# Patient Record
Sex: Female | Born: 1980
Health system: Southern US, Community
[De-identification: ages and names within clinical notes are randomized; demographics above are authoritative.]

## PROBLEM LIST (undated history)

## (undated) DIAGNOSIS — R0600 Dyspnea, unspecified: Secondary | ICD-10-CM

## (undated) DIAGNOSIS — I10 Essential (primary) hypertension: Secondary | ICD-10-CM

## (undated) DIAGNOSIS — K469 Unspecified abdominal hernia without obstruction or gangrene: Secondary | ICD-10-CM

## (undated) HISTORY — DX: Essential (primary) hypertension: I10

## (undated) HISTORY — PX: HERNIA REPAIR: SHX51

---

## 2005-04-29 DIAGNOSIS — G4730 Sleep apnea, unspecified: Secondary | ICD-10-CM

## 2005-04-29 HISTORY — DX: Sleep apnea, unspecified: G47.30

## 2009-12-26 ENCOUNTER — Emergency Department (HOSPITAL_COMMUNITY): Admission: EM | Admit: 2009-12-26 | Discharge: 2009-12-26 | Payer: Self-pay | Admitting: Emergency Medicine

## 2011-04-30 HISTORY — PX: HERNIA REPAIR: SHX51

## 2011-12-25 ENCOUNTER — Encounter (HOSPITAL_COMMUNITY): Payer: Self-pay | Admitting: Emergency Medicine

## 2011-12-25 ENCOUNTER — Emergency Department (HOSPITAL_COMMUNITY)
Admission: EM | Admit: 2011-12-25 | Discharge: 2011-12-25 | Disposition: A | Discharge status: Home / Self Care | Payer: Medicaid Other | Attending: Emergency Medicine | Admitting: Emergency Medicine

## 2011-12-25 DIAGNOSIS — K047 Periapical abscess without sinus: Secondary | ICD-10-CM | POA: Insufficient documentation

## 2011-12-25 MED ORDER — PENICILLIN V POTASSIUM 500 MG PO TABS: 500.0000 mg | ORAL_TABLET | Freq: Three times a day (TID) | ORAL | Status: AC

## 2011-12-25 MED ORDER — HYDROCODONE-ACETAMINOPHEN 5-325 MG PO TABS
1.0000 | ORAL_TABLET | Freq: Once | ORAL | Status: AC
  Administered 2011-12-25: 1 via ORAL
  Filled 2011-12-25: qty 1

## 2011-12-25 MED ORDER — HYDROCODONE-ACETAMINOPHEN 5-325 MG PO TABS: 2.0000 | ORAL_TABLET | ORAL | Status: DC | PRN

## 2011-12-25 NOTE — ED Notes (Signed)
Pt has right sided facial swelling due to rotten tooth in back bottom right molar; dentist appt Monday. Reports she cannot eat and has been using warm compress at home. A&Ox3.

## 2011-12-25 NOTE — ED Provider Notes (Signed)
History     CSN: [id number]  Arrival date & time 12/25/11  8295   First MD Initiated Contact with Patient 12/25/11 2116      Chief Complaint  Patient presents with  . Facial Swelling   HPI  History provided by the patient. Patient is a 31 year old African American female with no significant PMH who presents with complaints of left lower dental pain and facial swelling. Patient states she has had off-and-on problems with her dentition especially left lower molars for over the past month. She had made an appointment with a dentist in [facility] area but unfortunately missed her appointment. She does report being rescheduled for this next coming Monday. Over the past 2 days she has had increasing pain to her left jaw and face. She also reports having some swelling of the face. She denies having any difficulty swallowing or breathing. She denies any fever, chills or sweats. She denies any swelling under the common. She has been using over-the-counter pain medications and Orajel without significant improvement. She denies any other aggravating or alleviating factors.     No past medical history on file.  No past surgical history on file.  No family history on file.  History  Substance Use Topics  . Smoking status: Not on file  . Smokeless tobacco: Not on file  . Alcohol Use: Not on file    OB History    Grav Para Term Preterm Abortions TAB SAB Ect Mult Living                  Review of Systems  Constitutional: Negative for fever and chills.  HENT: Positive for sore throat and dental problem. Negative for trouble swallowing and voice change.   Gastrointestinal: Negative for nausea.    Allergies  Review of patient's allergies indicates no known allergies.  Home Medications   Current Outpatient Rx  Name Route Sig Dispense Refill  . ALBUTEROL SULFATE HFA 108 (90 BASE) MCG/ACT IN AERS Inhalation Inhale 2 puffs into the lungs every 6 (six) hours as needed. For shortness of  breath    . BENZOCAINE 10 % MT GEL Mouth/Throat Use as directed 1 application in the mouth or throat as needed. For tooth and swelling      BP 136/83  Pulse 83  Temp 98.3 F (36.8 C)  Resp 20  SpO2 98%  LMP 12/09/2011  Physical Exam  Nursing note and vitals reviewed. Constitutional: She is oriented to person, place, and time. She appears well-developed and well-nourished. No distress.  HENT:  Head: Normocephalic.  Mouth/Throat:         There are several molars throughout the mouth with decay to the gumline. There is old partial fracture to the left lower first molar with filling over the medial aspect. No significant swelling of the adjacent gum tissue or fluctuance. There is slight swelling of the left face with asymmetry.  Neck: Normal range of motion. Neck supple.  Cardiovascular: Normal rate and regular rhythm.   Pulmonary/Chest: Effort normal and breath sounds normal.  Lymphadenopathy:    She has no cervical adenopathy.  Neurological: She is alert and oriented to person, place, and time.  Skin: Skin is warm and dry. No rash noted.  Psychiatric: She has a normal mood and affect. Her behavior is normal.    ED Course  Procedures       1. Periapical abscess       MDM  9:28 PM patient seen and evaluated. Patient with slight  asymmetry and swelling of lower jaw. There is tenderness to percussion over left lower first molar. No significant swelling or fluctuance of adjacent gums or drainable abscess.        Angus Seller, [place] 12/25/11 2217

## 2011-12-25 NOTE — ED Notes (Signed)
PA at bedside.

## 2011-12-25 NOTE — ED Provider Notes (Signed)
Medical screening examination/treatment/procedure(s) were performed by non-physician practitioner and as supervising physician I was immediately available for consultation/collaboration.   Gwyneth Sprout, MD 12/25/11 2246

## 2011-12-27 ENCOUNTER — Encounter (HOSPITAL_COMMUNITY): Payer: Self-pay | Admitting: Emergency Medicine

## 2011-12-27 ENCOUNTER — Emergency Department (HOSPITAL_COMMUNITY)
Admission: EM | Admit: 2011-12-27 | Discharge: 2011-12-27 | Disposition: A | Discharge status: Home / Self Care | Payer: Medicaid Other | Attending: Emergency Medicine | Admitting: Emergency Medicine

## 2011-12-27 DIAGNOSIS — K047 Periapical abscess without sinus: Secondary | ICD-10-CM | POA: Insufficient documentation

## 2011-12-27 DIAGNOSIS — R112 Nausea with vomiting, unspecified: Secondary | ICD-10-CM | POA: Insufficient documentation

## 2011-12-27 MED ORDER — OXYCODONE-ACETAMINOPHEN 5-325 MG PO TABS
1.0000 | ORAL_TABLET | Freq: Once | ORAL | Status: AC
  Administered 2011-12-27: 1 via ORAL
  Filled 2011-12-27: qty 1

## 2011-12-27 MED ORDER — ONDANSETRON HCL 4 MG PO TABS: 4.0000 mg | ORAL_TABLET | Freq: Four times a day (QID) | ORAL | Status: AC

## 2011-12-27 NOTE — ED Notes (Signed)
Pt states she was seen in ED for tooth pain/infection 12/25/11, she was given Rx for penicillin 500mg  TID. Pt filled Rx yesterday 12/26/11 and took 2 doses, yesterday evening and last night. Pt became nauseated this AM around 0600 and vomited x1 around 0700 after eating a fruit cup. Pt concerned the medication made her vomit.

## 2011-12-27 NOTE — ED Provider Notes (Signed)
History     CSN: [id number]  Arrival date & time 12/27/11  0809   First MD Initiated Contact with Patient 12/27/11 [phone]      Chief Complaint  Patient presents with  . Medication Reaction    (Consider location/radiation/quality/duration/timing/severity/associated sxs/prior treatment) HPI  31 year old female presents for evaluations of the medication reaction. Patient was seen 2 days ago in the ED for evaluations of left lower dental pain and facial swelling. She was diagnosed with having a periapical abscess and was prescribed Norco, and penicillin with referral to a dentist, Dr. Lucky Cowboy.  Pt reports she took her antibiotic twice yesterday.  This am while eating her fruit cup pt felt nauseated and proceed to vomit one time.  Sts she notice trace of blood in her vomitus which concerns her.  Pt denies lightheadedness, dizziness, throat swelling, hives, itch, cp, sob or rash.  Has no prior hx of penicillin allergy.  Have not taken her prescribed pain medication yet.  Denies any other medication changes.  Pt concern that the medication may caused her to vomit.    No past medical history on file.  No past surgical history on file.  No family history on file.  History  Substance Use Topics  . Smoking status: Not on file  . Smokeless tobacco: Not on file  . Alcohol Use: Not on file    OB History    Grav Para Term Preterm Abortions TAB SAB Ect Mult Living                  Review of Systems  All other systems reviewed and are negative.    Allergies  Review of patient's allergies indicates no known allergies.  Home Medications   Current Outpatient Rx  Name Route Sig Dispense Refill  . ALBUTEROL SULFATE HFA 108 (90 BASE) MCG/ACT IN AERS Inhalation Inhale 2 puffs into the lungs every 6 (six) hours as needed. For shortness of breath    . BENZOCAINE 10 % MT GEL Mouth/Throat Use as directed 1 application in the mouth or throat as needed. For tooth and swelling    .  HYDROCODONE-ACETAMINOPHEN 5-325 MG PO TABS Oral Take 2 tablets by mouth every 4 (four) hours as needed for pain. 20 tablet 0  . PENICILLIN V POTASSIUM 500 MG PO TABS Oral Take 1 tablet (500 mg total) by mouth 3 (three) times daily. 30 tablet 0    BP 160/93  Pulse 83  Temp 98.5 F (36.9 C) (Oral)  Resp 20  SpO2 97%  LMP 12/09/2011  Physical Exam  Nursing note and vitals reviewed. Constitutional: She is oriented to person, place, and time. She appears well-developed and well-nourished.       Morbidly obese  HENT:  Head: Normocephalic and atraumatic.  Right Ear: External ear normal.  Left Ear: External ear normal.  Nose: Nose normal.  Mouth/Throat: Oropharynx is clear and moist.       Tenderness to left lower molar and premolar on percussion. Temporary filling noted in left molar. No significant erythema or swelling noted to gum line.    No evidence of peritonsillar abscess, or Ludwig's angina. Mild trismus but no drooling.  Left lower face is mildly edematous without fluctuance.   Neck:       Left anterior cervical lymphadenopathy, tenderness on palpation  Cardiovascular: Normal rate and regular rhythm.   Pulmonary/Chest: Effort normal and breath sounds normal. No respiratory distress. She has no wheezes. She has no rales.  Neurological: She is alert  and oriented to person, place, and time.  Skin: Skin is warm. No rash noted.  Psychiatric: She has a normal mood and affect.    ED Course  Procedures (including critical care time)  Labs Reviewed - No data to display No results found.   No diagnosis found.  1. Dental pain  MDM  Patient took 2 doses of penicillin yesterday and today she developed nausea, one bout of vomiting, and reports seeing blood in her vomitus.  She does have some evidence consistence with periapical abscess to the left lower jaw. She exhibits no obvious rash, hives, or respiratory involvement. No obvious source of bleeding noted.    Will give pain  medication. If patient is able to tolerates by mouth, will refer to dentist, Dr. Lucky Cowboy, for further management of her dental pain. I recommend taking antibiotic with food.  9:18 AM Pt able to tolerates PO, plan to have pt f/u with dentist.  Pt voice understanding and agrees with plan.    BP 160/93  Pulse 83  Temp 98.5 F (36.9 C) (Oral)  Resp 20  SpO2 97%  LMP 12/09/2011       Fayrene Helper, PA-C 12/27/11 [phone]

## 2011-12-27 NOTE — ED Provider Notes (Signed)
Medical screening examination/treatment/procedure(s) were performed by non-physician practitioner and as supervising physician I was immediately available for consultation/collaboration.  Aalivia Mcgraw L Laneice Meneely, MD 12/27/11 1647 

## 2014-10-04 ENCOUNTER — Emergency Department (HOSPITAL_COMMUNITY): Payer: Medicaid Other

## 2014-10-04 ENCOUNTER — Encounter (HOSPITAL_COMMUNITY): Payer: Self-pay | Admitting: Physical Medicine and Rehabilitation

## 2014-10-04 ENCOUNTER — Emergency Department (HOSPITAL_COMMUNITY)
Admission: EM | Admit: 2014-10-04 | Discharge: 2014-10-04 | Disposition: A | Discharge status: Home / Self Care | Payer: Medicaid Other | Attending: Emergency Medicine | Admitting: Emergency Medicine

## 2014-10-04 DIAGNOSIS — M25511 Pain in right shoulder: Secondary | ICD-10-CM

## 2014-10-04 DIAGNOSIS — Y9389 Activity, other specified: Secondary | ICD-10-CM | POA: Insufficient documentation

## 2014-10-04 DIAGNOSIS — S3992XA Unspecified injury of lower back, initial encounter: Secondary | ICD-10-CM | POA: Insufficient documentation

## 2014-10-04 DIAGNOSIS — Y998 Other external cause status: Secondary | ICD-10-CM | POA: Insufficient documentation

## 2014-10-04 DIAGNOSIS — Z79899 Other long term (current) drug therapy: Secondary | ICD-10-CM | POA: Insufficient documentation

## 2014-10-04 DIAGNOSIS — S4991XA Unspecified injury of right shoulder and upper arm, initial encounter: Secondary | ICD-10-CM | POA: Insufficient documentation

## 2014-10-04 DIAGNOSIS — Y9289 Other specified places as the place of occurrence of the external cause: Secondary | ICD-10-CM | POA: Insufficient documentation

## 2014-10-04 DIAGNOSIS — Z8719 Personal history of other diseases of the digestive system: Secondary | ICD-10-CM | POA: Insufficient documentation

## 2014-10-04 DIAGNOSIS — W010XXA Fall on same level from slipping, tripping and stumbling without subsequent striking against object, initial encounter: Secondary | ICD-10-CM | POA: Insufficient documentation

## 2014-10-04 DIAGNOSIS — Z720 Tobacco use: Secondary | ICD-10-CM | POA: Insufficient documentation

## 2014-10-04 HISTORY — DX: Unspecified abdominal hernia without obstruction or gangrene: K46.9

## 2014-10-04 MED ORDER — OXYCODONE-ACETAMINOPHEN 5-325 MG PO TABS
1.0000 | ORAL_TABLET | Freq: Once | ORAL | Status: AC
  Administered 2014-10-04: 1 via ORAL
  Filled 2014-10-04: qty 1

## 2014-10-04 MED ORDER — NAPROXEN 500 MG PO TABS: 500.0000 mg | ORAL_TABLET | Freq: Two times a day (BID) | ORAL | Status: DC

## 2014-10-04 MED ORDER — HYDROCODONE-ACETAMINOPHEN 5-325 MG PO TABS: 1.0000 | ORAL_TABLET | Freq: Four times a day (QID) | ORAL | Status: DC | PRN

## 2014-10-04 NOTE — Discharge Instructions (Signed)

## 2014-10-04 NOTE — ED Notes (Signed)
Pt A&OX4, ambulatory at d/c with steady gait, NAD 

## 2014-10-04 NOTE — ED Notes (Signed)
Pt states she accidentally tripped and fell over shoe yesterday. Now reports R shoulder pain. No obvious deformity noted. 6/10 pain upon arrival to ED.

## 2014-10-04 NOTE — ED Provider Notes (Signed)
CSN: [id number]     Arrival date & time 10/04/14  1518 History   This chart was scribed for non-physician practitioner, Felicie Mornavid Yisell Sprunger, NP working with Derwood KaplanAnkit Nanavati, MD by Gwenyth Oberatherine Macek, ED scribe. This patient was seen in room TR04C/TR04C and the patient's care was started at 4:13 PM     Chief Complaint  Patient presents with  . Shoulder Pain   The history is provided by the patient. No language interpreter was used.   HPI Comments: Victoria Chavez is a 34 y.o. female who presents to the Emergency Department complaining of constant, moderate right shoulder and clavicular pain that started yesterday. Pt states decreased ROM due to pain. She notes symptoms become worse with coughing. Pt reports that the onset of pain started after she tripped on her shoe and landed on the affected shoulder. 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 Pt denies numbness and tingling as associated symptoms.  Past Medical History  Diagnosis Date  . Abdominal hernia    History reviewed. No pertinent past surgical history. No family history on file. History  Substance Use Topics  . Smoking status: Current Every Day Smoker  . Smokeless tobacco: Not on file  . Alcohol Use: Yes   OB History    No data available     Review of Systems  Musculoskeletal: Positive for back pain and arthralgias.  Skin: Negative for wound.  Neurological: Negative for numbness.  All other systems reviewed and are negative.     Allergies  Review of patient's allergies indicates no known allergies.  Home Medications   Prior to Admission medications   Medication Sig Start Date End Date Taking? Authorizing Provider  albuterol (PROVENTIL HFA;VENTOLIN HFA) 108 (90 BASE) MCG/ACT inhaler Inhale 2 puffs into the lungs every 6 (six) hours as needed. For shortness of breath    Historical Provider, MD  ibuprofen (ADVIL,MOTRIN) 200 MG tablet Take 800 mg by mouth every 6 (six) hours as needed. For pain     Historical Provider, MD  Multiple Vitamin (MULTIVITAMIN WITH MINERALS) TABS Take 1 tablet by mouth daily.    Historical Provider, MD   BP 140/105 mmHg  Pulse 106  Temp(Src) 98.7 F (37.1 C) (Oral)  Resp 18  SpO2 94% Physical Exam  Constitutional: She appears well-developed and well-nourished. No distress.  HENT:  Head: Normocephalic and atraumatic.  Eyes: Conjunctivae and EOM are normal.  Neck: Neck supple. No tracheal deviation present.  Cardiovascular: Normal rate.   Pulmonary/Chest: Effort normal. No respiratory distress.  Musculoskeletal: She exhibits tenderness.  Anterior shoulder discomfort along with clavicular tenderness  Skin: Skin is warm and dry.  Psychiatric: She has a normal mood and affect. Her behavior is normal.  Nursing note and vitals reviewed.   ED Course  Procedures   DIAGNOSTIC STUDIES: Oxygen Saturation is 94% on RA, adequate by my interpretation.    COORDINATION OF CARE: 4:28 PM Discussed x-ray results and treatment plan with pt which includes anti-inflammatories, ice and sling for support. Pt agreed to plan.  Labs Review Labs Reviewed - No data to display  Imaging Review Dg Shoulder Right  10/04/2014   CLINICAL DATA:  Tripped over a shoe and fell yesterday, RIGHT shoulder pain  EXAM: RIGHT SHOULDER - 2+ VIEW  COMPARISON:  None  FINDINGS: Osseous mineralization normal.  AC joint alignment normal.  No acute fracture, dislocation or bone destruction.  Visualized LEFT ribs intact.  IMPRESSION: No acute osseous abnormalities.   Electronically Signed  By: Ulyses Southward M.D.   On: 10/04/2014 16:17     EKG Interpretation None     Radiology results reviewed and shared with patient.  No limitation in ROM.  Normal strength.  Distal pulses/sensation intact. MDM   Final diagnoses:  None    Shoulder pain. Sling. Anti-inflammatory. Norco for severe pain. Follow-up with orthopedics if no improvement.  I personally performed the services described in  this documentation, which was scribed in my presence. The recorded information has been reviewed and is accurate.    Felicie Morn, NP 10/04/14 1610  Derwood Kaplan, MD 10/06/14 [phone]

## 2016-03-01 ENCOUNTER — Encounter (HOSPITAL_COMMUNITY): Payer: Self-pay | Admitting: Emergency Medicine

## 2016-03-01 ENCOUNTER — Emergency Department (HOSPITAL_COMMUNITY)
Admission: EM | Admit: 2016-03-01 | Discharge: 2016-03-01 | Disposition: A | Discharge status: Home / Self Care | Payer: Medicaid Other | Attending: Emergency Medicine | Admitting: Emergency Medicine

## 2016-03-01 DIAGNOSIS — H1032 Unspecified acute conjunctivitis, left eye: Secondary | ICD-10-CM | POA: Insufficient documentation

## 2016-03-01 DIAGNOSIS — F17210 Nicotine dependence, cigarettes, uncomplicated: Secondary | ICD-10-CM | POA: Insufficient documentation

## 2016-03-01 MED ORDER — FLUORESCEIN SODIUM 1 MG OP STRP
1.0000 | ORAL_STRIP | Freq: Once | OPHTHALMIC | Status: AC
Start: 2016-03-01 — End: 2016-03-01
  Administered 2016-03-01: 1 via OPHTHALMIC
  Filled 2016-03-01: qty 1

## 2016-03-01 MED ORDER — POLYMYXIN B-TRIMETHOPRIM 10000-0.1 UNIT/ML-% OP SOLN: 1.0000 [drp] | OPHTHALMIC | 0 refills | Status: DC

## 2016-03-01 MED ORDER — TETRACAINE HCL 0.5 % OP SOLN
1.0000 [drp] | Freq: Once | OPHTHALMIC | Status: AC
  Administered 2016-03-01: 1 [drp] via OPHTHALMIC
  Filled 2016-03-01: qty 2

## 2016-03-01 NOTE — ED Notes (Signed)
Coupon given to patient.

## 2016-03-01 NOTE — ED Provider Notes (Signed)
[facility]-EMERGENCY DEPT Provider Note    By signing my name below, I, Earmon PhoenixJennifer Waddell, attest that this documentation has been prepared under the direction and in the presence of Rob WestburyBrowning, [place] JerseyPA-C. Electronically Signed: Earmon PhoenixJennifer Waddell, ED Scribe. 03/01/16. 2:20 PM.    History   Chief Complaint Chief Complaint  Patient presents with  . Eye Problem    The history is provided by the patient and medical records. No language interpreter was used.    HPI Comments:  Victoria Chavez is a 35 y.o. female who presents to the Emergency Department complaining of left eye itching and burning that began three days ago. She reports associated watering of the eye and redness. Denies any flashing lights or floaters. She has not done anything to treat her symptoms. She denies modifying factors. She denies any known foreign bodies in the eye. She denies contact with people with similar symptoms. She denies matting or crusting of the left eye, loss of vision, fever, chills, vomiting. She denies wearing corrective lenses or contact lenses.    Past Medical History:  Diagnosis Date  . Abdominal hernia     There are no active problems to display for this patient.   Past Surgical History:  Procedure Laterality Date  . HERNIA REPAIR      OB History    No data available       Home Medications    Prior to Admission medications   Medication Sig Start Date End Date Taking? Authorizing Provider  albuterol (PROVENTIL HFA;VENTOLIN HFA) 108 (90 BASE) MCG/ACT inhaler Inhale 2 puffs into the lungs every 6 (six) hours as needed. For shortness of breath    Historical Provider, MD  HYDROcodone-acetaminophen (NORCO/VICODIN) 5-325 MG per tablet Take 1 tablet by mouth every 6 (six) hours as needed for severe pain. 10/04/14   Felicie Mornavid Smith, NP  ibuprofen (ADVIL,MOTRIN) 200 MG tablet Take 800 mg by mouth every 6 (six) hours as needed. For pain    Historical Provider, MD  Multiple Vitamin (MULTIVITAMIN WITH  MINERALS) TABS Take 1 tablet by mouth daily.    Historical Provider, MD  naproxen (NAPROSYN) 500 MG tablet Take 1 tablet (500 mg total) by mouth 2 (two) times daily. 10/04/14   Felicie Mornavid Smith, NP    Family History History reviewed. No pertinent family history.  Social History Social History  Substance Use Topics  . Smoking status: Current Every Day Smoker    Packs/day: 0.50    Types: Cigarettes  . Smokeless tobacco: Never Used  . Alcohol use Yes     Allergies   Review of patient's allergies indicates no known allergies.   Review of Systems Review of Systems  Constitutional: Negative for chills and fever.  Eyes: Positive for pain, discharge and redness. Negative for visual disturbance.  Gastrointestinal: Negative for vomiting.     Physical Exam Updated Vital Signs BP (!) 182/114 (BP Location: Left Wrist)   Pulse 95   Temp 98 F (36.7 C) (Oral)   Resp 18   SpO2 98%   Physical Exam  Constitutional: She is oriented to person, place, and time. She appears well-developed and well-nourished.  HENT:  Head: Normocephalic and atraumatic.  Eyes:  Eye Pressures: R 21 L 20  Mild conjunctival erythema, clear watery discharge Normal EOMs No fluorescein uptake No corneal abrasion No FB   Right Eye Distance: 20/30 Left Eye Distance: 20/40 Bilateral Distance: 20/30   Neck: Normal range of motion.  Cardiovascular: Normal rate.   Pulmonary/Chest: Effort normal.  Musculoskeletal: Normal range of motion.  Neurological: She is alert and oriented to person, place, and time.  Skin: Skin is warm and dry.  Psychiatric: She has a normal mood and affect. Her behavior is normal.  Nursing note and vitals reviewed.    ED Treatments / Results  DIAGNOSTIC STUDIES: Oxygen Saturation is 98% on RA, normal by my interpretation.   COORDINATION OF CARE: 2:01 PM- Will check eye pressures and slit lamp exam. Pt verbalizes understanding and agrees to plan.  Medications - No data to  display  Labs (all labs ordered are listed, but only abnormal results are displayed) Labs Reviewed - No data to display  EKG  EKG Interpretation None       Radiology No results found.  Procedures Procedures (including critical care time)  Medications Ordered in ED Medications - No data to display   Initial Impression / Assessment and Plan / ED Course  I have reviewed the triage vital signs and the nursing notes.  Pertinent labs & imaging results that were available during my care of the patient were reviewed by me and considered in my medical decision making (see chart for details).  Clinical Course    Patient with left eye conjunctivitis.  No FB.  No corneal abrasion.  Upper limit of normal pressures.  Recommend ophthalmology follow-up.  Polytrim.  I personally performed the services described in this documentation, which was scribed in my presence. The recorded information has been reviewed and is accurate.     Final Clinical Impressions(s) / ED Diagnoses   Final diagnoses:  Acute conjunctivitis of left eye, unspecified acute conjunctivitis type    New Prescriptions New Prescriptions   TRIMETHOPRIM-POLYMYXIN B (POLYTRIM) OPHTHALMIC SOLUTION    Place 1 drop into both eyes every 4 (four) hours.     Roxy Horsemanobert Madelyne Millikan, PA-C 03/01/16 1432    Tilden FossaElizabeth Rees, MD 03/03/16 [phone]

## 2016-03-01 NOTE — ED Notes (Signed)
Case management bringing application for orange card for patient.

## 2016-03-01 NOTE — ED Triage Notes (Addendum)
Pt from home with c/o redness, swelling, watering, and burning, mild blurry vision x 3 days to left eye.  NAD, A&O.

## 2016-04-12 ENCOUNTER — Encounter (HOSPITAL_COMMUNITY): Payer: Self-pay

## 2016-04-12 ENCOUNTER — Emergency Department (HOSPITAL_COMMUNITY)
Admission: EM | Admit: 2016-04-12 | Discharge: 2016-04-12 | Disposition: A | Discharge status: Home / Self Care | Payer: Medicaid Other | Attending: Emergency Medicine | Admitting: Emergency Medicine

## 2016-04-12 DIAGNOSIS — L03213 Periorbital cellulitis: Secondary | ICD-10-CM

## 2016-04-12 DIAGNOSIS — H05011 Cellulitis of right orbit: Secondary | ICD-10-CM | POA: Insufficient documentation

## 2016-04-12 DIAGNOSIS — F17210 Nicotine dependence, cigarettes, uncomplicated: Secondary | ICD-10-CM | POA: Insufficient documentation

## 2016-04-12 DIAGNOSIS — Z79899 Other long term (current) drug therapy: Secondary | ICD-10-CM | POA: Insufficient documentation

## 2016-04-12 MED ORDER — SULFAMETHOXAZOLE-TRIMETHOPRIM 800-160 MG PO TABS: 1.0000 | ORAL_TABLET | Freq: Two times a day (BID) | ORAL | 0 refills | Status: AC

## 2016-04-12 MED ORDER — CEPHALEXIN 500 MG PO CAPS: 500.0000 mg | ORAL_CAPSULE | Freq: Four times a day (QID) | ORAL | 0 refills | Status: DC

## 2016-04-12 MED ORDER — FLUORESCEIN SODIUM 0.6 MG OP STRP
1.0000 | ORAL_STRIP | Freq: Once | OPHTHALMIC | Status: AC
  Administered 2016-04-12: 1 via OPHTHALMIC
  Filled 2016-04-12: qty 1

## 2016-04-12 MED ORDER — POLYMYXIN B-TRIMETHOPRIM 10000-0.1 UNIT/ML-% OP SOLN: 1.0000 [drp] | OPHTHALMIC | 0 refills | Status: DC

## 2016-04-12 MED ORDER — TETRACAINE HCL 0.5 % OP SOLN
2.0000 [drp] | Freq: Once | OPHTHALMIC | Status: AC
  Administered 2016-04-12: 2 [drp] via OPHTHALMIC
  Filled 2016-04-12: qty 2

## 2016-04-12 NOTE — ED Notes (Addendum)
Assisted Aram Beechamynthia Banker(RN) in Visual Acuity Screening

## 2016-04-12 NOTE — ED Triage Notes (Signed)
Pt presents to ED for evaluation of possible eye infection in R eye. Pt. Seen here for same approx 2 weeks ago in L eye. Pt. Was given abx drops and told to put in both eyes to prevent infection. L eye has resolved, now R eye has developed redness to lateral aspect and pt has had blurred vision intermittently.

## 2016-04-12 NOTE — ED Notes (Signed)
Obtained the Tonopen for Joy (PA)

## 2016-04-12 NOTE — Discharge Instructions (Signed)
There are signs of an infection to the eye that may have spread to the area around the eye. Take 500 mg Keflex 4 times a day for the next 5 days. Take the Bactrim twice a day for the next 5 days. Apply 1 drop of the Polytrim to the right eye every 4 hours for the next 7 days. Follow-up with ophthalmology as soon as possible on this issue for continued management. Return to the ED should any symptoms worsen.

## 2016-04-12 NOTE — ED Provider Notes (Signed)
Medical screening examination/treatment/procedure(s) were conducted as a shared visit with non-physician practitioner(s) and myself.  I personally evaluated the patient during the encounter.   EKG Interpretation None      Patient seen by me along with physician assistant. Patient 1 week ago was treated for conjunctivitis in the left eye. Now has complaint of similar but worse symptoms in the right eye. Patient with scleral redness swelling to the upper lid and slight swelling to the lower lid. Tenderness to touch on these areas. However a few open her eyelids and maintain steady pressure and then have her move her eyes left right upper and down no increased pain. I feel that the discomfort is predominantly on the outside and is consistent with a periorbital cellulitis and not an orbital cellulitis. Precautions will be given and patient will be treated as if it's a periorbital cellulitis.   Vanetta MuldersScott Latonia Conrow, MD 04/12/16 1345

## 2016-04-12 NOTE — ED Provider Notes (Signed)
[facility]-EMERGENCY DEPT Provider Note   CSN: [id number] Arrival date & time: 04/12/16  1121   By signing my name below, I, [name], attest that this documentation has been prepared under the direction and in the presence of Shawn Joy, PA-C. Electronically Signed: [name], ED Scribe. 04/12/2016. 12:26 PM.   History   Chief Complaint Chief Complaint  Patient presents with  . Eye Problem    The history is provided by the patient. No language interpreter was used.   HPI Comments:  Victoria Chavez is a 35 y.o. female who presents to the Emergency Department complaining of constant right eye pain and redness x 2 weeks.  Patient had previous history of irritation in the left eye 2 weeks ago, was treated with Polytrim, and symptoms resolved. Her pain in the right eye is more intense and has the added symptom of right eyelid pain. Denies fever/chills, eye discharge, visual acuity changes, known trauma, or any other complaints.     Past Medical History:  Diagnosis Date  . Abdominal hernia     There are no active problems to display for this patient.   Past Surgical History:  Procedure Laterality Date  . HERNIA REPAIR      OB History    No data available       Home Medications    Prior to Admission medications   Medication Sig Start Date End Date Taking? Authorizing Provider  albuterol (PROVENTIL HFA;VENTOLIN HFA) 108 (90 BASE) MCG/ACT inhaler Inhale 2 puffs into the lungs every 6 (six) hours as needed. For shortness of breath    Historical Provider, MD  cephALEXin (KEFLEX) 500 MG capsule Take 1 capsule (500 mg total) by mouth 4 (four) times daily. 04/12/16   Shawn C Joy, PA-C  HYDROcodone-acetaminophen (NORCO/VICODIN) 5-325 MG per tablet Take 1 tablet by mouth every 6 (six) hours as needed for severe pain. 10/04/14   Felicie Mornavid Smith, NP  ibuprofen (ADVIL,MOTRIN) 200 MG tablet Take 800 mg by mouth every 6 (six) hours as needed. For pain    Historical Provider, MD    Multiple Vitamin (MULTIVITAMIN WITH MINERALS) TABS Take 1 tablet by mouth daily.    Historical Provider, MD  naproxen (NAPROSYN) 500 MG tablet Take 1 tablet (500 mg total) by mouth 2 (two) times daily. 10/04/14   Felicie Mornavid Smith, NP  sulfamethoxazole-trimethoprim (BACTRIM DS,SEPTRA DS) 800-160 MG tablet Take 1 tablet by mouth 2 (two) times daily. 04/12/16 04/19/16  Shawn C Joy, PA-C  trimethoprim-polymyxin b (POLYTRIM) ophthalmic solution Place 1 drop into both eyes every 4 (four) hours. 03/01/16   Roxy Horsemanobert Browning, PA-C  trimethoprim-polymyxin b (POLYTRIM) ophthalmic solution Place 1 drop into the right eye every 4 (four) hours. Use for 7 days 04/12/16   Anselm PancoastShawn C Joy, PA-C    Family History No family history on file.  Social History Social History  Substance Use Topics  . Smoking status: Current Every Day Smoker    Packs/day: 0.50    Types: Cigarettes  . Smokeless tobacco: Never Used  . Alcohol use Yes     Allergies   Patient has no known allergies.   Review of Systems Review of Systems  Constitutional: Negative for fever.  Eyes: Positive for pain and redness. Negative for visual disturbance.     Physical Exam Updated Vital Signs BP 177/100 (BP Location: Left Arm)   Pulse 84   Temp 97.9 F (36.6 C) (Oral)   Resp 18   Ht 5\' 7"  (1.702 m)   Wt Marland Kitchen(!)  330 lb (149.7 kg)   LMP 03/13/2016 (Exact Date)   SpO2 100%   BMI 51.69 kg/m   Physical Exam  Constitutional: She appears well-developed and well-nourished. No distress.  HENT:  Head: Normocephalic and atraumatic.  No facial swelling noted.  Eyes: Conjunctivae and EOM are normal. Pupils are equal, round, and reactive to light.  Scleral injection in right eye with some upper lid erythema, edema and tenderness. EOMs are not painful.  No contact lenses in place.  Woods Lamp exam shows no increased uptake of fluorescein. Slit lamp exam was also performed with no noted signs of corneal abrasion or ulcer, iritis, anterior chamber  damage, foreign bodies, or globe damage.  Tono-Pen values: Right eye: 22  Left eye: 22    Visual Acuity  Right Eye Distance: 20/25 -1 Left Eye Distance: 20 /25 Bilateral Distance: 20/20 -2  Right Eye Near:   Left Eye Near:    Bilateral Near:      Neck: Neck supple.  Cardiovascular: Normal rate and regular rhythm.   Pulmonary/Chest: Effort normal.  Lymphadenopathy:    She has no cervical adenopathy.  Neurological: She is alert.  Skin: Skin is warm and dry. She is not diaphoretic.  Psychiatric: She has a normal mood and affect. Her behavior is normal.  Nursing note and vitals reviewed.    ED Treatments / Results  DIAGNOSTIC STUDIES:  Oxygen Saturation is 99% on RA, normal by my interpretation.    COORDINATION OF CARE:  12:26 PM Discussed treatment plan with pt at bedside and pt agreed to plan.   Labs (all labs ordered are listed, but only abnormal results are displayed) Labs Reviewed - No data to display  EKG  EKG Interpretation None       Radiology No results found.  Procedures Procedures (including critical care time)  Medications Ordered in ED Medications  fluorescein ophthalmic strip 1 strip (1 strip Right Eye Given 04/12/16 1236)  tetracaine (PONTOCAINE) 0.5 % ophthalmic solution 2 drop (2 drops Both Eyes Given 04/12/16 1237)     Initial Impression / Assessment and Plan / ED Course  I have reviewed the triage vital signs and the nursing notes.  Pertinent labs & imaging results that were available during my care of the patient were reviewed by me and considered in my medical decision making (see chart for details).  Clinical Course     Patient presents with right eye pain for the last 2 weeks. Suspect periorbital cellulitis. My suspicion for orbital cellulitis is low in this patient. She does not have any changes in her visual acuity, she does not have bilateral orbital or periorbital edema, and she is nontoxic appearing. Ophthalmology follow-up.  Strict return precautions given.  Vitals:   04/12/16 1151 04/12/16 1152 04/12/16 1318  BP:  177/100 (!) 155/103  Pulse:  84 84  Resp:  18 16  Temp:  97.9 F (36.6 C) 98.5 F (36.9 C)  TempSrc:  Oral Oral  SpO2:  100% 95%  Weight: (!) 149.7 kg    Height: 5\' 7"  (1.702 m)       Final Clinical Impressions(s) / ED Diagnoses   Final diagnoses:  Periorbital cellulitis of right eye    New Prescriptions Discharge Medication List as of 04/12/2016  2:01 PM    START taking these medications   Details  cephALEXin (KEFLEX) 500 MG capsule Take 1 capsule (500 mg total) by mouth 4 (four) times daily., Starting Fri 04/12/2016, Print    sulfamethoxazole-trimethoprim (BACTRIM DS,SEPTRA DS)  800-160 MG tablet Take 1 tablet by mouth 2 (two) times daily., Starting Fri 04/12/2016, Until Fri 04/19/2016, Print    !! trimethoprim-polymyxin b (POLYTRIM) ophthalmic solution Place 1 drop into the right eye every 4 (four) hours. Use for 7 days, Starting Fri 04/12/2016, Print     !! - Potential duplicate medications found. Please discuss with provider.     I personally performed the services described in this documentation, which was scribed in my presence. The recorded information has been reviewed and is accurate.     Anselm PancoastShawn C Joy, PA-C 04/12/16 1704    Vanetta MuldersScott Zackowski, MD 04/13/16 [phone]

## 2017-02-18 ENCOUNTER — Encounter (HOSPITAL_COMMUNITY): Payer: Self-pay | Admitting: *Deleted

## 2017-02-18 ENCOUNTER — Inpatient Hospital Stay (HOSPITAL_COMMUNITY)
Admission: AD | Admit: 2017-02-18 | Discharge: 2017-02-18 | Disposition: A | Discharge status: Home / Self Care | Payer: Self-pay | Source: Ambulatory Visit | Attending: Obstetrics and Gynecology | Admitting: Obstetrics and Gynecology

## 2017-02-18 DIAGNOSIS — F17210 Nicotine dependence, cigarettes, uncomplicated: Secondary | ICD-10-CM | POA: Insufficient documentation

## 2017-02-18 DIAGNOSIS — K439 Ventral hernia without obstruction or gangrene: Secondary | ICD-10-CM | POA: Diagnosis present

## 2017-02-18 DIAGNOSIS — Z720 Tobacco use: Secondary | ICD-10-CM | POA: Diagnosis present

## 2017-02-18 DIAGNOSIS — Z3202 Encounter for pregnancy test, result negative: Secondary | ICD-10-CM | POA: Insufficient documentation

## 2017-02-18 DIAGNOSIS — N939 Abnormal uterine and vaginal bleeding, unspecified: Secondary | ICD-10-CM | POA: Insufficient documentation

## 2017-02-18 DIAGNOSIS — I10 Essential (primary) hypertension: Secondary | ICD-10-CM | POA: Insufficient documentation

## 2017-02-18 DIAGNOSIS — K429 Umbilical hernia without obstruction or gangrene: Secondary | ICD-10-CM | POA: Insufficient documentation

## 2017-02-18 HISTORY — DX: Morbid (severe) obesity due to excess calories: E66.01

## 2017-02-18 LAB — CBC
HEMATOCRIT: 35.7 % — AB (ref 36.0–46.0)
HEMOGLOBIN: 11.4 g/dL — AB (ref 12.0–15.0)
MCH: 23.4 pg — ABNORMAL LOW (ref 26.0–34.0)
MCHC: 31.9 g/dL (ref 30.0–36.0)
MCV: 73.3 fL — ABNORMAL LOW (ref 78.0–100.0)
Platelets: 335 10*3/uL (ref 150–400)
RBC: 4.87 MIL/uL (ref 3.87–5.11)
RDW: 16.2 % — ABNORMAL HIGH (ref 11.5–15.5)
WBC: 10.2 10*3/uL (ref 4.0–10.5)

## 2017-02-18 LAB — URINALYSIS, ROUTINE W REFLEX MICROSCOPIC
BACTERIA UA: NONE SEEN
Bilirubin Urine: NEGATIVE
Glucose, UA: NEGATIVE mg/dL
Ketones, ur: NEGATIVE mg/dL
NITRITE: NEGATIVE
Protein, ur: 30 mg/dL — AB
SPECIFIC GRAVITY, URINE: 1.013 (ref 1.005–1.030)
pH: 6 (ref 5.0–8.0)

## 2017-02-18 LAB — WET PREP, GENITAL
CLUE CELLS WET PREP: NONE SEEN
SPERM: NONE SEEN
TRICH WET PREP: NONE SEEN
YEAST WET PREP: NONE SEEN

## 2017-02-18 LAB — POCT PREGNANCY, URINE: PREG TEST UR: NEGATIVE

## 2017-02-18 MED ORDER — MEGESTROL ACETATE 40 MG PO TABS: 40.0000 mg | ORAL_TABLET | Freq: Two times a day (BID) | ORAL | 0 refills | Status: DC

## 2017-02-18 NOTE — Discharge Instructions (Signed)
Umbilical Hernia, Adult A hernia is a bulge of tissue that pushes through an opening between muscles. An umbilical hernia happens in the abdomen, near the belly button (umbilicus). The hernia may contain tissues from the small intestine, large intestine, or fatty tissue covering the intestines (omentum). Umbilical hernias in adults tend to get worse over time, and they require surgical treatment. There are several types of umbilical hernias. You may have:  A hernia located just above or below the umbilicus (indirect hernia). This is the most common type of umbilical hernia in adults.  A hernia that forms through an opening formed by the umbilicus (direct hernia).  A hernia that comes and goes (reducible hernia). A reducible hernia may be visible only when you strain, lift something heavy, or cough. This type of hernia can be pushed back into the abdomen (reduced).  A hernia that traps abdominal tissue inside the hernia (incarcerated hernia). This type of hernia cannot be reduced.  A hernia that cuts off blood flow to the tissues inside the hernia (strangulated hernia). The tissues can start to die if this happens. This type of hernia requires emergency treatment.  What are the causes? An umbilical hernia happens when tissue inside the abdomen presses on a weak area of the abdominal muscles. What increases the risk? You may have a greater risk of this condition if you:  Are obese.  Have had several pregnancies.  Have a buildup of fluid inside your abdomen (ascites).  Have had surgery that weakens the abdominal muscles.  What are the signs or symptoms? The main symptom of this condition is a painless bulge at or near the belly button. A reducible hernia may be visible only when you strain, lift something heavy, or cough. Other symptoms may include:  Dull pain.  A feeling of pressure.  Symptoms of a strangulated hernia may include:  Pain that gets increasingly worse.  Nausea and  vomiting.  Pain when pressing on the hernia.  Skin over the hernia becoming red or purple.  Constipation.  Blood in the stool.  How is this diagnosed? This condition may be diagnosed based on:  A physical exam. You may be asked to cough or strain while standing. These actions increase the pressure inside your abdomen and force the hernia through the opening in your muscles. Your health care provider may try to reduce the hernia by pressing on it.  Your symptoms and medical history.  How is this treated? Surgery is the only treatment for an umbilical hernia. Surgery for a strangulated hernia is done as soon as possible. If you have a small hernia that is not incarcerated, you may need to lose weight before having surgery. Follow these instructions at home:  Lose weight, if told by your health care provider.  Do not try to push the hernia back in.  Watch your hernia for any changes in color or size. Tell your health care provider if any changes occur.  You may need to avoid activities that increase pressure on your hernia.  Do not lift anything that is heavier than 10 lb (4.5 kg) until your health care provider says that this is safe.  Take over-the-counter and prescription medicines only as told by your health care provider.  Keep all follow-up visits as told by your health care provider. This is important. Contact a health care provider if:  Your hernia gets larger.  Your hernia becomes painful. Get help right away if:  You develop sudden, severe pain near the   area of your hernia.  You have pain as well as nausea or vomiting.  You have pain and the skin over your hernia changes color.  You develop a fever. This information is not intended to replace advice given to you by your health care provider. Make sure you discuss any questions you have with your health care provider. Document Released: 09/15/2015 Document Revised: 12/17/2015 Document Reviewed:  09/15/2015 Elsevier Interactive Patient Education  2018 [employer] Hypertension Hypertension is another name for high blood pressure. High blood pressure forces your heart to work harder to pump blood. This can cause problems over time. There are two numbers in a blood pressure reading. There is a top number (systolic) over a bottom number (diastolic). It is best to have a blood pressure below 120/80. Healthy choices can help lower your blood pressure. You may need medicine to help lower your blood pressure if:  Your blood pressure cannot be lowered with healthy choices.  Your blood pressure is higher than 130/80.  Follow these instructions at home: Eating and drinking  If directed, follow the DASH eating plan. This diet includes: ? Filling half of your plate at each meal with fruits and vegetables. ? Filling one quarter of your plate at each meal with whole grains. Whole grains include whole wheat pasta, brown rice, and whole grain bread. ? Eating or drinking low-fat dairy products, such as skim milk or low-fat yogurt. ? Filling one quarter of your plate at each meal with low-fat (lean) proteins. Low-fat proteins include fish, skinless chicken, eggs, beans, and tofu. ? Avoiding fatty meat, cured and processed meat, or chicken with skin. ? Avoiding premade or processed food.  Eat less than 1,500 mg of salt (sodium) a day.  Limit alcohol use to no more than 1 drink a day for nonpregnant women and 2 drinks a day for men. One drink equals 12 oz of beer, 5 oz of wine, or 1 oz of hard liquor. Lifestyle  Work with your doctor to stay at a healthy weight or to lose weight. Ask your doctor what the best weight is for you.  Get at least 30 minutes of exercise that causes your heart to beat faster (aerobic exercise) most days of the week. This may include walking, swimming, or biking.  Get at least 30 minutes of exercise that strengthens your muscles (resistance exercise) at least 3 days a  week. This may include lifting weights or pilates.  Do not use any products that contain nicotine or tobacco. This includes cigarettes and e-cigarettes. If you need help quitting, ask your doctor.  Check your blood pressure at home as told by your doctor.  Keep all follow-up visits as told by your doctor. This is important. Medicines  Take over-the-counter and prescription medicines only as told by your doctor. Follow directions carefully.  Do not skip doses of blood pressure medicine. The medicine does not work as well if you skip doses. Skipping doses also puts you at risk for problems.  Ask your doctor about side effects or reactions to medicines that you should watch for. Contact a doctor if:  You think you are having a reaction to the medicine you are taking.  You have headaches that keep coming back (recurring).  You feel dizzy.  You have swelling in your ankles.  You have trouble with your vision. Get help right away if:  You get a very bad headache.  You start to feel confused.  You feel weak or numb.  You  feel faint.  You get very bad pain in your: ? Chest. ? Belly (abdomen).  You throw up (vomit) more than once.  You have trouble breathing. Summary  Hypertension is another name for high blood pressure.  Making healthy choices can help lower blood pressure. If your blood pressure cannot be controlled with healthy choices, you may need to take medicine. This information is not intended to replace advice given to you by your health care provider. Make sure you discuss any questions you have with your health care provider. Document Released: 10/02/2007 Document Revised: 03/13/2016 Document Reviewed: 03/13/2016 Elsevier Interactive Patient Education  2018 [employer]. Abnormal Uterine Bleeding Abnormal uterine bleeding means bleeding more than usual from your uterus. It can include:  Bleeding between periods.  Bleeding after sex.  Bleeding that is  heavier than normal.  Periods that last longer than usual.  Bleeding after you have stopped having your period (menopause).  There are many problems that may cause this. You should see a doctor for any kind of bleeding that is not normal. Treatment depends on the cause of the bleeding. Follow these instructions at home:  Watch your condition for any changes.  Do not use tampons, douche, or have sex, if your doctor tells you not to.  Change your pads often.  Get regular well-woman exams. Make sure they include a pelvic exam and cervical cancer screening.  Keep all follow-up visits as told by your doctor. This is important. Contact a doctor if:  The bleeding lasts more than one week.  You feel dizzy at times.  You feel like you are going to throw up (nauseous).  You throw up. Get help right away if:  You pass out.  You have to change pads every hour.  You have belly (abdominal) pain.  You have a fever.  You get sweaty.  You get weak.  You passing large blood clots from your vagina. Summary  Abnormal uterine bleeding means bleeding more than usual from your uterus.  There are many problems that may cause this. You should see a doctor for any kind of bleeding that is not normal.  Treatment depends on the cause of the bleeding. This information is not intended to replace advice given to you by your health care provider. Make sure you discuss any questions you have with your health care provider. Document Released: 02/10/2009 Document Revised: 04/09/2016 Document Reviewed: 04/09/2016 Elsevier Interactive Patient Education  2017 [employer].

## 2017-02-18 NOTE — MAU Provider Note (Signed)
History     CSN: [id number]  Arrival date and time: 02/18/17 7829   First Provider Initiated Contact with Patient 02/18/17 1203      Chief Complaint  Patient presents with  . Abdominal Pain  . Vaginal Bleeding   Ms. Victoria Chavez is an obese 36 year old African American female (G0P0) with uncontrolled hypertension and a family history of maternal metastatic cervical cancer who reports to the MAU with the chief complaint of "bleeding down there in private area for last 11 days". Patient states bleeding has been moderate to heavy and has required multiple pads to control. Monthly cycles have remained roughly 28-30 days, but she states her bleeding days have been increasing from 3-4 to now 8-10 days per cycle. Patient states her age of menarche is early teens but could not specify age. Denies history of uterine fibroids, cysts, or polyps and states last pap smear was within the last 3 years and was normal. She denies worsening menstrual cramps with her monthly cycles. Patient is sexually active (unprotected vaginal intercourse with female partner last month (12/2016). Patient is not concerned about risk of STI and denies recently inserting any foreign objects into her vagina. She feels the paroxysmal abdominal pain she is having today is related to her surgically repaired (within last three years) umbilical hernia which she manages with PRN ibuprofen. She affirms daily bowel movements and denies nausea, vomiting, diarrhea, constipation, rectal bleeding, and history of hemorrhoids.   As stated earlier, patient reports mother died of metastatic cervical cancer at 52. Patient has 4 brothers and 4 sisters and denies knowledge of any other family medical conditions. Father is living ("late 54's) and his medical history is unknown by patient. Patient works currently at [employer] and denies any knowledge of any chemical exposures at work. Patient uses tobacco (11 pack-years of cigarettes, 0.5 ppd, started at  age 25), alcohol (2-4 drinks on weekends), and marijuana (current, daily usage). She denies usage of other illicit drugs.         OB History    Gravida Para Term Preterm AB Living   0 0 0 0 0 0   SAB TAB Ectopic Multiple Live Births   0 0 0 0 0      Past Medical History:  Diagnosis Date  . Abdominal hernia     Past Surgical History:  Procedure Laterality Date  . HERNIA REPAIR      History reviewed. No pertinent family history.  Social History  Substance Use Topics  . Smoking status: Current Every Day Smoker    Packs/day: 0.50    Types: Cigarettes  . Smokeless tobacco: Never Used  . Alcohol use Yes    Allergies: No Known Allergies  Prescriptions Prior to Admission  Medication Sig Dispense Refill Last Dose  . Ascorbic Acid (VITAMIN C PO) Take 2 each by mouth daily.   Past Week at Unknown time  . ibuprofen (ADVIL,MOTRIN) 200 MG tablet Take 600 mg by mouth every 6 (six) hours as needed for moderate pain. For pain    Past Week at Unknown time  . albuterol (PROVENTIL HFA;VENTOLIN HFA) 108 (90 BASE) MCG/ACT inhaler Inhale 2 puffs into the lungs every 6 (six) hours as needed. For shortness of breath   Not Taking at Unknown time    Review of Systems  Constitutional: Negative for chills, fatigue, fever and unexpected weight change (Patient acknowledges increase in weight is not unexpected).  HENT: Negative for nosebleeds.   Eyes: Negative for visual disturbance.  Respiratory:  Positive for apnea (Patient prescribed CPAP but does not use.). Negative for chest tightness and shortness of breath.   Cardiovascular: Negative for chest pain, palpitations and leg swelling.  Gastrointestinal: Positive for abdominal pain (Periumbilical pain. Related to hernia.). Negative for anal bleeding, blood in stool, constipation, diarrhea, nausea, rectal pain and vomiting.  Genitourinary: Positive for hematuria and vaginal bleeding. Negative for decreased urine volume, difficulty urinating,  dyspareunia, dysuria, flank pain, urgency and vaginal pain.  Musculoskeletal: Positive for back pain (End of day of work. Improves with rest.). Negative for arthralgias, joint swelling and myalgias.  Skin: Negative for color change and rash.  Neurological: Negative for dizziness, light-headedness, numbness and headaches. Seizures: Denied parasthesia.  Hematological: Negative for adenopathy. Does not bruise/bleed easily.   Physical Exam   Blood pressure (!) 149/84, pulse 76, temperature 98.3 F (36.8 C), temperature source Oral, resp. rate 19, height 5\' 7"  (1.702 m), weight (!) 156.5 kg (345 lb), last menstrual period 02/08/2017, SpO2 98 %.   Results for orders placed or performed during the hospital encounter of 02/18/17 (from the past 24 hour(s))  Urinalysis, Routine w reflex microscopic     Status: Abnormal   Collection Time: 02/18/17  9:50 AM  Result Value Ref Range   Color, Urine YELLOW YELLOW   APPearance HAZY (A) CLEAR   Specific Gravity, Urine 1.013 1.005 - 1.030   pH 6.0 5.0 - 8.0   Glucose, UA NEGATIVE NEGATIVE mg/dL   Hgb urine dipstick LARGE (A) NEGATIVE   Bilirubin Urine NEGATIVE NEGATIVE   Ketones, ur NEGATIVE NEGATIVE mg/dL   Protein, ur 30 (A) NEGATIVE mg/dL   Nitrite NEGATIVE NEGATIVE   Leukocytes, UA SMALL (A) NEGATIVE   RBC / HPF 6-30 0 - 5 RBC/hpf   WBC, UA TOO NUMEROUS TO COUNT 0 - 5 WBC/hpf   Bacteria, UA NONE SEEN NONE SEEN   Squamous Epithelial / LPF 0-5 (A) NONE SEEN   Mucus PRESENT   Pregnancy, urine POC     Status: None   Collection Time: 02/18/17  9:50 AM  Result Value Ref Range   Preg Test, Ur NEGATIVE NEGATIVE  Wet prep, genital     Status: Abnormal   Collection Time: 02/18/17 12:30 PM  Result Value Ref Range   Yeast Wet Prep HPF POC NONE SEEN NONE SEEN   Trich, Wet Prep NONE SEEN NONE SEEN   Clue Cells Wet Prep HPF POC NONE SEEN NONE SEEN   WBC, Wet Prep HPF POC MANY (A) NONE SEEN   Sperm NONE SEEN   CBC     Status: Abnormal   Collection Time:  02/18/17  1:14 PM  Result Value Ref Range   WBC 10.2 4.0 - 10.5 K/uL   RBC 4.87 3.87 - 5.11 MIL/uL   Hemoglobin 11.4 (L) 12.0 - 15.0 g/dL   HCT 16.1 (L) 09.6 - 04.5 %   MCV 73.3 (L) 78.0 - 100.0 fL   MCH 23.4 (L) 26.0 - 34.0 pg   MCHC 31.9 30.0 - 36.0 g/dL   RDW 40.9 (H) 81.1 - 91.4 %   Platelets 335 150 - 400 K/uL    Physical Exam  Constitutional:  Patient is morbidly obese AA female on a hospital bed wearing exam gown in no acute distress.    Physical exam conducted by Donette Larry.  MAU Course  Procedures  MDM Abnormal Uterine Bleeding Labs ordered (see above PE) Uncontrolled Hypertension Morbidly Obese Patient needs outpatient management (OBGYN, family, and GI surgeon referral for repeat umbilical  hernia repair) Tobacco and Marijuana Abuse Cessation  Assessment and Plan  1. Abnormal Uterine Bleeding, unknown etiology  A. Advise outpatient OBGYN follow up for assessment. Endometrial tissue biopsy and pap smear recommended.  B. Recommend counseling for birth control since patient is potential high-risk pregnancy candidate.  2. Hypertension, uncontrolled  A. Advise outpatient Family Doctor follow up for assessment and management. Include discussion about managing sleep apnea with CPAP.  3. Umbilical Hernia, relapse of previous repair  A. Advise GI surgical consult for repair. 4. Morbid Obesity  A. Advise family physician consult. Advise 120-150 minutes of exercise weekly. Recommend consult with dietician.   Huel CoteBrian W Bernish 02/18/2017, 1:17 PM   Midwife attestation I have seen and examined this patient and agree with above documentation in the student's note.  See MAU provider (my note) for more details.   Donette LarryMelanie Rodel Glaspy, CNM 3:21 PM

## 2017-02-18 NOTE — MAU Provider Note (Signed)
History     CSN: [id number]  Arrival date and time: 02/18/17 4098   First Provider Initiated Contact with Patient 02/18/17 1203      Chief Complaint  Patient presents with  . Abdominal Pain  . Vaginal Bleeding   36 y.o. Non-pregnant female here with prolonged VB and abdominal pain. She reports menses has lasted 11 days as of today. She cannot recall how many pads per day she is using but is on her "third bag" of pads since the bleeding started. She reports each menses over the last several months is becoming longer. She also reports ongoing abdominal pain at her umbilicus which she attributes to a hernia that was repaired in the past. She is sexually active, no new partner. No hx of STIs. She is unsure of her last pap smear. Her mother died in her 34s of metastatic cervical CA.    Past Medical History:  Diagnosis Date  . Abdominal hernia     Past Surgical History:  Procedure Laterality Date  . HERNIA REPAIR      History reviewed. No pertinent family history.  Social History  Substance Use Topics  . Smoking status: Current Every Day Smoker    Packs/day: 0.50    Types: Cigarettes  . Smokeless tobacco: Never Used  . Alcohol use Yes    Allergies: No Known Allergies  Prescriptions Prior to Admission  Medication Sig Dispense Refill Last Dose  . Ascorbic Acid (VITAMIN C PO) Take 2 each by mouth daily.   Past Week at Unknown time  . ibuprofen (ADVIL,MOTRIN) 200 MG tablet Take 600 mg by mouth every 6 (six) hours as needed for moderate pain. For pain    Past Week at Unknown time  . albuterol (PROVENTIL HFA;VENTOLIN HFA) 108 (90 BASE) MCG/ACT inhaler Inhale 2 puffs into the lungs every 6 (six) hours as needed. For shortness of breath   Not Taking at Unknown time    Review of Systems  Constitutional: Negative for chills and fever.  Gastrointestinal: Positive for abdominal pain. Negative for constipation and diarrhea.  Genitourinary: Positive for vaginal bleeding. Negative for  dysuria, frequency and urgency.   Physical Exam   Blood pressure (!) 149/84, pulse 76, temperature 98.3 F (36.8 C), temperature source Oral, resp. rate 19, height 5\' 7"  (1.702 m), weight (!) 345 lb (156.5 kg), last menstrual period 02/08/2017, SpO2 98 %.  Physical Exam  Constitutional: She is oriented to person, place, and time.  GI:    Genitourinary:  Genitourinary Comments: External: no lesions or erythema Vagina: rugated, pink, moist, small drk bloody discharge Uterus/adnexae: no CMT; difficult d/t body habitus  Musculoskeletal: Normal range of motion.  Neurological: She is alert and oriented to person, place, and time.  Skin: Skin is warm and dry.  Psychiatric: She has a normal mood and affect.   Results for orders placed or performed during the hospital encounter of 02/18/17 (from the past 24 hour(s))  Urinalysis, Routine w reflex microscopic     Status: Abnormal   Collection Time: 02/18/17  9:50 AM  Result Value Ref Range   Color, Urine YELLOW YELLOW   APPearance HAZY (A) CLEAR   Specific Gravity, Urine 1.013 1.005 - 1.030   pH 6.0 5.0 - 8.0   Glucose, UA NEGATIVE NEGATIVE mg/dL   Hgb urine dipstick LARGE (A) NEGATIVE   Bilirubin Urine NEGATIVE NEGATIVE   Ketones, ur NEGATIVE NEGATIVE mg/dL   Protein, ur 30 (A) NEGATIVE mg/dL   Nitrite NEGATIVE NEGATIVE   Leukocytes,  UA SMALL (A) NEGATIVE   RBC / HPF 6-30 0 - 5 RBC/hpf   WBC, UA TOO NUMEROUS TO COUNT 0 - 5 WBC/hpf   Bacteria, UA NONE SEEN NONE SEEN   Squamous Epithelial / LPF 0-5 (A) NONE SEEN   Mucus PRESENT   Pregnancy, urine POC     Status: None   Collection Time: 02/18/17  9:50 AM  Result Value Ref Range   Preg Test, Ur NEGATIVE NEGATIVE  Wet prep, genital     Status: Abnormal   Collection Time: 02/18/17 12:30 PM  Result Value Ref Range   Yeast Wet Prep HPF POC NONE SEEN NONE SEEN   Trich, Wet Prep NONE SEEN NONE SEEN   Clue Cells Wet Prep HPF POC NONE SEEN NONE SEEN   WBC, Wet Prep HPF POC MANY (A) NONE  SEEN   Sperm NONE SEEN   CBC     Status: Abnormal   Collection Time: 02/18/17  1:14 PM  Result Value Ref Range   WBC 10.2 4.0 - 10.5 K/uL   RBC 4.87 3.87 - 5.11 MIL/uL   Hemoglobin 11.4 (L) 12.0 - 15.0 g/dL   HCT 16.135.7 (L) 09.636.0 - 04.546.0 %   MCV 73.3 (L) 78.0 - 100.0 fL   MCH 23.4 (L) 26.0 - 34.0 pg   MCHC 31.9 30.0 - 36.0 g/dL   RDW 40.916.2 (H) 81.111.5 - 91.415.5 %   Platelets 335 150 - 400 K/uL   MAU Course  Procedures  MDM Labs ordered and reviewed. UA likely contaminated but will send UC. No evidence of acute abdominal or pelvic process. Hernia stable and can be evaluated by outpt PCP and needs mngt of HTN as well. Will treat AUB with Megace and order outpt pelvic US. Will need f/u in GYN clinic for further work-up, pap, and would consider EMBx. Stable for discharge home.   Assessment and Plan   1. Abnormal uterine bleeding (AUB)   2. Umbilical hernia without obstruction and without gangrene   3. Essential hypertension   4. Tobacco user    Discharge home Follow up with [facility] 1 week after US Follow up with [facility] in 1-2 weeks Outpt pelvic US in 1 week- US dept to schedule Rx Megace  Allergies as of 02/18/2017   No Known Allergies     Medication List    TAKE these medications   albuterol 108 (90 Base) MCG/ACT inhaler Commonly known as:  PROVENTIL HFA;VENTOLIN HFA Inhale 2 puffs into the lungs every 6 (six) hours as needed. For shortness of breath   ibuprofen 200 MG tablet Commonly known as:  ADVIL,MOTRIN Take 600 mg by mouth every 6 (six) hours as needed for moderate pain. For pain   megestrol 40 MG tablet Commonly known as:  MEGACE Take 1 tablet (40 mg total) by mouth 2 (two) times daily.   VITAMIN C PO Take 2 each by mouth daily.      Donette LarryMelanie Isaish Alemu, CNM 02/18/2017, 1:23 PM

## 2017-02-18 NOTE — MAU Note (Signed)
Pt reports she has had vaginal bleeding for 11 days. Off/on pain near her umbilicus (pt reports she has an umbilical hernia).

## 2017-02-18 NOTE — MAU Note (Signed)
Discharge instructions reviewed with patient.  Patient verbalized understanding, hard copy signed (e-signature disconnected). Copy placed with file.

## 2017-02-19 LAB — URINE CULTURE

## 2017-02-19 LAB — GC/CHLAMYDIA PROBE AMP (~~LOC~~) NOT AT ARMC
Chlamydia: POSITIVE — AB
NEISSERIA GONORRHEA: NEGATIVE

## 2017-02-21 ENCOUNTER — Other Ambulatory Visit: Payer: Self-pay | Admitting: Medical

## 2017-02-21 DIAGNOSIS — A749 Chlamydial infection, unspecified: Secondary | ICD-10-CM

## 2017-02-21 MED ORDER — AZITHROMYCIN 250 MG PO TABS: 1000.0000 mg | ORAL_TABLET | Freq: Once | ORAL | 0 refills | Status: AC

## 2017-02-22 ENCOUNTER — Other Ambulatory Visit: Payer: Self-pay | Admitting: Student

## 2017-02-22 ENCOUNTER — Telehealth: Payer: Self-pay | Admitting: Student

## 2017-02-22 DIAGNOSIS — A749 Chlamydial infection, unspecified: Secondary | ICD-10-CM

## 2017-02-22 MED ORDER — AZITHROMYCIN 500 MG PO TABS: 1000.0000 mg | ORAL_TABLET | Freq: Once | ORAL | 0 refills | Status: AC

## 2017-02-22 NOTE — Telephone Encounter (Signed)
Patient returned call. Verified by name & DOB. Notified of positive chlamydia results. Rx sent to pharmacy. No IC x 1 weeks. [facility].   Judeth HornLawrence, Naveah Brave, NP

## 2017-03-11 ENCOUNTER — Ambulatory Visit (HOSPITAL_COMMUNITY)
Admission: RE | Admit: 2017-03-11 | Discharge: 2017-03-11 | Disposition: A | Discharge status: Home / Self Care | Payer: Self-pay | Source: Ambulatory Visit | Attending: Certified Nurse Midwife | Admitting: Certified Nurse Midwife

## 2017-03-11 DIAGNOSIS — N939 Abnormal uterine and vaginal bleeding, unspecified: Secondary | ICD-10-CM | POA: Insufficient documentation

## 2019-05-17 ENCOUNTER — Emergency Department (HOSPITAL_COMMUNITY)
Admission: EM | Admit: 2019-05-17 | Discharge: 2019-05-17 | Disposition: A | Discharge status: Home / Self Care | Payer: Self-pay | Attending: Emergency Medicine | Admitting: Emergency Medicine

## 2019-05-17 ENCOUNTER — Encounter (HOSPITAL_COMMUNITY): Payer: Self-pay | Admitting: Emergency Medicine

## 2019-05-17 ENCOUNTER — Other Ambulatory Visit: Payer: Self-pay

## 2019-05-17 DIAGNOSIS — H1032 Unspecified acute conjunctivitis, left eye: Secondary | ICD-10-CM | POA: Insufficient documentation

## 2019-05-17 DIAGNOSIS — F17210 Nicotine dependence, cigarettes, uncomplicated: Secondary | ICD-10-CM | POA: Insufficient documentation

## 2019-05-17 MED ORDER — FLUORESCEIN SODIUM 1 MG OP STRP
1.0000 | ORAL_STRIP | Freq: Once | OPHTHALMIC | Status: AC
  Administered 2019-05-17: 1 via OPHTHALMIC
  Filled 2019-05-17: qty 1

## 2019-05-17 MED ORDER — ERYTHROMYCIN 5 MG/GM OP OINT: TOPICAL_OINTMENT | OPHTHALMIC | 0 refills | Status: DC

## 2019-05-17 MED ORDER — TETRACAINE HCL 0.5 % OP SOLN
2.0000 [drp] | Freq: Once | OPHTHALMIC | Status: AC
  Administered 2019-05-17: 2 [drp] via OPHTHALMIC
  Filled 2019-05-17: qty 4

## 2019-05-17 MED ORDER — KETOROLAC TROMETHAMINE 0.5 % OP SOLN: 1.0000 [drp] | Freq: Four times a day (QID) | OPHTHALMIC | 0 refills | Status: DC

## 2019-05-17 NOTE — ED Provider Notes (Signed)
[facility] EMERGENCY DEPARTMENT Provider Note   CSN: [id number] Arrival date & time: 05/17/19  7829     History Chief Complaint  Patient presents with  . Conjunctivitis    Victoria Chavez is a 39 y.o. female with no relevant PMH presents to the ED with a 3-day history of left eye redness, irritation, and itching.  Patient reports that she first noticed mild redness 3 days ago which has progressively become more uncomfortable and irritated.  This morning she woke up with her eye matted shut due to her discharge.  She denies any headache or dizziness, fevers, chills, visual impairment, pain with EOMs, pressure sensation behind the eye, periorbital swelling, or other symptoms.  She reports using ophthalmic cleaning drops, with little effect.  Patient does not wear contact lenses.  HPI     Past Medical History:  Diagnosis Date  . Abdominal hernia   . Morbid obesity [facility])     Patient Active Problem List   Diagnosis Date Noted  . Morbid obesity ([place]) 02/18/2017  . Umbilical hernia 56/21/3086  . Tobacco user 02/18/2017    Past Surgical History:  Procedure Laterality Date  . HERNIA REPAIR       OB History    Gravida  0   Para  0   Term  0   Preterm  0   AB  0   Living  0     SAB  0   TAB  0   Ectopic  0   Multiple  0   Live Births  0           History reviewed. No pertinent family history.  Social History   Tobacco Use  . Smoking status: Current Every Day Smoker    Packs/day: 0.50    Types: Cigarettes  . Smokeless tobacco: Never Used  Substance Use Topics  . Alcohol use: Yes  . Drug use: Yes    Frequency: 14.0 times per week    Types: Marijuana    Home Medications Prior to Admission medications   Medication Sig Start Date End Date Taking? Authorizing Provider  albuterol (PROVENTIL HFA;VENTOLIN HFA) 108 (90 BASE) MCG/ACT inhaler Inhale 2 puffs into the lungs every 6 (six) hours as needed. For shortness of breath    [provider]  Ascorbic Acid (VITAMIN C PO) Take 2 each by mouth daily.    [provider]  erythromycin ophthalmic ointment Place a 1/2 inch ribbon of ointment into the lower eyelid. 05/17/19   Corena Herter, PA-C  ibuprofen (ADVIL,MOTRIN) 200 MG tablet Take 600 mg by mouth every 6 (six) hours as needed for moderate pain. For pain     [provider]  ketorolac (ACULAR) 0.5 % ophthalmic solution Place 1 drop into the left eye every 6 (six) hours. 05/17/19   Corena Herter, PA-C  megestrol (MEGACE) 40 MG tablet Take 1 tablet (40 mg total) by mouth 2 (two) times daily. 02/18/17   Julianne Handler, CNM    Allergies    Patient has no known allergies.  Review of Systems   Review of Systems  Constitutional: Negative for fever.  Eyes: Positive for discharge, redness and itching. Negative for visual disturbance.  Neurological: Negative for headaches.    Physical Exam Updated Vital Signs BP (!) 165/104 (BP Location: Left Arm)   Pulse 84   Temp 98 F (36.7 C) (Oral)   Resp 16   LMP 05/12/2019   SpO2 100%   Physical  Exam Vitals and nursing note reviewed. Exam conducted with a chaperone present.  Constitutional:      Appearance: Normal appearance.  HENT:     Head: Normocephalic and atraumatic.     Nose: Nose normal.  Eyes:     Comments: PERRL, EOM intact.  No pain with EOMs. Left eye: Injected conjunctiva.  No pupil deformity.  No significant discharge on exam.  No foreign bodies with eversion of eyelids.  Wood's lamp examination demonstrated no evidence of abrasion or ulceration.  No periorbital swelling or TTP.  No surrounding skin changes.  Cardiovascular:     Rate and Rhythm: Normal rate and regular rhythm.     Pulses: Normal pulses.     Heart sounds: Normal heart sounds.  Pulmonary:     Effort: Pulmonary effort is normal.  Musculoskeletal:     Cervical back: Normal range of motion and neck supple.  Skin:    General: Skin is dry.  Neurological:      Mental Status: She is alert and oriented to person, place, and time.     GCS: GCS eye subscore is 4. GCS verbal subscore is 5. GCS motor subscore is 6.  Psychiatric:        Mood and Affect: Mood normal.        Behavior: Behavior normal.        Thought Content: Thought content normal.     ED Results / Procedures / Treatments   Labs (all labs ordered are listed, but only abnormal results are displayed) Labs Reviewed - No data to display  EKG None  Radiology No results found.  Procedures Procedures (including critical care time)  Medications Ordered in ED Medications  fluorescein ophthalmic strip 1 strip (has no administration in time range)  tetracaine (PONTOCAINE) 0.5 % ophthalmic solution 2 drop (has no administration in time range)    ED Course  I have reviewed the triage vital signs and the nursing notes.  Pertinent labs & imaging results that were available during my care of the patient were reviewed by me and considered in my medical decision making (see chart for details).    MDM Rules/Calculators/A&P                      Patient presents to the ED with a 3-day history of unilateral presentation bacterial versus viral conjunctivitis.  Will treat with erythromycin ointment and ophthalmic ketorolac solution.  Encouraging warm compresses regularly.  Discussed precautions regarding transmission.  Encourage patient to wash pillow case and bed sheets.  With Woods lamp examination, no corneal abrasions or corneal ulcers observed.  Everted lids and saw no foreign bodies.  No surrounding cellulitis or concern for infection.  Patient has been afebrile and without systemic symptoms.  She denied any precipitating trauma or injury.  She also denies any new activities such as woodworking or other activities that may have introduced foreign body.    Put in a referral to Dr. Genia Del, ophthalmology, should she fail to improve despite prescribed medications.  Encourage patient to return to  the ED or seek medical attention immediately for any new or worsening symptoms.  Otherwise instructed patient to follow-up with her primary care provider for ongoing evaluation management of her conjunctivitis.  Patient voiced understanding and is agreeable to the plan.   Final Clinical Impression(s) / ED Diagnoses Final diagnoses:  Acute conjunctivitis of left eye, unspecified acute conjunctivitis type    Rx / DC Orders ED Discharge Orders  Ordered    erythromycin ophthalmic ointment     05/17/19 0952    ketorolac (ACULAR) 0.5 % ophthalmic solution  Every 6 hours     05/17/19 0045           Aleina, Burgio, PA-C 05/17/19 0957    Little, Ambrose Finland, MD 05/17/19 1149

## 2019-05-17 NOTE — ED Triage Notes (Signed)
Pt reports left eye drainage and reddness since Friday. Denies recent fevers. VSS.

## 2019-05-17 NOTE — ED Notes (Signed)
Woods Lamp at bedside.

## 2019-05-17 NOTE — Discharge Instructions (Signed)
Please read the attachment on conjunctivitis.   Please take your medications, as prescribed.  Continue warm compresses.  If your symptoms are not improved in 2 to 3 days, you will need to call ophthalmology to schedule an appointment.  Return to the ED or seek medical attention immediately for any new or worsening symptoms.

## 2019-08-12 ENCOUNTER — Inpatient Hospital Stay (HOSPITAL_COMMUNITY): Payer: Self-pay | Admitting: Certified Registered Nurse Anesthetist

## 2019-08-12 ENCOUNTER — Encounter (HOSPITAL_COMMUNITY): Admission: EM | Disposition: A | Discharge status: Home / Self Care | Payer: Self-pay | Source: Home / Self Care

## 2019-08-12 ENCOUNTER — Encounter (HOSPITAL_COMMUNITY): Payer: Self-pay

## 2019-08-12 ENCOUNTER — Emergency Department (HOSPITAL_COMMUNITY): Payer: Self-pay

## 2019-08-12 ENCOUNTER — Inpatient Hospital Stay (HOSPITAL_COMMUNITY)
Admission: EM | Admit: 2019-08-12 | Discharge: 2019-08-15 | DRG: 354 | Disposition: A | Discharge status: Home / Self Care | Payer: Self-pay | Attending: Surgery | Admitting: Surgery

## 2019-08-12 DIAGNOSIS — K421 Umbilical hernia with gangrene: Secondary | ICD-10-CM

## 2019-08-12 DIAGNOSIS — K420 Umbilical hernia with obstruction, without gangrene: Secondary | ICD-10-CM | POA: Diagnosis present

## 2019-08-12 DIAGNOSIS — Z6841 Body Mass Index (BMI) 40.0 and over, adult: Secondary | ICD-10-CM

## 2019-08-12 DIAGNOSIS — K431 Incisional hernia with gangrene: Principal | ICD-10-CM | POA: Diagnosis present

## 2019-08-12 DIAGNOSIS — R188 Other ascites: Secondary | ICD-10-CM | POA: Diagnosis present

## 2019-08-12 DIAGNOSIS — E119 Type 2 diabetes mellitus without complications: Secondary | ICD-10-CM | POA: Diagnosis present

## 2019-08-12 DIAGNOSIS — F17210 Nicotine dependence, cigarettes, uncomplicated: Secondary | ICD-10-CM | POA: Diagnosis present

## 2019-08-12 DIAGNOSIS — Z20822 Contact with and (suspected) exposure to covid-19: Secondary | ICD-10-CM | POA: Diagnosis present

## 2019-08-12 HISTORY — PX: INCISIONAL HERNIA REPAIR: SHX193

## 2019-08-12 HISTORY — PX: LAPAROTOMY: SHX154

## 2019-08-12 HISTORY — PX: OMENTECTOMY: SHX5985

## 2019-08-12 LAB — COMPREHENSIVE METABOLIC PANEL
ALT: 17 U/L (ref 0–44)
AST: 24 U/L (ref 15–41)
Albumin: 3 g/dL — ABNORMAL LOW (ref 3.5–5.0)
Alkaline Phosphatase: 82 U/L (ref 38–126)
Anion gap: 13 (ref 5–15)
BUN: 7 mg/dL (ref 6–20)
CO2: 22 mmol/L (ref 22–32)
Calcium: 8.6 mg/dL — ABNORMAL LOW (ref 8.9–10.3)
Chloride: 100 mmol/L (ref 98–111)
Creatinine, Ser: 1.1 mg/dL — ABNORMAL HIGH (ref 0.44–1.00)
GFR calc Af Amer: >60 mL/min (ref >60)
GFR calc non Af Amer: >60 mL/min (ref >60)
Glucose, Bld: 221 mg/dL — ABNORMAL HIGH (ref 70–99)
Potassium: 3.1 mmol/L — ABNORMAL LOW (ref 3.5–5.1)
Sodium: 135 mmol/L (ref 135–145)
Total Bilirubin: 1.5 mg/dL — ABNORMAL HIGH (ref 0.3–1.2)
Total Protein: 7.2 g/dL (ref 6.5–8.1)

## 2019-08-12 LAB — CBC WITH DIFFERENTIAL/PLATELET
Abs Immature Granulocytes: 0 10*3/uL (ref 0.00–0.07)
Basophils Absolute: 0 10*3/uL (ref 0.0–0.1)
Basophils Relative: 0 %
Eosinophils Absolute: 0 10*3/uL (ref 0.0–0.5)
Eosinophils Relative: 0 %
HCT: 32.4 % — ABNORMAL LOW (ref 36.0–46.0)
Hemoglobin: 9.5 g/dL — ABNORMAL LOW (ref 12.0–15.0)
Lymphocytes Relative: 8 %
Lymphs Abs: 2.8 10*3/uL (ref 0.7–4.0)
MCH: 19.5 pg — ABNORMAL LOW (ref 26.0–34.0)
MCHC: 29.3 g/dL — ABNORMAL LOW (ref 30.0–36.0)
MCV: 66.7 fL — ABNORMAL LOW (ref 80.0–100.0)
Monocytes Absolute: 3.9 10*3/uL — ABNORMAL HIGH (ref 0.1–1.0)
Monocytes Relative: 11 %
Neutro Abs: 28.6 10*3/uL — ABNORMAL HIGH (ref 1.7–7.7)
Neutrophils Relative %: 81 %
Platelets: 393 10*3/uL (ref 150–400)
RBC: 4.86 MIL/uL (ref 3.87–5.11)
RDW: 18.6 % — ABNORMAL HIGH (ref 11.5–15.5)
WBC: 35.3 10*3/uL — ABNORMAL HIGH (ref 4.0–10.5)
nRBC: 0 % (ref 0.0–0.2)
nRBC: 0 /100 WBC

## 2019-08-12 LAB — CBC
HCT: 32.1 % — ABNORMAL LOW (ref 36.0–46.0)
Hemoglobin: 9.5 g/dL — ABNORMAL LOW (ref 12.0–15.0)
MCH: 19.3 pg — ABNORMAL LOW (ref 26.0–34.0)
MCHC: 29.6 g/dL — ABNORMAL LOW (ref 30.0–36.0)
MCV: 65.2 fL — ABNORMAL LOW (ref 80.0–100.0)
Platelets: 390 10*3/uL (ref 150–400)
RBC: 4.92 MIL/uL (ref 3.87–5.11)
RDW: 18.6 % — ABNORMAL HIGH (ref 11.5–15.5)
WBC: 35.5 10*3/uL — ABNORMAL HIGH (ref 4.0–10.5)
nRBC: 0 % (ref 0.0–0.2)

## 2019-08-12 LAB — URINALYSIS, ROUTINE W REFLEX MICROSCOPIC
Bilirubin Urine: NEGATIVE
Glucose, UA: NEGATIVE mg/dL
Ketones, ur: NEGATIVE mg/dL
Nitrite: NEGATIVE
Protein, ur: 100 mg/dL — AB
Specific Gravity, Urine: 1.014 (ref 1.005–1.030)
pH: 6 (ref 5.0–8.0)

## 2019-08-12 LAB — TYPE AND SCREEN
ABO/RH(D): O POS
Antibody Screen: NEGATIVE

## 2019-08-12 LAB — PROTIME-INR
INR: 1.5 — ABNORMAL HIGH (ref 0.8–1.2)
Prothrombin Time: 17.5 seconds — ABNORMAL HIGH (ref 11.4–15.2)

## 2019-08-12 LAB — LIPASE, BLOOD: Lipase: 15 U/L (ref 11–51)

## 2019-08-12 LAB — HEMOGLOBIN A1C
Hgb A1c MFr Bld: 6.9 % — ABNORMAL HIGH (ref 4.8–5.6)
Mean Plasma Glucose: 151.33 mg/dL

## 2019-08-12 LAB — LACTIC ACID, PLASMA: Lactic Acid, Venous: 1.5 mmol/L (ref 0.5–1.9)

## 2019-08-12 LAB — I-STAT BETA HCG BLOOD, ED (MC, WL, AP ONLY): I-stat hCG, quantitative: <5 m[IU]/mL (ref <5)

## 2019-08-12 LAB — RESPIRATORY PANEL BY RT PCR (FLU A&B, COVID)
Influenza A by PCR: NEGATIVE
Influenza B by PCR: NEGATIVE
SARS Coronavirus 2 by RT PCR: NEGATIVE

## 2019-08-12 LAB — APTT: aPTT: 36 seconds (ref 24–36)

## 2019-08-12 LAB — ABO/RH: ABO/RH(D): O POS

## 2019-08-12 SURGERY — LAPAROTOMY, EXPLORATORY
Anesthesia: General | Site: Abdomen

## 2019-08-12 MED ORDER — SODIUM CHLORIDE 0.9% FLUSH
3.0000 mL | Freq: Once | INTRAVENOUS | Status: AC
  Administered 2019-08-12: 3 mL via INTRAVENOUS

## 2019-08-12 MED ORDER — MORPHINE SULFATE (PF) 2 MG/ML IV SOLN
1.0000 mg | INTRAVENOUS | Status: DC | PRN
  Administered 2019-08-12: 4 mg via INTRAVENOUS
  Administered 2019-08-13: 2 mg via INTRAVENOUS
  Filled 2019-08-12: qty 2
  Filled 2019-08-12: qty 1

## 2019-08-12 MED ORDER — LACTATED RINGERS IV SOLN: INTRAVENOUS | Status: DC | PRN

## 2019-08-12 MED ORDER — PIPERACILLIN-TAZOBACTAM 3.375 G IVPB
3.3750 g | Freq: Once | INTRAVENOUS | Status: AC
  Administered 2019-08-12: 3.375 g via INTRAVENOUS
  Filled 2019-08-12: qty 50

## 2019-08-12 MED ORDER — LACTATED RINGERS IV BOLUS
1000.0000 mL | Freq: Once | INTRAVENOUS | Status: AC
  Administered 2019-08-12: 1000 mL via INTRAVENOUS

## 2019-08-12 MED ORDER — ROCURONIUM BROMIDE 10 MG/ML (PF) SYRINGE
PREFILLED_SYRINGE | INTRAVENOUS | Status: AC
  Filled 2019-08-12: qty 10

## 2019-08-12 MED ORDER — SODIUM CHLORIDE 0.9 % IV SOLN: INTRAVENOUS | Status: DC

## 2019-08-12 MED ORDER — DEXAMETHASONE SODIUM PHOSPHATE 10 MG/ML IJ SOLN
INTRAMUSCULAR | Status: DC | PRN
  Administered 2019-08-12: 10 mg via INTRAVENOUS

## 2019-08-12 MED ORDER — ONDANSETRON HCL 4 MG/2ML IJ SOLN
INTRAMUSCULAR | Status: AC
  Filled 2019-08-12: qty 2

## 2019-08-12 MED ORDER — IOHEXOL 300 MG/ML  SOLN
100.0000 mL | Freq: Once | INTRAMUSCULAR | Status: AC | PRN
  Administered 2019-08-12: 100 mL via INTRAVENOUS

## 2019-08-12 MED ORDER — FENTANYL CITRATE (PF) 250 MCG/5ML IJ SOLN
INTRAMUSCULAR | Status: DC | PRN
  Administered 2019-08-12: 100 ug via INTRAVENOUS
  Administered 2019-08-12: 50 ug via INTRAVENOUS

## 2019-08-12 MED ORDER — FENTANYL CITRATE (PF) 250 MCG/5ML IJ SOLN
INTRAMUSCULAR | Status: AC
  Filled 2019-08-12: qty 5

## 2019-08-12 MED ORDER — IBUPROFEN 600 MG PO TABS: 600.0000 mg | ORAL_TABLET | Freq: Four times a day (QID) | ORAL | Status: DC | PRN

## 2019-08-12 MED ORDER — PHENYLEPHRINE HCL-NACL 10-0.9 MG/250ML-% IV SOLN
INTRAVENOUS | Status: DC | PRN
  Administered 2019-08-12: 25 ug/min via INTRAVENOUS

## 2019-08-12 MED ORDER — ONDANSETRON HCL 4 MG/2ML IJ SOLN: 4.0000 mg | INTRAMUSCULAR | Status: DC | PRN

## 2019-08-12 MED ORDER — LIDOCAINE 2% (20 MG/ML) 5 ML SYRINGE
INTRAMUSCULAR | Status: DC | PRN
  Administered 2019-08-12: 80 mg via INTRAVENOUS

## 2019-08-12 MED ORDER — HYDROCODONE-ACETAMINOPHEN 7.5-325 MG PO TABS: 1.0000 | ORAL_TABLET | Freq: Once | ORAL | Status: DC | PRN

## 2019-08-12 MED ORDER — ACETAMINOPHEN 325 MG PO TABS
650.0000 mg | ORAL_TABLET | Freq: Once | ORAL | Status: AC
  Administered 2019-08-12: 650 mg via ORAL
  Filled 2019-08-12: qty 2

## 2019-08-12 MED ORDER — ONDANSETRON HCL 4 MG/2ML IJ SOLN
INTRAMUSCULAR | Status: DC | PRN
  Administered 2019-08-12: 4 mg via INTRAVENOUS

## 2019-08-12 MED ORDER — HYDROMORPHONE HCL 1 MG/ML IJ SOLN
0.5000 mg | Freq: Once | INTRAMUSCULAR | Status: AC
  Administered 2019-08-12: 0.5 mg via INTRAVENOUS
  Filled 2019-08-12: qty 1

## 2019-08-12 MED ORDER — SODIUM CHLORIDE 0.9 % IV SOLN
2.0000 g | INTRAVENOUS | Status: AC
  Administered 2019-08-12: 13:00:00 2 g via INTRAVENOUS
  Filled 2019-08-12 (×2): qty 2

## 2019-08-12 MED ORDER — ACETAMINOPHEN 650 MG RE SUPP: 650.0000 mg | Freq: Once | RECTAL | Status: DC

## 2019-08-12 MED ORDER — PROPOFOL 10 MG/ML IV BOLUS
INTRAVENOUS | Status: DC | PRN
  Administered 2019-08-12: 200 mg via INTRAVENOUS

## 2019-08-12 MED ORDER — PIPERACILLIN-TAZOBACTAM 3.375 G IVPB
3.3750 g | Freq: Three times a day (TID) | INTRAVENOUS | Status: DC
  Administered 2019-08-12 – 2019-08-15 (×8): 3.375 g via INTRAVENOUS
  Filled 2019-08-12 (×8): qty 50

## 2019-08-12 MED ORDER — 0.9 % SODIUM CHLORIDE (POUR BTL) OPTIME
TOPICAL | Status: DC | PRN
  Administered 2019-08-12 (×2): 1000 mL

## 2019-08-12 MED ORDER — PROPOFOL 10 MG/ML IV BOLUS
INTRAVENOUS | Status: AC
  Filled 2019-08-12: qty 40

## 2019-08-12 MED ORDER — SODIUM CHLORIDE 0.9 % IV BOLUS
1000.0000 mL | Freq: Once | INTRAVENOUS | Status: AC
  Administered 2019-08-12: 1000 mL via INTRAVENOUS

## 2019-08-12 MED ORDER — PHENYLEPHRINE 40 MCG/ML (10ML) SYRINGE FOR IV PUSH (FOR BLOOD PRESSURE SUPPORT)
PREFILLED_SYRINGE | INTRAVENOUS | Status: AC
  Filled 2019-08-12: qty 10

## 2019-08-12 MED ORDER — ALBUTEROL SULFATE HFA 108 (90 BASE) MCG/ACT IN AERS
INHALATION_SPRAY | RESPIRATORY_TRACT | Status: AC
  Filled 2019-08-12: qty 6.7

## 2019-08-12 MED ORDER — ONDANSETRON HCL 4 MG/2ML IJ SOLN: 4.0000 mg | Freq: Once | INTRAMUSCULAR | Status: DC | PRN

## 2019-08-12 MED ORDER — HYDROMORPHONE HCL 1 MG/ML IJ SOLN
0.5000 mg | Freq: Once | INTRAMUSCULAR | Status: AC
Start: 2019-08-12 — End: 2019-08-12
  Administered 2019-08-12: 0.5 mg via INTRAVENOUS
  Filled 2019-08-12: qty 1

## 2019-08-12 MED ORDER — POLYETHYLENE GLYCOL 3350 17 G PO PACK
17.0000 g | PACK | Freq: Every day | ORAL | Status: DC
  Administered 2019-08-13: 17 g via ORAL
  Filled 2019-08-12 (×2): qty 1

## 2019-08-12 MED ORDER — POTASSIUM CHLORIDE IN NACL 40-0.9 MEQ/L-% IV SOLN
INTRAVENOUS | Status: DC
  Administered 2019-08-12 – 2019-08-13 (×3): 125 mL/h via INTRAVENOUS
  Filled 2019-08-12 (×3): qty 1000

## 2019-08-12 MED ORDER — SUCCINYLCHOLINE CHLORIDE 200 MG/10ML IV SOSY
PREFILLED_SYRINGE | INTRAVENOUS | Status: AC
  Filled 2019-08-12: qty 10

## 2019-08-12 MED ORDER — ROCURONIUM BROMIDE 10 MG/ML (PF) SYRINGE
PREFILLED_SYRINGE | INTRAVENOUS | Status: DC | PRN
  Administered 2019-08-12: 50 mg via INTRAVENOUS

## 2019-08-12 MED ORDER — SIMETHICONE 80 MG PO CHEW: 40.0000 mg | CHEWABLE_TABLET | Freq: Four times a day (QID) | ORAL | Status: DC | PRN

## 2019-08-12 MED ORDER — FENTANYL CITRATE (PF) 100 MCG/2ML IJ SOLN
INTRAMUSCULAR | Status: AC
  Filled 2019-08-12: qty 2

## 2019-08-12 MED ORDER — SUCCINYLCHOLINE CHLORIDE 200 MG/10ML IV SOSY
PREFILLED_SYRINGE | INTRAVENOUS | Status: DC | PRN
  Administered 2019-08-12: 160 mg via INTRAVENOUS

## 2019-08-12 MED ORDER — MIDAZOLAM HCL 5 MG/5ML IJ SOLN
INTRAMUSCULAR | Status: DC | PRN
  Administered 2019-08-12: 2 mg via INTRAVENOUS

## 2019-08-12 MED ORDER — METOPROLOL TARTRATE 5 MG/5ML IV SOLN: 5.0000 mg | Freq: Four times a day (QID) | INTRAVENOUS | Status: DC | PRN

## 2019-08-12 MED ORDER — ONDANSETRON HCL 4 MG/2ML IJ SOLN
4.0000 mg | Freq: Once | INTRAMUSCULAR | Status: AC
  Administered 2019-08-12: 4 mg via INTRAVENOUS
  Filled 2019-08-12: qty 2

## 2019-08-12 MED ORDER — LACTATED RINGERS IV SOLN: INTRAVENOUS | Status: DC

## 2019-08-12 MED ORDER — METHOCARBAMOL 500 MG PO TABS
1000.0000 mg | ORAL_TABLET | Freq: Three times a day (TID) | ORAL | Status: DC
  Administered 2019-08-12 – 2019-08-14 (×8): 1000 mg via ORAL
  Filled 2019-08-12 (×8): qty 2

## 2019-08-12 MED ORDER — OXYCODONE HCL 5 MG PO TABS
5.0000 mg | ORAL_TABLET | ORAL | Status: DC | PRN
  Administered 2019-08-12 – 2019-08-15 (×10): 10 mg via ORAL
  Filled 2019-08-12 (×10): qty 2

## 2019-08-12 MED ORDER — DIPHENHYDRAMINE HCL 25 MG PO CAPS: 25.0000 mg | ORAL_CAPSULE | Freq: Four times a day (QID) | ORAL | Status: DC | PRN

## 2019-08-12 MED ORDER — DIPHENHYDRAMINE HCL 50 MG/ML IJ SOLN: 25.0000 mg | Freq: Four times a day (QID) | INTRAMUSCULAR | Status: DC | PRN

## 2019-08-12 MED ORDER — FENTANYL CITRATE (PF) 100 MCG/2ML IJ SOLN
25.0000 ug | INTRAMUSCULAR | Status: DC | PRN
  Administered 2019-08-12 (×2): 50 ug via INTRAVENOUS

## 2019-08-12 MED ORDER — LIDOCAINE 2% (20 MG/ML) 5 ML SYRINGE
INTRAMUSCULAR | Status: AC
  Filled 2019-08-12: qty 5

## 2019-08-12 MED ORDER — PHENYLEPHRINE 40 MCG/ML (10ML) SYRINGE FOR IV PUSH (FOR BLOOD PRESSURE SUPPORT)
PREFILLED_SYRINGE | INTRAVENOUS | Status: DC | PRN
  Administered 2019-08-12: 80 ug via INTRAVENOUS
  Administered 2019-08-12: 120 ug via INTRAVENOUS
  Administered 2019-08-12: 80 ug via INTRAVENOUS

## 2019-08-12 MED ORDER — MIDAZOLAM HCL 2 MG/2ML IJ SOLN
INTRAMUSCULAR | Status: AC
  Filled 2019-08-12: qty 2

## 2019-08-12 MED ORDER — SUGAMMADEX SODIUM 500 MG/5ML IV SOLN
INTRAVENOUS | Status: DC | PRN
  Administered 2019-08-12: 400 mg via INTRAVENOUS

## 2019-08-12 MED ORDER — SUGAMMADEX SODIUM 500 MG/5ML IV SOLN
INTRAVENOUS | Status: AC
  Filled 2019-08-12: qty 5

## 2019-08-12 MED ORDER — ACETAMINOPHEN 500 MG PO TABS
1000.0000 mg | ORAL_TABLET | Freq: Four times a day (QID) | ORAL | Status: DC
  Administered 2019-08-12 – 2019-08-15 (×9): 1000 mg via ORAL
  Filled 2019-08-12 (×10): qty 2

## 2019-08-12 MED ORDER — ENOXAPARIN SODIUM 40 MG/0.4ML ~~LOC~~ SOLN
40.0000 mg | SUBCUTANEOUS | Status: DC
  Administered 2019-08-13 – 2019-08-14 (×2): 40 mg via SUBCUTANEOUS
  Filled 2019-08-12 (×2): qty 0.4

## 2019-08-12 SURGICAL SUPPLY — 43 items
BLADE CLIPPER SURG (BLADE) IMPLANT
CANISTER SUCT 3000ML PPV (MISCELLANEOUS) ×3 IMPLANT
CHLORAPREP W/TINT 26 (MISCELLANEOUS) ×3 IMPLANT
COVER SURGICAL LIGHT HANDLE (MISCELLANEOUS) ×3 IMPLANT
DRAIN CHANNEL 19F RND (DRAIN) ×2 IMPLANT
DRAPE LAPAROSCOPIC ABDOMINAL (DRAPES) ×3 IMPLANT
DRAPE WARM FLUID 44X44 (DRAPES) ×3 IMPLANT
DRSG OPSITE POSTOP 4X10 (GAUZE/BANDAGES/DRESSINGS) IMPLANT
DRSG OPSITE POSTOP 4X8 (GAUZE/BANDAGES/DRESSINGS) ×2 IMPLANT
ELECT BLADE 6.5 EXT (BLADE) IMPLANT
ELECT CAUTERY BLADE 6.4 (BLADE) ×3 IMPLANT
ELECT REM PT RETURN 9FT ADLT (ELECTROSURGICAL) ×3
ELECTRODE REM PT RTRN 9FT ADLT (ELECTROSURGICAL) ×1 IMPLANT
EVACUATOR SILICONE 100CC (DRAIN) ×2 IMPLANT
GLOVE BIO SURGEON STRL SZ7.5 (GLOVE) ×3 IMPLANT
GLOVE BIOGEL PI IND STRL 8 (GLOVE) ×1 IMPLANT
GLOVE BIOGEL PI INDICATOR 8 (GLOVE) ×2
GOWN STRL REUS W/ TWL LRG LVL3 (GOWN DISPOSABLE) ×1 IMPLANT
GOWN STRL REUS W/ TWL XL LVL3 (GOWN DISPOSABLE) ×1 IMPLANT
GOWN STRL REUS W/TWL LRG LVL3 (GOWN DISPOSABLE) ×2
GOWN STRL REUS W/TWL XL LVL3 (GOWN DISPOSABLE) ×2
HANDLE SUCTION POOLE (INSTRUMENTS) ×1 IMPLANT
KIT BASIN OR (CUSTOM PROCEDURE TRAY) ×3 IMPLANT
KIT TURNOVER KIT B (KITS) ×3 IMPLANT
LIGASURE IMPACT 36 18CM CVD LR (INSTRUMENTS) ×2 IMPLANT
NS IRRIG 1000ML POUR BTL (IV SOLUTION) ×6 IMPLANT
PACK GENERAL/GYN (CUSTOM PROCEDURE TRAY) ×3 IMPLANT
PAD ARMBOARD 7.5X6 YLW CONV (MISCELLANEOUS) ×3 IMPLANT
PENCIL SMOKE EVACUATOR (MISCELLANEOUS) ×3 IMPLANT
SPECIMEN JAR LARGE (MISCELLANEOUS) IMPLANT
SPONGE LAP 18X18 RF (DISPOSABLE) IMPLANT
STAPLER VISISTAT 35W (STAPLE) ×3 IMPLANT
SUCTION POOLE HANDLE (INSTRUMENTS) ×3
SUT ETHILON 2 0 FS 18 (SUTURE) ×2 IMPLANT
SUT NOVA NAB GS-21 0 18 T12 DT (SUTURE) ×2 IMPLANT
SUT PDS AB 1 TP1 54 (SUTURE) ×6 IMPLANT
SUT SILK 2 0 SH CR/8 (SUTURE) ×3 IMPLANT
SUT SILK 2 0 TIES 10X30 (SUTURE) ×3 IMPLANT
SUT SILK 3 0 SH CR/8 (SUTURE) ×3 IMPLANT
SUT SILK 3 0 TIES 10X30 (SUTURE) ×3 IMPLANT
TOWEL GREEN STERILE (TOWEL DISPOSABLE) ×3 IMPLANT
TRAY FOLEY MTR SLVR 16FR STAT (SET/KITS/TRAYS/PACK) ×3 IMPLANT
YANKAUER SUCT BULB TIP NO VENT (SUCTIONS) IMPLANT

## 2019-08-12 NOTE — Anesthesia Procedure Notes (Signed)
Procedure Name: Intubation Date/Time: 08/12/2019 1:25 PM Performed by: Nils Pyle, CRNA Pre-anesthesia Checklist: Patient identified, Emergency Drugs available, Suction available and Patient being monitored Patient Re-evaluated:Patient Re-evaluated prior to induction Oxygen Delivery Method: Circle System Utilized Preoxygenation: Pre-oxygenation with 100% oxygen Induction Type: IV induction, Rapid sequence and Cricoid Pressure applied Laryngoscope Size: Miller and 2 Grade View: Grade I Tube type: Oral Tube size: 7.5 mm Number of attempts: 1 Airway Equipment and Method: Stylet and Oral airway Placement Confirmation: ETT inserted through vocal cords under direct vision,  positive ETCO2 and breath sounds checked- equal and bilateral Secured at: 23 cm Tube secured with: Tape Dental Injury: Teeth and Oropharynx as per pre-operative assessment

## 2019-08-12 NOTE — Op Note (Signed)
08/12/2019  2:25 PM  PATIENT:  Victoria Chavez  39 y.o. female  PRE-OPERATIVE DIAGNOSIS: Recurrent incarcerated incisional hernia  POST-OPERATIVE DIAGNOSIS:  incarcerated, recurrent incisional hernia, purulent ascites  PROCEDURE:  Procedure(s): EXPLORATORY LAPAROTOMY (N/A) Recurrent incisional hernia repair (N/A) Partial Omentectomy (N/A)  SURGEON:  Surgeon(s) and Role:    Axel Filler, MD - Primary  ANESTHESIA:   local and general  EBL:  30 mL   BLOOD ADMINISTERED:none  DRAINS: (19 [place]) Jackson-Pratt drain(s) with closed bulb suction in the subcutaneous space/hernia sac  LOCAL MEDICATIONS USED:  NONE  SPECIMEN:  Source of Specimen: Partial omentectomy  DISPOSITION OF SPECIMEN:  PATHOLOGY  COUNTS:  YES  TOURNIQUET:  * No tourniquets in log *  DICTATION: .Dragon Dictation Indication procedure: Patient is a 39 year old female who came in with a 3-day history of an incarcerated recurrent incisional hernia.  Patient had this since approximate 2006.  Patient did have pain, elevated white count.  Patient was taken back emergently for ex lap.  Details procedure: After the patient was consented she was taken back to the OR placed in supine position with bilateral SCDs in place.  She underwent general trach intubation.  Patient was prepped and draped in standard fashion.  A timeout was called and all facts verified.  10.  Blade was used to make an upper midline incision.  Cautery was used to maintain hemostasis and dissection was taken down to the midline fascia.  This was incised.  The fascia was elevated between 2 Kocher's.  The peritoneum was entered bluntly.  At this time was able to feel the hernia sac which was just inferior to the incision.  The fascial incision was then extended inferiorly to I was able to continue to skin incision over the midline of the hernia.  There appeared to be incarcerated transverse colon as well as omentum.  This was carried down just below  the area of the hernia.  At this time I was able to visualize the hernia sac.  There was some purulent ascites within the hernia sac.  This was suctioned out.  The hernia itself was approximately 4 cm.  The omentum appeared to have some fibrinous exudate.  This was transected with a LigaSure device.  At this time I was able to replace the omentum and the transverse colon within the abdominal cavity.  Secondary to the purulent ascites I decided to close this primarily.  At this time there are 1 PDS single-stranded was used to reapproximate the fascia in a running fashion x2.  Interrupted 0 Novafil's were used to help reapproximate the fascia.  There is large amount of redundant hernia skin.  This was somewhat debrided.  A 19 [place] Blake drain was then placed in the hernia sac.  This was brought up via a stab incision in the right lower quadrant.  The skin was stapled.  A honeycomb dressing placed over the incision site.  Patient taught the procedure well was taken to the recovery in stable condition.    PLAN OF CARE: Admit to inpatient   PATIENT DISPOSITION:  PACU - hemodynamically stable.   Delay start of Pharmacological VTE agent (>24hrs) due to surgical blood loss or risk of bleeding: yes

## 2019-08-12 NOTE — Transfer of Care (Signed)
Immediate Anesthesia Transfer of Care Note  Patient: Carlissa Ducharme  Procedure(s) Performed: EXPLORATORY LAPAROTOMY (N/A Abdomen) Recurrent incisional hernia repair (N/A Abdomen) Partial Omentectomy (N/A Abdomen)  Patient Location: PACU  Anesthesia Type:General  Level of Consciousness: awake, alert  and oriented  Airway & Oxygen Therapy: Patient Spontanous Breathing and Patient connected to face mask oxygen  Post-op Assessment: Report given to RN, Post -op Vital signs reviewed and stable and Patient moving all extremities X 4  Post vital signs: Reviewed and stable  Last Vitals:  Vitals Value Taken Time  BP    Temp    Pulse 113 08/12/19 1440  Resp 19 08/12/19 1441  SpO2 99 % 08/12/19 1440  Vitals shown include unvalidated device data.  Last Pain:  Vitals:   08/12/19 1057  TempSrc: Oral  PainSc:          Complications: No apparent anesthesia complications

## 2019-08-12 NOTE — ED Provider Notes (Signed)
[facility] EMERGENCY DEPARTMENT Provider Note   CSN: [id number] Arrival date & time: 08/12/19  2694     History Chief Complaint  Patient presents with  . Shortness of Breath  . Abdominal Pain    Victoria Chavez is a 39 y.o. female.  Patient with history of obesity, previous umbilical hernia repair x2 with recurrence of hernia --presents to the emergency department today with complaint of fever, abdominal pain.  States that for the past 3 days the area of her hernia has been very tender and swollen.  Typically it goes in and out easily but has been stuck out since that time.  She denies any vomiting.  She has had some nonbloody diarrhea.  She reports generalized body aches.  No recent Covid contacts or cough.  She states that she has been taking ibuprofen at home.  The onset of this condition was acute. The course is worsening. Aggravating factors: palpation. Alleviating factors: none.          Past Medical History:  Diagnosis Date  . Abdominal hernia   . Morbid obesity [facility])     Patient Active Problem List   Diagnosis Date Noted  . Morbid obesity (HCC) 02/18/2017  . Umbilical hernia 02/18/2017  . Tobacco user 02/18/2017    Past Surgical History:  Procedure Laterality Date  . HERNIA REPAIR       OB History    Gravida  0   Para  0   Term  0   Preterm  0   AB  0   Living  0     SAB  0   TAB  0   Ectopic  0   Multiple  0   Live Births  0           No family history on file.  Social History   Tobacco Use  . Smoking status: Current Every Day Smoker    Packs/day: 0.50    Types: Cigarettes  . Smokeless tobacco: Never Used  Substance Use Topics  . Alcohol use: Yes  . Drug use: Yes    Frequency: 14.0 times per week    Types: Marijuana    Home Medications Prior to Admission medications   Medication Sig Start Date End Date Taking? Authorizing Provider  albuterol (PROVENTIL HFA;VENTOLIN HFA) 108 (90 BASE) MCG/ACT inhaler  Inhale 2 puffs into the lungs every 6 (six) hours as needed. For shortness of breath    [provider]  Ascorbic Acid (VITAMIN C PO) Take 2 each by mouth daily.    [provider]  erythromycin ophthalmic ointment Place a 1/2 inch ribbon of ointment into the lower eyelid. 05/17/19   Lorelee New, PA-C  ibuprofen (ADVIL,MOTRIN) 200 MG tablet Take 600 mg by mouth every 6 (six) hours as needed for moderate pain. For pain     [provider]  ketorolac (ACULAR) 0.5 % ophthalmic solution Place 1 drop into the left eye every 6 (six) hours. 05/17/19   Lorelee New, PA-C  megestrol (MEGACE) 40 MG tablet Take 1 tablet (40 mg total) by mouth 2 (two) times daily. 02/18/17   Donette Larry, CNM    Allergies    Patient has no known allergies.  Review of Systems   Review of Systems  Constitutional: Positive for chills and fever.  HENT: Negative for rhinorrhea and sore throat.   Eyes: Negative for redness.  Respiratory: Negative for cough.   Cardiovascular: Negative for chest pain.  Gastrointestinal:  Positive for abdominal distention, abdominal pain and diarrhea. Negative for nausea and vomiting.  Genitourinary: Negative for dysuria.  Musculoskeletal: Positive for myalgias.  Skin: Positive for color change. Negative for rash.  Neurological: Negative for headaches.    Physical Exam Updated Vital Signs BP 109/67   Pulse (!) 114   Temp (!) 100.9 F (38.3 C) (Oral)   Resp (!) 29   SpO2 97%   Physical Exam Vitals and nursing note reviewed.  Constitutional:      Appearance: She is well-developed.  HENT:     Head: Normocephalic and atraumatic.  Eyes:     General:        Right eye: No discharge.        Left eye: No discharge.     Conjunctiva/sclera: Conjunctivae normal.  Cardiovascular:     Rate and Rhythm: Regular rhythm. Tachycardia present.     Heart sounds: Normal heart sounds.  Pulmonary:     Effort: Pulmonary effort is normal.     Breath sounds:  Normal breath sounds.  Abdominal:     General: There is distension.     Palpations: Abdomen is soft.     Tenderness: There is abdominal tenderness in the periumbilical area.     Hernia: A hernia is present. Hernia is present in the umbilical area.  Musculoskeletal:     Cervical back: Normal range of motion and neck supple.  Skin:    General: Skin is warm and dry.  Neurological:     Mental Status: She is alert.            ED Results / Procedures / Treatments   Labs (all labs ordered are listed, but only abnormal results are displayed) Labs Reviewed  COMPREHENSIVE METABOLIC PANEL - Abnormal; Notable for the following components:      Result Value   Potassium 3.1 (*)    Glucose, Bld 221 (*)    Creatinine, Ser 1.10 (*)    Calcium 8.6 (*)    Albumin 3.0 (*)    Total Bilirubin 1.5 (*)    All other components within normal limits  CBC - Abnormal; Notable for the following components:   WBC 35.5 (*)    Hemoglobin 9.5 (*)    HCT 32.1 (*)    MCV 65.2 (*)    MCH 19.3 (*)    MCHC 29.6 (*)    RDW 18.6 (*)    All other components within normal limits  URINALYSIS, ROUTINE W REFLEX MICROSCOPIC - Abnormal; Notable for the following components:   APPearance CLOUDY (*)    Hgb urine dipstick SMALL (*)    Protein, ur 100 (*)    Leukocytes,Ua TRACE (*)    Bacteria, UA RARE (*)    All other components within normal limits  PROTIME-INR - Abnormal; Notable for the following components:   Prothrombin Time 17.5 (*)    INR 1.5 (*)    All other components within normal limits  CULTURE, BLOOD (ROUTINE X 2)  CULTURE, BLOOD (ROUTINE X 2)  URINE CULTURE  RESPIRATORY PANEL BY RT PCR (FLU A&B, COVID)  LIPASE, BLOOD  LACTIC ACID, PLASMA  APTT  LACTIC ACID, PLASMA  HEMOGLOBIN A1C  CBC WITH DIFFERENTIAL/PLATELET  I-STAT BETA HCG BLOOD, ED ([facility], [facility], [facility] ONLY)    EKG EKG Interpretation  Date/Time:  Thursday August 12 2019 05:46:04 EDT Ventricular Rate:  118 PR Interval:  142 QRS  Duration: 82 QT Interval:  348 QTC Calculation: 487 R Axis:   1 Text Interpretation:  Sinus tachycardia with occasional Premature ventricular complexes Nonspecific ST abnormality Abnormal ECG When compared with ECG of EARLIER SAME DATE No significant change was found Confirmed by Dione Booze ([id number]) on 08/12/2019 6:29:12 AM   Radiology DG Chest Portable 1 View  Result Date: 08/12/2019 CLINICAL DATA:  Shortness of breath. Abdominal pain. Body aches and fevers. EXAM: PORTABLE CHEST 1 VIEW COMPARISON:  No prior. FINDINGS: Mediastinum and hilar structures normal. Cardiomegaly. Normal pulmonary vascularity. Low lung volumes. Mild bibasilar interstitial prominence cannot be excluded. Mild pneumonitis/interstitial edema cannot be excluded. No pleural effusion or pneumothorax. No acute bony abnormality. Degenerative change thoracic spine. IMPRESSION: 1.  Cardiomegaly.  No pulmonary venous congestion. 2. Low lung volumes. Mild bibasilar interstitial prominence cannot be excluded. Mild pneumonitis/interstitial edema cannot be excluded. Electronically Signed   By: Maisie Fus  Register   On: 08/12/2019 06:15    Procedures Procedures (including critical care time)  Medications Ordered in ED Medications  piperacillin-tazobactam (ZOSYN) IVPB 3.375 g (3.375 g Intravenous New Bag/Given 08/12/19 0903)  morphine 2 MG/ML injection 1-4 mg (has no administration in time range)  ondansetron (ZOFRAN) injection 4 mg (has no administration in time range)  0.9 % NaCl with KCl 40 mEq / L  infusion (has no administration in time range)  HYDROmorphone (DILAUDID) injection 0.5 mg (has no administration in time range)  sodium chloride flush (NS) 0.9 % injection 3 mL (3 mLs Intravenous Given 08/12/19 0906)  HYDROmorphone (DILAUDID) injection 0.5 mg (0.5 mg Intravenous Given 08/12/19 0905)  ondansetron (ZOFRAN) injection 4 mg (4 mg Intravenous Given 08/12/19 0904)  lactated ringers bolus 1,000 mL (1,000 mLs Intravenous New Bag/Given  08/12/19 9030)    ED Course  I have reviewed the triage vital signs and the nursing notes.  Pertinent labs & imaging results that were available during my care of the patient were reviewed by me and considered in my medical decision making (see chart for details).  Patient seen and examined.  Initial labs reviewed.  Clinical concern for sepsis.  Patient with incarcerated umbilical hernia, unable to be reduced at bedside due to amount of swelling, firmness, and patient discomfort.  Concern for strangulated hernia.  Code sepsis ordered.  Antibiotics ordered.  Pending lactate to guide fluid resuscitation. D/w Dr. Rosalia Hammers. Rapid COVID ordered.   Vital signs reviewed and are as follows: BP 109/67   Pulse (!) 114   Temp (!) 100.9 F (38.3 C) (Oral)   Resp (!) 29   SpO2 97%   Discussed patient with Remi Haggard of general surgery.  They will consult.  Aware CT pending.  10:00 AM Pt rechecked, stable. Additional pain medication ordered. Awaiting CT. Lactate OK.   10:52 AM Pt in CT, reviewed imaging.   Results reviewed. Consistent with incarcerated hernia. To OR shortly.    CRITICAL CARE Performed by: Renne Crigler PA-C Total critical care time: 45 minutes Critical care time was exclusive of separately billable procedures and treating other patients. Critical care was necessary to treat or prevent imminent or life-threatening deterioration. Critical care was time spent personally by me on the following activities: development of treatment plan with patient and/or surrogate as well as nursing, discussions with consultants, evaluation of patient's response to treatment, examination of patient, obtaining history from patient or surrogate, ordering and performing treatments and interventions, ordering and review of laboratory studies, ordering and review of radiographic studies, pulse oximetry and re-evaluation of patient's condition.     MDM Rules/Calculators/A&P  Admit.     Final Clinical Impression(s) / ED Diagnoses Final diagnoses:  Umbilical hernia with gangrene    Rx / DC Orders ED Discharge Orders    None       Renne Crigler, PA-C 08/12/19 1602    Margarita Grizzle, MD 08/12/19 [phone]

## 2019-08-12 NOTE — ED Triage Notes (Signed)
Pt reports that she has been having abd pain where her hernia is since 2013 and for the past 2 days she has been having body aches, fevers, and SOB

## 2019-08-12 NOTE — H&P (Signed)
Victoria Chavez is an 39 y.o. female.   Chief Complaint: Abdominal pain HPI: Patient is a 39 year old female who has a history of what appears to be laparoscopic umbilical hernia repair with mesh.  Patient has a history of morbid obesity.  Patient states that over the last 3 days she had incarceration of her hernia.  She states that prior to this she was able to reduce this on her own.  She states that she had pain.  Patient's had no nausea vomiting.  She states her last bowel movement was this morning.  Patient has had no current imaging.  Patient states that her last hernia repair was done in 2006 in [place].  She states they placed mesh at that time.  Past Medical History:  Diagnosis Date  . Abdominal hernia   . Morbid obesity (HCC)     Past Surgical History:  Procedure Laterality Date  . HERNIA REPAIR      History reviewed. No pertinent family history. Social History:  reports that she has been smoking cigarettes. She has been smoking about 0.50 packs per day. She has never used smokeless tobacco. She reports current alcohol use. She reports current drug use. Frequency: 14.00 times per week. Drug: Marijuana.  Allergies: No Known Allergies  (Not in a hospital admission)   Results for orders placed or performed during the hospital encounter of 08/12/19 (from the past 48 hour(s))  Urinalysis, Routine w reflex microscopic     Status: Abnormal   Collection Time: 08/12/19  5:35 AM  Result Value Ref Range   Color, Urine YELLOW YELLOW   APPearance CLOUDY (A) CLEAR   Specific Gravity, Urine 1.014 1.005 - 1.030   pH 6.0 5.0 - 8.0   Glucose, UA NEGATIVE NEGATIVE mg/dL   Hgb urine dipstick SMALL (A) NEGATIVE   Bilirubin Urine NEGATIVE NEGATIVE   Ketones, ur NEGATIVE NEGATIVE mg/dL   Protein, ur 646 (A) NEGATIVE mg/dL   Nitrite NEGATIVE NEGATIVE   Leukocytes,Ua TRACE (A) NEGATIVE   RBC / HPF 0-5 0 - 5 RBC/hpf   WBC, UA 11-20 0 - 5 WBC/hpf   Bacteria, UA RARE (A) NONE SEEN   Squamous  Epithelial / LPF 21-50 0 - 5   Mucus PRESENT    Hyaline Casts, UA PRESENT     Comment: Performed at [facility], [address], [place], [place] [address]  Lipase, blood     Status: None   Collection Time: 08/12/19  6:05 AM  Result Value Ref Range   Lipase 15 11 - 51 U/L    Comment: Performed at [facility], [address] [place], [place] [address]  Comprehensive metabolic panel     Status: Abnormal   Collection Time: 08/12/19  6:05 AM  Result Value Ref Range   Sodium 135 135 - 145 mmol/L   Potassium 3.1 (L) 3.5 - 5.1 mmol/L   Chloride 100 98 - 111 mmol/L   CO2 22 22 - 32 mmol/L   Glucose, Bld 221 (H) 70 - 99 mg/dL    Comment: Glucose reference range applies only to samples taken after fasting for at least 8 hours.   BUN 7 6 - 20 mg/dL   Creatinine, Ser 5.00 (H) 0.44 - 1.00 mg/dL   Calcium 8.6 (L) 8.9 - 10.3 mg/dL   Total Protein 7.2 6.5 - 8.1 g/dL   Albumin 3.0 (L) 3.5 - 5.0 g/dL   AST 24 15 - 41 U/L   ALT 17 0 - 44  U/L   Alkaline Phosphatase 82 38 - 126 U/L   Total Bilirubin 1.5 (H) 0.3 - 1.2 mg/dL   GFR calc non Af Amer >60 >60 mL/min   GFR calc Af Amer >60 >60 mL/min   Anion gap 13 5 - 15    Comment: Performed at [facility] [address], [place], [place] [address]  CBC     Status: Abnormal   Collection Time: 08/12/19  6:05 AM  Result Value Ref Range   WBC 35.5 (H) 4.0 - 10.5 K/uL   RBC 4.92 3.87 - 5.11 MIL/uL   Hemoglobin 9.5 (L) 12.0 - 15.0 g/dL   HCT 32.1 (L) 36.0 - 46.0 %   MCV 65.2 (L) 80.0 - 100.0 fL   MCH 19.3 (L) 26.0 - 34.0 pg   MCHC 29.6 (L) 30.0 - 36.0 g/dL   RDW 18.6 (H) 11.5 - 15.5 %   Platelets 390 150 - 400 K/uL   nRBC 0.0 0.0 - 0.2 %    Comment: Performed at [facility] [address], [place], [place] [address]  I-Stat beta hCG blood, ED     Status: None   Collection Time: 08/12/19  6:31 AM  Result Value Ref Range   I-stat hCG, quantitative <5.0 <5 mIU/mL   Comment 3            Comment:   GEST. AGE      CONC.   (mIU/mL)   <=1 WEEK        5 - 50     2 WEEKS       50 - 500     3 WEEKS       100 - 10,000     4 WEEKS     1,000 - 30,000        FEMALE AND NON-PREGNANT FEMALE:     LESS THAN 5 mIU/mL   Lactic acid, plasma     Status: None   Collection Time: 08/12/19  8:57 AM  Result Value Ref Range   Lactic Acid, Venous 1.5 0.5 - 1.9 mmol/L    Comment: Performed at [facility] [address] [place], [place] [address]  APTT     Status: None   Collection Time: 08/12/19  8:57 AM  Result Value Ref Range   aPTT 36 24 - 36 seconds    Comment: Performed at [facility] [address], [place], [place] [address]  Protime-INR     Status: Abnormal   Collection Time: 08/12/19  8:57 AM  Result Value Ref Range   Prothrombin Time 17.5 (H) 11.4 - 15.2 seconds   INR 1.5 (H) 0.8 - 1.2    Comment: (NOTE) INR goal varies based on device and disease states. Performed at [facility], [place] [address], [place],  [address]   Hemoglobin A1c     Status: Abnormal   Collection Time: 08/12/19  9:06 AM  Result Value Ref Range   Hgb A1c MFr Bld 6.9 (H) 4.8 - 5.6 %    Comment: (NOTE) Pre diabetes:          5.7%-6.4% Diabetes:              >6.4% Glycemic control for   <7.0% adults with diabetes    Mean Plasma Glucose 151.33 mg/dL    Comment: Performed at [facility] [address], [place],  [address]  Respiratory Panel by RT PCR (Flu A&B, Covid) -  Nasopharyngeal Swab     Status: None   Collection Time: 08/12/19  9:11 AM   Specimen: Nasopharyngeal Swab  Result Value Ref Range   SARS Coronavirus 2 by RT PCR NEGATIVE NEGATIVE    Comment: (NOTE) SARS-CoV-2 target nucleic acids are NOT DETECTED. The SARS-CoV-2 RNA is generally detectable in upper respiratoy specimens during the acute phase of infection. The lowest concentration of SARS-CoV-2 viral copies this assay can detect is 131 copies/mL. A negative result does not preclude SARS-Cov-2 infection and should not be used  as the sole basis for treatment or other patient management decisions. A negative result may occur with  improper specimen collection/handling, submission of specimen other than nasopharyngeal swab, presence of viral mutation(s) within the areas targeted by this assay, and inadequate number of viral copies (<131 copies/mL). A negative result must be combined with clinical observations, patient history, and epidemiological information. The expected result is Negative. Fact Sheet for Patients:  https://www.moore.com/ Fact Sheet for Healthcare Providers:  https://www.young.biz/ This test is not yet ap proved or cleared by the [place] FDA and  has been authorized for detection and/or diagnosis of SARS-CoV-2 by FDA under an Emergency Use Authorization (EUA). This EUA will remain  in effect (meaning this test can be used) for the duration of the COVID-19 declaration under Section 564(b)(1) of the Act, 21 U.S.C. section 360bbb-3(b)(1), unless the authorization is terminated or revoked sooner.    Influenza A by PCR NEGATIVE NEGATIVE   Influenza B by PCR NEGATIVE NEGATIVE    Comment: (NOTE) The Xpert Xpress SARS-CoV-2/FLU/RSV assay is intended as an aid in  the diagnosis of influenza from Nasopharyngeal swab specimens and  should not be used as a sole basis for treatment. Nasal washings and  aspirates are unacceptable for Xpert Xpress SARS-CoV-2/FLU/RSV  testing. Fact Sheet for Patients: https://www.moore.com/ Fact Sheet for Healthcare Providers: https://www.young.biz/ This test is not yet approved or cleared by the [place] FDA and  has been authorized for detection and/or diagnosis of SARS-CoV-2 by  FDA under an Emergency Use Authorization (EUA). This EUA will remain  in effect (meaning this test can be used) for the duration of the  Covid-19 declaration under Section 564(b)(1) of the Act, 21  U.S.C.  section 360bbb-3(b)(1), unless the authorization is  terminated or revoked. Performed at [facility], [address], [place], [place] [address]   CBC with Differential/Platelet     Status: Abnormal (Preliminary result)   Collection Time: 08/12/19  9:26 AM  Result Value Ref Range   WBC 35.3 (H) 4.0 - 10.5 K/uL    Comment: REPEATED TO VERIFY   RBC 4.86 3.87 - 5.11 MIL/uL   Hemoglobin 9.5 (L) 12.0 - 15.0 g/dL   HCT 52.8 (L) 41.3 - 24.4 %   MCV 66.7 (L) 80.0 - 100.0 fL   MCH 19.5 (L) 26.0 - 34.0 pg   MCHC 29.3 (L) 30.0 - 36.0 g/dL   RDW 01.0 (H) 27.2 - 53.6 %   Platelets 393 150 - 400 K/uL   nRBC 0.0 0.0 - 0.2 %    Comment: Performed at [facility], [address] [place], [place] [address]   Neutrophils Relative % PENDING %   Neutro Abs PENDING 1.7 - 7.7 K/uL   Band Neutrophils PENDING %   Lymphocytes Relative PENDING %   Lymphs Abs PENDING 0.7 - 4.0 K/uL   Monocytes Relative PENDING %   Monocytes Absolute PENDING 0.1 - 1.0 K/uL   Eosinophils Relative  PENDING %   Eosinophils Absolute PENDING 0.0 - 0.5 K/uL   Basophils Relative PENDING %   Basophils Absolute PENDING 0.0 - 0.1 K/uL   WBC Morphology PENDING    RBC Morphology PENDING    Smear Review PENDING    Other PENDING %   nRBC PENDING 0 /100 WBC   Metamyelocytes Relative PENDING %   Myelocytes PENDING %   Promyelocytes Relative PENDING %   Blasts PENDING %   Immature Granulocytes PENDING %   Abs Immature Granulocytes PENDING 0.00 - 0.07 K/uL   CT ABDOMEN PELVIS W CONTRAST  Result Date: 08/12/2019 CLINICAL DATA:  Incarcerated umbilical hernia.  Abdominal pain. EXAM: CT ABDOMEN AND PELVIS WITH CONTRAST TECHNIQUE: Multidetector CT imaging of the abdomen and pelvis was performed using the standard protocol following bolus administration of intravenous contrast. CONTRAST:  100mL OMNIPAQUE IOHEXOL 300 MG/ML  SOLN COMPARISON:  None. FINDINGS: Lower chest: Lung bases are clear. No effusions. Heart is normal size.  Hepatobiliary: Diffuse low-density throughout the liver compatible with fatty infiltration. No focal abnormality. Multiple gallstones within the gallbladder. Pancreas: No focal abnormality or ductal dilatation. Spleen: No focal abnormality.  Normal size. Adrenals/Urinary Tract: Nonobstructing stone in the midpole of the left kidney measures 6 mm. Small exophytic cyst off the midpole of the left kidney posteriorly. No hydronephrosis. Adrenal glands and urinary bladder unremarkable. Stomach/Bowel: Portion of the mid transverse colon seen within a large umbilical hernia along with fat. Stranding noted within the fat in the hernia concerning for incarceration. Normal appendix. No evidence of bowel obstruction. Stomach and small bowel decompressed, unremarkable. Vascular/Lymphatic: Shotty retroperitoneal lymph nodes, none pathologically enlarged. No aortic aneurysm. Reproductive: Uterus and adnexa unremarkable.  No mass. Other: No free fluid or free air. Musculoskeletal: No acute bony abnormality. IMPRESSION: Large umbilical hernia containing fat and mid transverse colon. No bowel obstruction. There is stranding within the umbilical hernia and surrounding the bili cul hernia concerning for incarceration. Cholelithiasis. Left nephrolithiasis.  No hydronephrosis. Electronically Signed   By: Charlett NoseKevin  Dover M.D.   On: 08/12/2019 11:12   DG Chest Portable 1 View  Result Date: 08/12/2019 CLINICAL DATA:  Shortness of breath. Abdominal pain. Body aches and fevers. EXAM: PORTABLE CHEST 1 VIEW COMPARISON:  No prior. FINDINGS: Mediastinum and hilar structures normal. Cardiomegaly. Normal pulmonary vascularity. Low lung volumes. Mild bibasilar interstitial prominence cannot be excluded. Mild pneumonitis/interstitial edema cannot be excluded. No pleural effusion or pneumothorax. No acute bony abnormality. Degenerative change thoracic spine. IMPRESSION: 1.  Cardiomegaly.  No pulmonary venous congestion. 2. Low lung volumes. Mild  bibasilar interstitial prominence cannot be excluded. Mild pneumonitis/interstitial edema cannot be excluded. Electronically Signed   By: Maisie Fushomas  Register   On: 08/12/2019 06:15    Review of Systems  Constitutional: Negative for chills and fever.  HENT: Negative for ear discharge, hearing loss and sore throat.   Eyes: Negative for discharge.  Respiratory: Negative for cough and shortness of breath.   Cardiovascular: Negative for chest pain and leg swelling.  Gastrointestinal: Positive for abdominal pain. Negative for constipation, diarrhea, nausea and vomiting.  Musculoskeletal: Negative for myalgias and neck pain.  Skin: Negative for rash.  Allergic/Immunologic: Negative for environmental allergies.  Neurological: Negative for dizziness and seizures.  Hematological: Does not bruise/bleed easily.  Psychiatric/Behavioral: Negative for suicidal ideas.  All other systems reviewed and are negative.   Blood pressure 123/62, pulse (!) 112, temperature (!) 102.9 F (39.4 C), temperature source Oral, resp. rate (!) 22, height 5\' 7"  (1.702 m), weight Marland Kitchen(!)  158.8 kg, SpO2 99 %. Physical Exam  Constitutional: She is oriented to person, place, and time. Vital signs are normal. She appears well-developed and well-nourished.  Conversant No acute distress  Eyes: Lids are normal. No scleral icterus.  Pupils are equal round and reactive No lid lag Moist conjunctiva  Neck: No tracheal tenderness present. No thyromegaly present.  No cervical lymphadenopathy  Cardiovascular: Normal rate, regular rhythm and intact distal pulses.  No murmur heard. Respiratory: Effort normal and breath sounds normal. She has no wheezes. She has no rales.  GI: Soft. There is no hepatosplenomegaly. There is no abdominal tenderness. A hernia is present. Hernia confirmed positive in the ventral area (at umb, ttp, and tight).  Neurological: She is alert and oriented to person, place, and time.  Normal gait and station  Skin:  Skin is warm. No rash noted. No cyanosis. Nails show no clubbing.  Normal skin turgor  Psychiatric: Judgment normal.  Appropriate affect     Assessment/Plan 39 year old female with likely incarcerated possible strangulated umbilical hernia  1.  We will await CT imaging to assess for small bowel incarceration. 2.  Preoperative antibiotics, 2 g Cefotan. 3.  I discussed with her we will left and there is laparoscopic versus open.  This may or may not require bowel resection. 4. All risks and benefits were discussed with the patient, to generally include infection, bleeding, damage to surrounding structures, acute and chronic nerve pain, and recurrence. Alternatives were offered and described.  All questions were answered and the patient voiced understanding of the procedure and wishes to proceed at this point.     Axel Filler, MD 08/12/2019, 12:23 PM

## 2019-08-12 NOTE — Anesthesia Preprocedure Evaluation (Addendum)
Anesthesia Evaluation  Patient identified by MRN, date of birth, ID band Patient awake    Reviewed: Allergy & Precautions, NPO status , Patient's Chart, lab work & pertinent test results  Airway Mallampati: III  TM Distance: >3 FB Neck ROM: Full    Dental no notable dental hx. (+) Teeth Intact, Dental Advisory Given   Pulmonary Current Smoker,    Pulmonary exam normal breath sounds clear to auscultation       Cardiovascular negative cardio ROS Normal cardiovascular exam Rhythm:Regular Rate:Normal     Neuro/Psych negative neurological ROS  negative psych ROS   GI/Hepatic negative GI ROS, Neg liver ROS,   Endo/Other  negative endocrine ROSMorbid obesity  Renal/GU negative Renal ROS  negative genitourinary   Musculoskeletal negative musculoskeletal ROS (+) Incarcerated umbilical hernia   Abdominal (+) + obese,   Peds  Hematology  (+) anemia ,   Anesthesia Other Findings   Reproductive/Obstetrics                            Anesthesia Physical Anesthesia Plan  ASA: III and emergent  Anesthesia Plan: General   Post-op Pain Management:    Induction: Intravenous  PONV Risk Score and Plan: 4 or greater and Midazolam, Ondansetron, Dexamethasone and Treatment may vary due to age or medical condition  Airway Management Planned: Oral ETT  Additional Equipment:   Intra-op Plan:   Post-operative Plan: Extubation in OR  Informed Consent: I have reviewed the patients History and Physical, chart, labs and discussed the procedure including the risks, benefits and alternatives for the proposed anesthesia with the patient or authorized representative who has indicated his/her understanding and acceptance.     Dental advisory given  Plan Discussed with: CRNA and Surgeon  Anesthesia Plan Comments:         Anesthesia Quick Evaluation

## 2019-08-13 LAB — BASIC METABOLIC PANEL
Anion gap: 8 (ref 5–15)
BUN: 9 mg/dL (ref 6–20)
CO2: 25 mmol/L (ref 22–32)
Calcium: 8.1 mg/dL — ABNORMAL LOW (ref 8.9–10.3)
Chloride: 106 mmol/L (ref 98–111)
Creatinine, Ser: 0.91 mg/dL (ref 0.44–1.00)
GFR calc Af Amer: >60 mL/min (ref >60)
GFR calc non Af Amer: >60 mL/min (ref >60)
Glucose, Bld: 186 mg/dL — ABNORMAL HIGH (ref 70–99)
Potassium: 3.4 mmol/L — ABNORMAL LOW (ref 3.5–5.1)
Sodium: 139 mmol/L (ref 135–145)

## 2019-08-13 LAB — GLUCOSE, CAPILLARY
Glucose-Capillary: 190 mg/dL — ABNORMAL HIGH (ref 70–99)
Glucose-Capillary: 152 mg/dL — ABNORMAL HIGH (ref 70–99)
Glucose-Capillary: 143 mg/dL — ABNORMAL HIGH (ref 70–99)

## 2019-08-13 LAB — HIV ANTIBODY (ROUTINE TESTING W REFLEX): HIV Screen 4th Generation wRfx: NONREACTIVE

## 2019-08-13 LAB — CBC
HCT: 31.8 % — ABNORMAL LOW (ref 36.0–46.0)
Hemoglobin: 9.1 g/dL — ABNORMAL LOW (ref 12.0–15.0)
MCH: 18.8 pg — ABNORMAL LOW (ref 26.0–34.0)
MCHC: 28.6 g/dL — ABNORMAL LOW (ref 30.0–36.0)
MCV: 65.8 fL — ABNORMAL LOW (ref 80.0–100.0)
Platelets: 353 10*3/uL (ref 150–400)
RBC: 4.83 MIL/uL (ref 3.87–5.11)
RDW: 18.6 % — ABNORMAL HIGH (ref 11.5–15.5)
WBC: 28.1 10*3/uL — ABNORMAL HIGH (ref 4.0–10.5)
nRBC: 0 % (ref 0.0–0.2)

## 2019-08-13 LAB — SURGICAL PATHOLOGY

## 2019-08-13 LAB — PATHOLOGIST SMEAR REVIEW

## 2019-08-13 MED ORDER — INSULIN ASPART 100 UNIT/ML ~~LOC~~ SOLN
0.0000 [IU] | Freq: Three times a day (TID) | SUBCUTANEOUS | Status: DC
  Administered 2019-08-13 (×2): 3 [IU] via SUBCUTANEOUS
  Administered 2019-08-14: 2 [IU] via SUBCUTANEOUS
  Administered 2019-08-14: 3 [IU] via SUBCUTANEOUS
  Administered 2019-08-14: 2 [IU] via SUBCUTANEOUS

## 2019-08-13 NOTE — Discharge Instructions (Signed)
[facility]      [facility] °[phone] ° °OPEN ABDOMINAL SURGERY: POST OP INSTRUCTIONS ° °Always review your discharge instruction sheet given to you by the facility where your surgery was performed. ° °IF YOU HAVE DISABILITY OR FAMILY LEAVE FORMS, YOU MUST BRING THEM TO THE OFFICE FOR PROCESSING.  PLEASE DO NOT GIVE THEM TO YOUR DOCTOR. ° °1. A prescription for pain medication may be given to you upon discharge.  Take your pain medication as prescribed, if needed.  If narcotic pain medicine is not needed, then you may take acetaminophen (Tylenol) or ibuprofen (Advil) as needed. °2. Take your usually prescribed medications unless otherwise directed. °3. If you need a refill on your pain medication, please contact your pharmacy. They will contact our office to request authorization.  Prescriptions will not be filled after 5pm or on week-ends. °4. You should follow a light diet the first few days after arrival home, such as soup and crackers, pudding, etc.unless your doctor has advised otherwise. A high-fiber, low fat diet can be resumed as tolerated.   Be sure to include lots of fluids daily. Most patients will experience some swelling and bruising on the chest and neck area.  Ice packs will help.  Swelling and bruising can take several days to resolve °5. Most patients will experience some swelling and bruising in the area of the incision. Ice pack will help. Swelling and bruising can take several days to resolve..  °6. It is common to experience some constipation if taking pain medication after surgery.  Increasing fluid intake and taking a stool softener will usually help or prevent this problem from occurring.  A mild laxative (Milk of Magnesia or Miralax) should be taken according to package directions if there are no bowel movements after 48 hours. °7.  You may have steri-strips (small skin tapes) in place directly over the incision.  These strips should be left on the skin for 7-10 days.  If your  surgeon used skin glue on the incision, you may shower in 24 hours.  The glue will flake off over the next 2-3 weeks.  Any sutures or staples will be removed at the office during your follow-up visit. You may find that a light gauze bandage over your incision may keep your staples from being rubbed or pulled. You may shower and replace the bandage daily. °8. ACTIVITIES:  You may resume regular (light) daily activities beginning the next day--such as daily self-care, walking, climbing stairs--gradually increasing activities as tolerated.  You may have sexual intercourse when it is comfortable.  Refrain from any heavy lifting or straining until approved by your doctor. °a. You may drive when you no longer are taking prescription pain medication, you can comfortably wear a seatbelt, and you can safely maneuver your car and apply brakes °b. Return to Work: ___________________________________ °9. You should see your doctor in the office for a follow-up appointment approximately two weeks after your surgery.  Make sure that you call for this appointment within a day or two after you arrive home to insure a convenient appointment time. °OTHER INSTRUCTIONS:  °_____________________________________________________________ °_____________________________________________________________ ° °WHEN TO CALL YOUR DOCTOR: °1. Fever over 101.0 °2. Inability to urinate °3. Nausea and/or vomiting °4. Extreme swelling or bruising °5. Continued bleeding from incision. °6. Increased pain, redness, or drainage from the incision. °7. Difficulty swallowing or breathing °8. Muscle cramping or spasms. °9. Numbness or tingling in hands or feet or around lips. ° °The clinic staff is available to   answer your questions during regular business hours.  Please don’t hesitate to call and ask to speak to one of the nurses if you have concerns. ° °For further questions, please visit [email or web address] ° °••••••••• ° ° °Managing Your Pain After  Surgery Without Opioids ° ° ° °Thank you for participating in our program to help patients manage their pain after surgery without opioids. This is part of our effort to provide you with the best care possible, without exposing you or your family to the risk that opioids pose. ° °What pain can I expect after surgery? °You can expect to have some pain after surgery. This is normal. The pain is typically worse the day after surgery, and quickly begins to get better. °Many studies have found that many patients are able to manage their pain after surgery with Over-the-Counter (OTC) medications such as Tylenol and Motrin. If you have a condition that does not allow you to take Tylenol or Motrin, notify your surgical team. ° °How will I manage my pain? °The best strategy for controlling your pain after surgery is around the clock pain control with Tylenol (acetaminophen) and Motrin (ibuprofen or Advil). Alternating these medications with each other allows you to maximize your pain control. In addition to Tylenol and Motrin, you can use heating pads or ice packs on your incisions to help reduce your pain. ° °How will I alternate your regular strength over-the-counter pain medication? °You will take a dose of pain medication every three hours. °; Start by taking 650 mg of Tylenol (2 pills of 325 mg) °; 3 hours later take 600 mg of Motrin (3 pills of 200 mg) °; 3 hours after taking the Motrin take 650 mg of Tylenol °; 3 hours after that take 600 mg of Motrin. ° ° °- 1 - ° °See example - if your first dose of Tylenol is at 12:00 PM ° ° °12:00 PM Tylenol 650 mg (2 pills of 325 mg)  °3:00 PM Motrin 600 mg (3 pills of 200 mg)  °6:00 PM Tylenol 650 mg (2 pills of 325 mg)  °9:00 PM Motrin 600 mg (3 pills of 200 mg)  °Continue alternating every 3 hours  ° °We recommend that you follow this schedule around-the-clock for at least 3 days after surgery, or until you feel that it is no longer needed. Use the table on the last page of  this handout to keep track of the medications you are taking. °Important: °Do not take more than 3000mg of Tylenol or 3200mg of Motrin in a 24-hour period. °Do not take ibuprofen/Motrin if you have a history of bleeding stomach ulcers, severe kidney disease, &/or actively taking a blood thinner ° °What if I still have pain? °If you have pain that is not controlled with the over-the-counter pain medications (Tylenol and Motrin or Advil) you might have what we call “breakthrough” pain. You will receive a prescription for a small amount of an opioid pain medication such as Oxycodone, Tramadol, or Tylenol with Codeine. Use these opioid pills in the first 24 hours after surgery if you have breakthrough pain. Do not take more than 1 pill every 4-6 hours. ° °If you still have uncontrolled pain after using all opioid pills, don't hesitate to call our staff using the number provided. We will help make sure you are managing your pain in the best way possible, and if necessary, we can provide a prescription for additional pain medication. ° ° °Day 1   ° °Time  °  Name of Medication Number of pills taken  °Amount of Acetaminophen  °Pain Level  ° °Comments  °AM PM       °AM PM       °AM PM       °AM PM       °AM PM       °AM PM       °AM PM       °AM PM       °Total Daily amount of Acetaminophen °Do not take more than  3,000 mg per day    ° ° °Day 2   ° °Time  °Name of Medication Number of pills °taken  °Amount of Acetaminophen  °Pain Level  ° °Comments  °AM PM       °AM PM       °AM PM       °AM PM       °AM PM       °AM PM       °AM PM       °AM PM       °Total Daily amount of Acetaminophen °Do not take more than  3,000 mg per day    ° ° °Day 3   ° °Time  °Name of Medication Number of pills taken  °Amount of Acetaminophen  °Pain Level  ° °Comments  °AM PM       °AM PM       °AM PM       °AM PM       ° ° ° °AM PM       °AM PM       °AM PM       °AM PM       °Total Daily amount of Acetaminophen °Do not take more than  3,000 mg per  day    ° ° °Day 4   ° °Time  °Name of Medication Number of pills taken  °Amount of Acetaminophen  °Pain Level  ° °Comments  °AM PM       °AM PM       °AM PM       °AM PM       °AM PM       °AM PM       °AM PM       °AM PM       °Total Daily amount of Acetaminophen °Do not take more than  3,000 mg per day    ° ° °Day 5   ° °Time  °Name of Medication Number °of pills taken  °Amount of Acetaminophen  °Pain Level  ° °Comments  °AM PM       °AM PM       °AM PM       °AM PM       °AM PM       °AM PM       °AM PM       °AM PM       °Total Daily amount of Acetaminophen °Do not take more than  3,000 mg per day    ° ° ° °Day 6   ° °Time  °Name of Medication Number of pills °taken  °Amount of Acetaminophen  °Pain Level  °Comments  °AM PM       °AM PM       °AM PM       °AM PM       °AM   PM       °AM PM       °AM PM       °AM PM       °Total Daily amount of Acetaminophen °Do not take more than  3,000 mg per day    ° ° °Day 7   ° °Time  °Name of Medication Number of pills taken  °Amount of Acetaminophen  °Pain Level  ° °Comments  °AM PM       °AM PM       °AM PM       °AM PM       °AM PM       °AM PM       °AM PM       °AM PM       °Total Daily amount of Acetaminophen °Do not take more than  3,000 mg per day    ° ° ° ° °For additional information about how and where to safely dispose of unused opioid °medications - https://www.[employer]ful[employer].org ° °Disclaimer: This document contains information and/or instructional materials adapted from [facility] for the typical patient with your condition. It does not replace medical advice from your health care provider because your experience may differ from that of the °typical patient. Talk to your health care provider if you have any questions about this °document, your condition or your treatment plan. °Adapted from [facility] ° °    JP Drain Totals °· Bring this sheet to all of your post-operative appointments while you have your drains. °· Please measure your drains by  CC's or ML's. °· Make sure you drain and measure your JP Drains 3 times per day. °· At the end of each day, add up totals for the left side and add up totals for the right side. °   ( 9 am )     ( 3 pm )        ( 9 pm )                °Date L  R  L  R  L  R  Total L/R  °               °               °               °               °               °               °               °               °               °               °               °               ° °

## 2019-08-13 NOTE — Anesthesia Postprocedure Evaluation (Signed)
Anesthesia Post Note  Patient: [name]  Procedure(s) Performed: EXPLORATORY LAPAROTOMY (N/A Abdomen) Recurrent incisional hernia repair (N/A Abdomen) Partial Omentectomy (N/A Abdomen)     Patient location during evaluation: PACU Anesthesia Type: General Level of consciousness: awake and alert Pain management: pain level controlled Vital Signs Assessment: post-procedure vital signs reviewed and stable Respiratory status: spontaneous breathing, nonlabored ventilation, respiratory function stable and patient connected to nasal cannula oxygen Cardiovascular status: blood pressure returned to baseline and stable Postop Assessment: no apparent nausea or vomiting Anesthetic complications: no    Last Vitals:  Vitals:   08/12/19 2100 08/13/19 0437  BP: 127/76 108/68  Pulse: (!) 103 89  Resp: 19 18  Temp: 36.9 C 36.5 C  SpO2: 98% 100%    Last Pain:  Vitals:   08/13/19 0442  TempSrc:   PainSc: Asleep                 Latice Waitman S

## 2019-08-13 NOTE — Progress Notes (Signed)
Patient ID: Victoria Chavez, female   DOB: Oct 09, 1980, 39 y.o.   MRN: [id number]    1 Day Post-Op  Subjective: Patient doing fairly well today.  Having some liquids, doesn't want much more than that.  No nausea.  Abdominal pain but seems well controlled.  Up ambulating in her room when I arrived.  Fever of 102.9 overnight.  ROS: See above, otherwise other systems negative  Objective: Vital signs in last 24 hours: Temp:  [97 F (36.1 C)-102.9 F (39.4 C)] 97.7 F (36.5 C) (04/16 0437) Pulse Rate:  [62-114] 89 (04/16 0437) Resp:  [16-34] 18 (04/16 0437) BP: (102-143)/(42-76) 108/68 (04/16 0437) SpO2:  [92 %-100 %] 100 % (04/16 0437) Last BM Date: 08/12/19  Intake/Output from previous day: 04/15 0701 - 04/16 0700 In: 3248.9 [P.O.:200; I.V.:1898.9; IV Piggyback:1150] Out: 104 [Drains:74; Blood:30] Intake/Output this shift: No intake/output data recorded.  PE: Abd: morbidly obese, +BS, incision with some serosang drainage leaking from honeycomb dressing, JP drain in place with minimal serosang output noted.  Appropriately tender.  Lab Results:  Recent Labs    08/12/19 0926 08/13/19 0316  WBC 35.3* 28.1*  HGB 9.5* 9.1*  HCT 32.4* 31.8*  PLT 393 353   BMET Recent Labs    08/12/19 0605 08/13/19 0316  NA 135 139  K 3.1* 3.4*  CL 100 106  CO2 22 25  GLUCOSE 221* 186*  BUN 7 9  CREATININE 1.10* 0.91  CALCIUM 8.6* 8.1*   PT/INR Recent Labs    08/12/19 0857  LABPROT 17.5*  INR 1.5*   CMP     Component Value Date/Time   NA 139 08/13/2019 0316   K 3.4 (L) 08/13/2019 0316   CL 106 08/13/2019 0316   CO2 25 08/13/2019 0316   GLUCOSE 186 (H) 08/13/2019 0316   BUN 9 08/13/2019 0316   CREATININE 0.91 08/13/2019 0316   CALCIUM 8.1 (L) 08/13/2019 0316   PROT 7.2 08/12/2019 0605   ALBUMIN 3.0 (L) 08/12/2019 0605   AST 24 08/12/2019 0605   ALT 17 08/12/2019 0605   ALKPHOS 82 08/12/2019 0605   BILITOT 1.5 (H) 08/12/2019 0605   GFRNONAA >60 08/13/2019 0316   GFRAA >60  08/13/2019 0316   Lipase     Component Value Date/Time   LIPASE 15 08/12/2019 0605       Studies/Results: CT ABDOMEN PELVIS W CONTRAST  Result Date: 08/12/2019 CLINICAL DATA:  Incarcerated umbilical hernia.  Abdominal pain. EXAM: CT ABDOMEN AND PELVIS WITH CONTRAST TECHNIQUE: Multidetector CT imaging of the abdomen and pelvis was performed using the standard protocol following bolus administration of intravenous contrast. CONTRAST:  175mL OMNIPAQUE IOHEXOL 300 MG/ML  SOLN COMPARISON:  None. FINDINGS: Lower chest: Lung bases are clear. No effusions. Heart is normal size. Hepatobiliary: Diffuse low-density throughout the liver compatible with fatty infiltration. No focal abnormality. Multiple gallstones within the gallbladder. Pancreas: No focal abnormality or ductal dilatation. Spleen: No focal abnormality.  Normal size. Adrenals/Urinary Tract: Nonobstructing stone in the midpole of the left kidney measures 6 mm. Small exophytic cyst off the midpole of the left kidney posteriorly. No hydronephrosis. Adrenal glands and urinary bladder unremarkable. Stomach/Bowel: Portion of the mid transverse colon seen within a large umbilical hernia along with fat. Stranding noted within the fat in the hernia concerning for incarceration. Normal appendix. No evidence of bowel obstruction. Stomach and small bowel decompressed, unremarkable. Vascular/Lymphatic: Shotty retroperitoneal lymph nodes, none pathologically enlarged. No aortic aneurysm. Reproductive: Uterus and adnexa unremarkable.  No mass. Other: No free  fluid or free air. Musculoskeletal: No acute bony abnormality. IMPRESSION: Large umbilical hernia containing fat and mid transverse colon. No bowel obstruction. There is stranding within the umbilical hernia and surrounding the bili cul hernia concerning for incarceration. Cholelithiasis. Left nephrolithiasis.  No hydronephrosis. Electronically Signed   By: Charlett Nose M.D.   On: 08/12/2019 11:12   DG Chest  Portable 1 View  Result Date: 08/12/2019 CLINICAL DATA:  Shortness of breath. Abdominal pain. Body aches and fevers. EXAM: PORTABLE CHEST 1 VIEW COMPARISON:  No prior. FINDINGS: Mediastinum and hilar structures normal. Cardiomegaly. Normal pulmonary vascularity. Low lung volumes. Mild bibasilar interstitial prominence cannot be excluded. Mild pneumonitis/interstitial edema cannot be excluded. No pleural effusion or pneumothorax. No acute bony abnormality. Degenerative change thoracic spine. IMPRESSION: 1.  Cardiomegaly.  No pulmonary venous congestion. 2. Low lung volumes. Mild bibasilar interstitial prominence cannot be excluded. Mild pneumonitis/interstitial edema cannot be excluded. Electronically Signed   By: Maisie Fus  Register   On: 08/12/2019 06:15    Anti-infectives: Anti-infectives (From admission, onward)   Start     Dose/Rate Route Frequency Ordered Stop   08/12/19 1545  piperacillin-tazobactam (ZOSYN) IVPB 3.375 g     3.375 g 12.5 mL/hr over 240 Minutes Intravenous Every 8 hours 08/12/19 1540     08/12/19 1045  cefoTEtan (CEFOTAN) 2 g in sodium chloride 0.9 % 100 mL IVPB     2 g 200 mL/hr over 30 Minutes Intravenous On call to O.R. 08/12/19 1037 08/12/19 1325   08/12/19 0845  piperacillin-tazobactam (ZOSYN) IVPB 3.375 g     3.375 g 12.5 mL/hr over 240 Minutes Intravenous  Once 08/12/19 0833 08/12/19 1148       Assessment/Plan DM - hgbA1C of 6.9.  Will have patient follow up with PCP as outpatient for treatment  POD 1, s/p ex lap with umbilical hernia repair with omentectomy, primary repair and JP drain placement -the patient had purulent ascites in her hernia sac that was certainly the cause of her WBC and fevers. -adv diet today -mobilize, IS -pain control -IV zosyn -cont drain.  Will go home with drain.  FEN - carb mod diet VTE - lovenox ID - zosyn dispo - likely home tomorrow   LOS: 1 day    Letha Cape , [facility] 08/13/2019, 9:12  AM Please see Amion for pager number during day hours 7:00am-4:30pm or 7:00am -11:30am on weekends

## 2019-08-14 LAB — GLUCOSE, CAPILLARY
Glucose-Capillary: 167 mg/dL — ABNORMAL HIGH (ref 70–99)
Glucose-Capillary: 143 mg/dL — ABNORMAL HIGH (ref 70–99)
Glucose-Capillary: 132 mg/dL — ABNORMAL HIGH (ref 70–99)
Glucose-Capillary: 168 mg/dL — ABNORMAL HIGH (ref 70–99)

## 2019-08-14 LAB — CBC
HCT: 28 % — ABNORMAL LOW (ref 36.0–46.0)
Hemoglobin: 8.1 g/dL — ABNORMAL LOW (ref 12.0–15.0)
MCH: 18.6 pg — ABNORMAL LOW (ref 26.0–34.0)
MCHC: 28.9 g/dL — ABNORMAL LOW (ref 30.0–36.0)
MCV: 64.4 fL — ABNORMAL LOW (ref 80.0–100.0)
Platelets: 358 10*3/uL (ref 150–400)
RBC: 4.35 MIL/uL (ref 3.87–5.11)
RDW: 18.6 % — ABNORMAL HIGH (ref 11.5–15.5)
WBC: 27.1 10*3/uL — ABNORMAL HIGH (ref 4.0–10.5)
nRBC: 0 % (ref 0.0–0.2)

## 2019-08-14 NOTE — Progress Notes (Signed)
Patient ID: Victoria Chavez, female   DOB: 1980/05/02, 39 y.o.   MRN: [id number]    2 Days Post-Op  Subjective: Patient doing fairly well today.  Some serous drainage from wound  ROS: See above, otherwise other systems negative  Objective: Vital signs in last 24 hours: Temp:  [98.2 F (36.8 C)-99.9 F (37.7 C)] 98.2 F (36.8 C) (04/17 0457) Pulse Rate:  [78-87] 79 (04/17 0457) Resp:  [16-18] 17 (04/17 0457) BP: (126-154)/(78-95) 126/78 (04/17 0457) SpO2:  [99 %-100 %] 100 % (04/17 0457) Last BM Date: 08/13/19  Intake/Output from previous day: 04/16 0701 - 04/17 0700 In: 1480.1 [P.O.:1280; IV Piggyback:200.1] Out: 55 [Drains:55] Intake/Output this shift: Total I/O In: 340 [P.O.:340] Out: 200 [Urine:200]  PE: Abd: morbidly obese, +BS, incision with some serosang drainage, no cellulitis, JP drain in place with minimal serosang output noted.  Appropriately tender.  Lab Results:  Recent Labs    08/13/19 0316 08/14/19 0617  WBC 28.1* 27.1*  HGB 9.1* 8.1*  HCT 31.8* 28.0*  PLT 353 358   BMET Recent Labs    08/12/19 0605 08/13/19 0316  NA 135 139  K 3.1* 3.4*  CL 100 106  CO2 22 25  GLUCOSE 221* 186*  BUN 7 9  CREATININE 1.10* 0.91  CALCIUM 8.6* 8.1*   PT/INR Recent Labs    08/12/19 0857  LABPROT 17.5*  INR 1.5*   CMP     Component Value Date/Time   NA 139 08/13/2019 0316   K 3.4 (L) 08/13/2019 0316   CL 106 08/13/2019 0316   CO2 25 08/13/2019 0316   GLUCOSE 186 (H) 08/13/2019 0316   BUN 9 08/13/2019 0316   CREATININE 0.91 08/13/2019 0316   CALCIUM 8.1 (L) 08/13/2019 0316   PROT 7.2 08/12/2019 0605   ALBUMIN 3.0 (L) 08/12/2019 0605   AST 24 08/12/2019 0605   ALT 17 08/12/2019 0605   ALKPHOS 82 08/12/2019 0605   BILITOT 1.5 (H) 08/12/2019 0605   GFRNONAA >60 08/13/2019 0316   GFRAA >60 08/13/2019 0316   Lipase     Component Value Date/Time   LIPASE 15 08/12/2019 0605       Studies/Results: CT ABDOMEN PELVIS W CONTRAST  Result Date:  08/12/2019 CLINICAL DATA:  Incarcerated umbilical hernia.  Abdominal pain. EXAM: CT ABDOMEN AND PELVIS WITH CONTRAST TECHNIQUE: Multidetector CT imaging of the abdomen and pelvis was performed using the standard protocol following bolus administration of intravenous contrast. CONTRAST:  OMNIPAQUE IOHEXOL 300 MG/ML  SOLN COMPARISON:  None. FINDINGS: Lower chest: Lung bases are clear. No effusions. Heart is normal size. Hepatobiliary: Diffuse low-density throughout the liver compatible with fatty infiltration. No focal abnormality. Multiple gallstones within the gallbladder. Pancreas: No focal abnormality or ductal dilatation. Spleen: No focal abnormality.  Normal size. Adrenals/Urinary Tract: Nonobstructing stone in the midpole of the left kidney measures 6 mm. Small exophytic cyst off the midpole of the left kidney posteriorly. No hydronephrosis. Adrenal glands and urinary bladder unremarkable. Stomach/Bowel: Portion of the mid transverse colon seen within a large umbilical hernia along with fat. Stranding noted within the fat in the hernia concerning for incarceration. Normal appendix. No evidence of bowel obstruction. Stomach and small bowel decompressed, unremarkable. Vascular/Lymphatic: Shotty retroperitoneal lymph nodes, none pathologically enlarged. No aortic aneurysm. Reproductive: Uterus and adnexa unremarkable.  No mass. Other: No free fluid or free air. Musculoskeletal: No acute bony abnormality. IMPRESSION: Large umbilical hernia containing fat and mid transverse colon. No bowel obstruction. There is stranding within the umbilical  hernia and surrounding the bili cul hernia concerning for incarceration. Cholelithiasis. Left nephrolithiasis.  No hydronephrosis. Electronically Signed   By: Rolm Baptise M.D.   On: 08/12/2019 11:12    Anti-infectives: Anti-infectives (From admission, onward)   Start     Dose/Rate Route Frequency Ordered Stop   08/12/19 1545  piperacillin-tazobactam (ZOSYN) IVPB  3.375 g     3.375 g 12.5 mL/hr over 240 Minutes Intravenous Every 8 hours 08/12/19 1540     08/12/19 1045  cefoTEtan (CEFOTAN) 2 g in sodium chloride 0.9 % 100 mL IVPB     2 g 200 mL/hr over 30 Minutes Intravenous On call to O.R. 08/12/19 1037 08/12/19 1325   08/12/19 0845  piperacillin-tazobactam (ZOSYN) IVPB 3.375 g     3.375 g 12.5 mL/hr over 240 Minutes Intravenous  Once 08/12/19 0833 08/12/19 1148       Assessment/Plan DM - hgbA1C of 6.9.  Will have patient follow up with PCP as outpatient for treatment  POD 2, s/p ex lap with umbilical hernia repair with omentectomy, primary repair and JP drain placement -the patient had purulent ascites in her hernia sac that was certainly the cause of her WBC and fevers. -adv diet today -mobilize, IS -pain control -IV zosyn -cont drain.  Will go home with drain.  FEN - carb mod diet VTE - lovenox ID - zosyn dispo - likely home tomorrow, not quite ready today   LOS: 2 days    Clovis Riley , [facility] 08/14/2019, 8:49 AM Please see Amion for pager number during day hours 7:00am-4:30pm or 7:00am -11:30am on weekends

## 2019-08-15 LAB — CBC
HCT: 27.2 % — ABNORMAL LOW (ref 36.0–46.0)
Hemoglobin: 8 g/dL — ABNORMAL LOW (ref 12.0–15.0)
MCH: 18.7 pg — ABNORMAL LOW (ref 26.0–34.0)
MCHC: 29.4 g/dL — ABNORMAL LOW (ref 30.0–36.0)
MCV: 63.6 fL — ABNORMAL LOW (ref 80.0–100.0)
Platelets: 390 10*3/uL (ref 150–400)
RBC: 4.28 MIL/uL (ref 3.87–5.11)
RDW: 18.7 % — ABNORMAL HIGH (ref 11.5–15.5)
WBC: 15.2 10*3/uL — ABNORMAL HIGH (ref 4.0–10.5)
nRBC: 0 % (ref 0.0–0.2)

## 2019-08-15 LAB — GLUCOSE, CAPILLARY: Glucose-Capillary: 165 mg/dL — ABNORMAL HIGH (ref 70–99)

## 2019-08-15 LAB — BASIC METABOLIC PANEL
Anion gap: 7 (ref 5–15)
BUN: 11 mg/dL (ref 6–20)
CO2: 24 mmol/L (ref 22–32)
Calcium: 8.1 mg/dL — ABNORMAL LOW (ref 8.9–10.3)
Chloride: 103 mmol/L (ref 98–111)
Creatinine, Ser: 0.69 mg/dL (ref 0.44–1.00)
GFR calc Af Amer: >60 mL/min (ref >60)
GFR calc non Af Amer: >60 mL/min (ref >60)
Glucose, Bld: 151 mg/dL — ABNORMAL HIGH (ref 70–99)
Potassium: 3.3 mmol/L — ABNORMAL LOW (ref 3.5–5.1)
Sodium: 134 mmol/L — ABNORMAL LOW (ref 135–145)

## 2019-08-15 LAB — MAGNESIUM: Magnesium: 2.1 mg/dL (ref 1.7–2.4)

## 2019-08-15 MED ORDER — OXYCODONE HCL 5 MG PO TABS: 5.0000 mg | ORAL_TABLET | ORAL | 0 refills | Status: DC | PRN

## 2019-08-15 NOTE — Discharge Summary (Signed)
Physician Discharge Summary  Patient ID: Victoria Chavez MRN: [id number] DOB/AGE: 09/17/1980 39 y.o.  Admit date: 08/12/2019 Discharge date: 08/15/2019  Admission Diagnoses:  Recurrent incarcerated incisional hernia  Discharge Diagnoses: Same Active Problems:   Incarcerated umbilical hernia   Discharged Condition: good  Hospital Course: Patient admitted 08/12/19 with a recurrent incarcerated umbilical hernia.  She had a previous laparoscopic mesh repair performed in [place].  She underwent emergent repair by Dr. Derrell Lolling on 4/15.  There was some purulent ascites in the hernia sac, but the colon appeared normal.  Some omentum was resected.  The fascial defect was primarily closed with suture and a drain was placed in the hernia sac.  She has improved over the last couple of days.  Her WBC is returning towards normal.  She has bowel function.  She is tolerating a diet.  The drain is putting out some serosanguinous drainage.  She has some serous drainage seeping through her staple line, but no other sign of infection.  Pain is controlled with PO meds.  Treatments: surgery: see above  Discharge Exam: Blood pressure (!) 155/84, pulse 68, temperature 98 F (36.7 C), temperature source Oral, resp. rate 17, height 5\' 7"  (1.702 m), weight (!) 158.8 kg, SpO2 100 %. Abd: morbidly obese, +BS, incision with some serosang drainage, no cellulitis, JP drain in place with minimal serosang output noted.  Appropriately tender.  Disposition: Discharge disposition: 01-Home or Self Care       Discharge Instructions    Call MD for:  persistant nausea and vomiting   Complete by: As directed    Call MD for:  redness, tenderness, or signs of infection (pain, swelling, redness, odor or Sindt/yellow discharge around incision site)   Complete by: As directed    Call MD for:  severe uncontrolled pain   Complete by: As directed    Call MD for:  temperature >100.4   Complete by: As directed    Diet general    Complete by: As directed    Driving Restrictions   Complete by: As directed    Do not drive while taking pain medications   Increase activity slowly   Complete by: As directed    May shower / Bathe   Complete by: As directed      Allergies as of 08/15/2019   No Known Allergies     Medication List    TAKE these medications   albuterol 108 (90 Base) MCG/ACT inhaler Commonly known as: VENTOLIN HFA Inhale 2 puffs into the lungs every 6 (six) hours as needed. For shortness of breath   erythromycin ophthalmic ointment Place a 1/2 inch ribbon of ointment into the lower eyelid.   ibuprofen 200 MG tablet Commonly known as: ADVIL Take 600 mg by mouth every 6 (six) hours as needed for moderate pain. For pain   ketorolac 0.5 % ophthalmic solution Commonly known as: ACULAR Place 1 drop into the left eye every 6 (six) hours.   megestrol 40 MG tablet Commonly known as: MEGACE Take 1 tablet (40 mg total) by mouth 2 (two) times daily.   oxyCODONE 5 MG immediate release tablet Commonly known as: Oxy IR/ROXICODONE Take 1-2 tablets (5-10 mg total) by mouth every 4 (four) hours as needed for moderate pain.      Follow-up Information    08/17/2019, MD Follow up on 08/24/2019.   Specialty: General Surgery Why: 4:40pm, arrive by 4:10pm for check in and paperwork process.  Please bring photo ID and insurance card if  you have one  Contact information: [place] [phone]        primary care provider Follow up.   Why: You will need to follow up for elevated blood glucose levels which may be an indication of diabetes          Signed: Maia Petties 08/15/2019, 8:40 AM

## 2019-08-15 NOTE — TOC Transition Note (Signed)
Transition of Care [facility]) - CM/SW Discharge Note Donn Pierini RN, BSN Transitions of Care Unit 4E- RN Case Manager [phone] Weekend Coverage  Patient Details  Name: Victoria Chavez MRN: [id number] Date of Birth: November 28, 1980  Transition of Care [facility]) CM/SW Contact:  Darrold Span, RN Phone Number: 08/15/2019, 11:26 AM   Clinical Narrative:    Pt stable for transition home today, received call from bedside RN regarding pt's concerns about medication cost and insurance needs. CM spoke with pt at bedside- pt states she was unable to apply for insurance yet at her job and is concerned about her bill- discussed with pt that she will need to call # on bill when she gets it to speak with someone to see what assistance she might qualify for. Also discussed Primary care needs - info provided for both [facility] with phone # to call to request f/u appointment. Only script pt provided was for pain medicine which [employer] does not cover- explained this to patient- who voiced understanding- info provided for [employer]. Pt did not qualify for [facility] program.    Final next level of care: Home/Self Care Barriers to Discharge: No Barriers Identified   Patient Goals and CMS Choice Patient states their goals for this hospitalization and ongoing recovery are:: home   Choice offered to / list presented to : NA  Discharge Placement               Home        Discharge Plan and Services                DME Arranged: N/A         HH Arranged: NA          Social Determinants of Health (SDOH) Interventions     Readmission Risk Interventions Readmission Risk Prevention Plan 08/15/2019  Post Dischage Appt Complete  Medication Screening Complete  Transportation Screening Complete  Some recent data might be hidden

## 2019-08-17 LAB — CULTURE, BLOOD (ROUTINE X 2)
Culture: NO GROWTH
Culture: NO GROWTH
Special Requests: ADEQUATE
Special Requests: ADEQUATE

## 2019-10-26 ENCOUNTER — Encounter (HOSPITAL_COMMUNITY): Payer: Self-pay

## 2019-10-26 ENCOUNTER — Emergency Department (HOSPITAL_COMMUNITY): Payer: Self-pay

## 2019-10-26 ENCOUNTER — Inpatient Hospital Stay (HOSPITAL_COMMUNITY)
Admission: EM | Admit: 2019-10-26 | Discharge: 2019-10-29 | DRG: 863 | Disposition: A | Discharge status: Home / Self Care | Payer: Self-pay | Attending: Obstetrics and Gynecology | Admitting: Obstetrics and Gynecology

## 2019-10-26 DIAGNOSIS — A5901 Trichomonal vulvovaginitis: Secondary | ICD-10-CM

## 2019-10-26 DIAGNOSIS — D638 Anemia in other chronic diseases classified elsewhere: Secondary | ICD-10-CM | POA: Diagnosis present

## 2019-10-26 DIAGNOSIS — Z791 Long term (current) use of non-steroidal anti-inflammatories (NSAID): Secondary | ICD-10-CM

## 2019-10-26 DIAGNOSIS — T8149XA Infection following a procedure, other surgical site, initial encounter: Principal | ICD-10-CM | POA: Diagnosis present

## 2019-10-26 DIAGNOSIS — A599 Trichomoniasis, unspecified: Secondary | ICD-10-CM | POA: Diagnosis present

## 2019-10-26 DIAGNOSIS — K567 Ileus, unspecified: Secondary | ICD-10-CM | POA: Diagnosis not present

## 2019-10-26 DIAGNOSIS — Y839 Surgical procedure, unspecified as the cause of abnormal reaction of the patient, or of later complication, without mention of misadventure at the time of the procedure: Secondary | ICD-10-CM | POA: Diagnosis present

## 2019-10-26 DIAGNOSIS — K5900 Constipation, unspecified: Secondary | ICD-10-CM | POA: Diagnosis present

## 2019-10-26 DIAGNOSIS — A5900 Urogenital trichomoniasis, unspecified: Secondary | ICD-10-CM | POA: Diagnosis present

## 2019-10-26 DIAGNOSIS — D649 Anemia, unspecified: Secondary | ICD-10-CM | POA: Diagnosis present

## 2019-10-26 DIAGNOSIS — A549 Gonococcal infection, unspecified: Secondary | ICD-10-CM | POA: Diagnosis present

## 2019-10-26 DIAGNOSIS — Z6841 Body Mass Index (BMI) 40.0 and over, adult: Secondary | ICD-10-CM

## 2019-10-26 DIAGNOSIS — R112 Nausea with vomiting, unspecified: Secondary | ICD-10-CM

## 2019-10-26 DIAGNOSIS — Z20822 Contact with and (suspected) exposure to covid-19: Secondary | ICD-10-CM | POA: Diagnosis present

## 2019-10-26 DIAGNOSIS — N739 Female pelvic inflammatory disease, unspecified: Secondary | ICD-10-CM | POA: Diagnosis present

## 2019-10-26 DIAGNOSIS — R110 Nausea: Secondary | ICD-10-CM | POA: Diagnosis present

## 2019-10-26 DIAGNOSIS — I10 Essential (primary) hypertension: Secondary | ICD-10-CM | POA: Diagnosis present

## 2019-10-26 DIAGNOSIS — Z79899 Other long term (current) drug therapy: Secondary | ICD-10-CM

## 2019-10-26 DIAGNOSIS — K449 Diaphragmatic hernia without obstruction or gangrene: Secondary | ICD-10-CM | POA: Diagnosis present

## 2019-10-26 DIAGNOSIS — R109 Unspecified abdominal pain: Secondary | ICD-10-CM

## 2019-10-26 DIAGNOSIS — E538 Deficiency of other specified B group vitamins: Secondary | ICD-10-CM | POA: Diagnosis present

## 2019-10-26 DIAGNOSIS — K8020 Calculus of gallbladder without cholecystitis without obstruction: Secondary | ICD-10-CM | POA: Diagnosis present

## 2019-10-26 DIAGNOSIS — L0291 Cutaneous abscess, unspecified: Secondary | ICD-10-CM | POA: Diagnosis present

## 2019-10-26 DIAGNOSIS — F17210 Nicotine dependence, cigarettes, uncomplicated: Secondary | ICD-10-CM | POA: Diagnosis present

## 2019-10-26 DIAGNOSIS — D509 Iron deficiency anemia, unspecified: Secondary | ICD-10-CM | POA: Diagnosis present

## 2019-10-26 LAB — COMPREHENSIVE METABOLIC PANEL
ALT: 14 U/L (ref 0–44)
AST: 14 U/L — ABNORMAL LOW (ref 15–41)
Albumin: 2.7 g/dL — ABNORMAL LOW (ref 3.5–5.0)
Alkaline Phosphatase: 104 U/L (ref 38–126)
Anion gap: 12 (ref 5–15)
BUN: 8 mg/dL (ref 6–20)
CO2: 21 mmol/L — ABNORMAL LOW (ref 22–32)
Calcium: 8.7 mg/dL — ABNORMAL LOW (ref 8.9–10.3)
Chloride: 103 mmol/L (ref 98–111)
Creatinine, Ser: 0.72 mg/dL (ref 0.44–1.00)
GFR calc Af Amer: >60 mL/min (ref >60)
GFR calc non Af Amer: >60 mL/min (ref >60)
Glucose, Bld: 108 mg/dL — ABNORMAL HIGH (ref 70–99)
Potassium: 3.6 mmol/L (ref 3.5–5.1)
Sodium: 136 mmol/L (ref 135–145)
Total Bilirubin: 0.5 mg/dL (ref 0.3–1.2)
Total Protein: 7.6 g/dL (ref 6.5–8.1)

## 2019-10-26 LAB — URINALYSIS, ROUTINE W REFLEX MICROSCOPIC
Bilirubin Urine: NEGATIVE
Glucose, UA: NEGATIVE mg/dL
Hgb urine dipstick: NEGATIVE
Ketones, ur: NEGATIVE mg/dL
Leukocytes,Ua: NEGATIVE
Nitrite: NEGATIVE
Protein, ur: NEGATIVE mg/dL
Specific Gravity, Urine: 1.016 (ref 1.005–1.030)
pH: 7 (ref 5.0–8.0)

## 2019-10-26 LAB — I-STAT BETA HCG BLOOD, ED (MC, WL, AP ONLY): I-stat hCG, quantitative: <5 m[IU]/mL (ref <5)

## 2019-10-26 LAB — WET PREP, GENITAL
Clue Cells Wet Prep HPF POC: NONE SEEN
Sperm: NONE SEEN
Yeast Wet Prep HPF POC: NONE SEEN

## 2019-10-26 LAB — HIV ANTIBODY (ROUTINE TESTING W REFLEX): HIV Screen 4th Generation wRfx: NONREACTIVE

## 2019-10-26 LAB — CBC
HCT: 27.7 % — ABNORMAL LOW (ref 36.0–46.0)
Hemoglobin: 7.8 g/dL — ABNORMAL LOW (ref 12.0–15.0)
MCH: 18 pg — ABNORMAL LOW (ref 26.0–34.0)
MCHC: 28.2 g/dL — ABNORMAL LOW (ref 30.0–36.0)
MCV: 63.8 fL — ABNORMAL LOW (ref 80.0–100.0)
Platelets: 694 10*3/uL — ABNORMAL HIGH (ref 150–400)
RBC: 4.34 MIL/uL (ref 3.87–5.11)
RDW: 19.3 % — ABNORMAL HIGH (ref 11.5–15.5)
WBC: 13.1 10*3/uL — ABNORMAL HIGH (ref 4.0–10.5)
nRBC: 0 % (ref 0.0–0.2)

## 2019-10-26 LAB — LIPASE, BLOOD: Lipase: 15 U/L (ref 11–51)

## 2019-10-26 LAB — SARS CORONAVIRUS 2 BY RT PCR (HOSPITAL ORDER, PERFORMED IN ~~LOC~~ HOSPITAL LAB): SARS Coronavirus 2: NEGATIVE

## 2019-10-26 LAB — POC OCCULT BLOOD, ED: Fecal Occult Bld: NEGATIVE

## 2019-10-26 MED ORDER — SIMETHICONE 80 MG PO CHEW: 80.0000 mg | CHEWABLE_TABLET | Freq: Four times a day (QID) | ORAL | Status: DC | PRN

## 2019-10-26 MED ORDER — METRONIDAZOLE 500 MG PO TABS
2000.0000 mg | ORAL_TABLET | Freq: Once | ORAL | Status: AC
  Administered 2019-10-26: 2000 mg via ORAL
  Filled 2019-10-26: qty 4

## 2019-10-26 MED ORDER — OXYCODONE HCL 5 MG PO TABS
5.0000 mg | ORAL_TABLET | ORAL | Status: DC | PRN
  Administered 2019-10-26 – 2019-10-29 (×9): 10 mg via ORAL
  Filled 2019-10-26 (×4): qty 2
  Filled 2019-10-26: qty 1
  Filled 2019-10-26 (×2): qty 2
  Filled 2019-10-26: qty 1
  Filled 2019-10-26 (×2): qty 2

## 2019-10-26 MED ORDER — SODIUM CHLORIDE 0.9% FLUSH
3.0000 mL | Freq: Once | INTRAVENOUS | Status: AC
  Administered 2019-10-26: 3 mL via INTRAVENOUS

## 2019-10-26 MED ORDER — AMLODIPINE BESYLATE 5 MG PO TABS
5.0000 mg | ORAL_TABLET | Freq: Every day | ORAL | Status: DC
  Administered 2019-10-26 – 2019-10-29 (×4): 5 mg via ORAL
  Filled 2019-10-26 (×4): qty 1

## 2019-10-26 MED ORDER — MORPHINE SULFATE (PF) 4 MG/ML IV SOLN
4.0000 mg | Freq: Once | INTRAVENOUS | Status: DC
  Filled 2019-10-26: qty 1

## 2019-10-26 MED ORDER — FERROUS GLUCONATE 324 (38 FE) MG PO TABS
324.0000 mg | ORAL_TABLET | Freq: Every day | ORAL | Status: DC
  Filled 2019-10-26 (×2): qty 1

## 2019-10-26 MED ORDER — IBUPROFEN 600 MG PO TABS: 600.0000 mg | ORAL_TABLET | Freq: Four times a day (QID) | ORAL | Status: DC | PRN

## 2019-10-26 MED ORDER — ONDANSETRON HCL 4 MG PO TABS: 4.0000 mg | ORAL_TABLET | Freq: Four times a day (QID) | ORAL | Status: DC | PRN

## 2019-10-26 MED ORDER — PIPERACILLIN-TAZOBACTAM 3.375 G IVPB 30 MIN
3.3750 g | Freq: Once | INTRAVENOUS | Status: DC
  Filled 2019-10-26: qty 50

## 2019-10-26 MED ORDER — ALUM & MAG HYDROXIDE-SIMETH 200-200-20 MG/5ML PO SUSP: 30.0000 mL | ORAL | Status: DC | PRN

## 2019-10-26 MED ORDER — CHLORHEXIDINE GLUCONATE CLOTH 2 % EX PADS
6.0000 | MEDICATED_PAD | Freq: Every day | CUTANEOUS | Status: DC
  Administered 2019-10-27: 6 via TOPICAL

## 2019-10-26 MED ORDER — SODIUM CHLORIDE 0.9% FLUSH: 10.0000 mL | INTRAVENOUS | Status: DC | PRN

## 2019-10-26 MED ORDER — ENOXAPARIN SODIUM 80 MG/0.8ML ~~LOC~~ SOLN
75.0000 mg | SUBCUTANEOUS | Status: DC
  Administered 2019-10-26: 75 mg via SUBCUTANEOUS
  Filled 2019-10-26: qty 0.75

## 2019-10-26 MED ORDER — IOHEXOL 300 MG/ML  SOLN
100.0000 mL | Freq: Once | INTRAMUSCULAR | Status: AC | PRN
  Administered 2019-10-26: 100 mL via INTRAVENOUS

## 2019-10-26 MED ORDER — HYDROMORPHONE HCL 1 MG/ML IJ SOLN
1.0000 mg | INTRAMUSCULAR | Status: AC | PRN
  Administered 2019-10-26 (×2): 1 mg via INTRAVENOUS
  Filled 2019-10-26 (×2): qty 1

## 2019-10-26 MED ORDER — PIPERACILLIN-TAZOBACTAM 3.375 G IVPB
3.3750 g | Freq: Three times a day (TID) | INTRAVENOUS | Status: DC
  Administered 2019-10-27 – 2019-10-28 (×2): 3.375 g via INTRAVENOUS
  Filled 2019-10-26 (×2): qty 50

## 2019-10-26 MED ORDER — POLYETHYLENE GLYCOL 3350 17 G PO PACK
17.0000 g | PACK | Freq: Every day | ORAL | Status: DC
  Administered 2019-10-27 – 2019-10-29 (×3): 17 g via ORAL
  Filled 2019-10-26 (×3): qty 1

## 2019-10-26 MED ORDER — MORPHINE SULFATE (PF) 4 MG/ML IV SOLN
4.0000 mg | Freq: Once | INTRAVENOUS | Status: AC
  Administered 2019-10-26: 4 mg via INTRAVENOUS
  Filled 2019-10-26: qty 1

## 2019-10-26 MED ORDER — SODIUM CHLORIDE 0.9 % IV BOLUS
1000.0000 mL | Freq: Once | INTRAVENOUS | Status: AC
  Administered 2019-10-26: 1000 mL via INTRAVENOUS

## 2019-10-26 MED ORDER — PIPERACILLIN-TAZOBACTAM 3.375 G IVPB 30 MIN
3.3750 g | Freq: Once | INTRAVENOUS | Status: AC
  Administered 2019-10-26: 3.375 g via INTRAVENOUS

## 2019-10-26 MED ORDER — ACETAMINOPHEN 500 MG PO TABS: 1000.0000 mg | ORAL_TABLET | Freq: Three times a day (TID) | ORAL | Status: DC | PRN

## 2019-10-26 MED ORDER — LACTATED RINGERS IV SOLN: INTRAVENOUS | Status: AC

## 2019-10-26 MED ORDER — ONDANSETRON HCL 4 MG/2ML IJ SOLN
4.0000 mg | Freq: Four times a day (QID) | INTRAMUSCULAR | Status: DC | PRN
  Administered 2019-10-27 – 2019-10-29 (×2): 4 mg via INTRAVENOUS
  Filled 2019-10-26 (×2): qty 2

## 2019-10-26 NOTE — ED Provider Notes (Signed)
[facility] EMERGENCY DEPARTMENT Provider Note   CSN: [id number] Arrival date & time: 10/26/19  7096     History Chief Complaint  Patient presents with  . Constipation    Victoria Chavez is a 39 y.o. female.  The history is provided by the patient and medical records. No language interpreter was used.  Constipation    39 year old female with history of history of incarcerated umbilical hernia status post incisional hernia repair presenting complaining of constipation.  Patient report for the past 3 to 4 days she has had progressive worsening abdominal pain with constipation.  States that she produces small bowel movement but able to pass flatus.  She report pain is 20 out of 10, sharp and achy to her lower abdomen.  She does not complain of chest pain or shortness of breath no nausea or vomiting.  She noticed that her urine is darker than usual.  She denies any abnormal bleeding no blood per rectum no bleeding gums.  She recently came off her menstruation states it was fairly normal.  Patient report having abdominal surgery for incarcerated hernia 2 months prior and then also had to come back to the hospital due to infection at the site required drainage.  Pain is worse to palpation or with movement.  Past Medical History:  Diagnosis Date  . Abdominal hernia   . Morbid obesity [facility])     Patient Active Problem List   Diagnosis Date Noted  . Incarcerated umbilical hernia 08/12/2019  . Morbid obesity (HCC) 02/18/2017  . Umbilical hernia 02/18/2017  . Tobacco user 02/18/2017    Past Surgical History:  Procedure Laterality Date  . HERNIA REPAIR    . INCISIONAL HERNIA REPAIR N/A 08/12/2019   Procedure: Recurrent incisional hernia repair;  Surgeon: Axel Filler, MD;  Location: [facility] OR;  Service: General;  Laterality: N/A;  . LAPAROTOMY N/A 08/12/2019   Procedure: EXPLORATORY LAPAROTOMY;  Surgeon: Axel Filler, MD;  Location: [facility] OR;  Service: General;  Laterality:  N/A;  . OMENTECTOMY N/A 08/12/2019   Procedure: Partial Omentectomy;  Surgeon: Axel Filler, MD;  Location: [facility] OR;  Service: General;  Laterality: N/A;     OB History    Gravida  0   Para  0   Term  0   Preterm  0   AB  0   Living  0     SAB  0   TAB  0   Ectopic  0   Multiple  0   Live Births  0           No family history on file.  Social History   Tobacco Use  . Smoking status: Current Every Day Smoker    Packs/day: 0.50    Types: Cigarettes  . Smokeless tobacco: Never Used  Substance Use Topics  . Alcohol use: Yes  . Drug use: Yes    Frequency: 14.0 times per week    Types: Marijuana    Home Medications Prior to Admission medications   Medication Sig Start Date End Date Taking? Authorizing Provider  albuterol (PROVENTIL HFA;VENTOLIN HFA) 108 (90 BASE) MCG/ACT inhaler Inhale 2 puffs into the lungs every 6 (six) hours as needed. For shortness of breath    [provider]  erythromycin ophthalmic ointment Place a 1/2 inch ribbon of ointment into the lower eyelid. Patient not taking: Reported on 08/12/2019 05/17/19   Lorelee New, PA-C  ibuprofen (ADVIL,MOTRIN) 200 MG tablet Take 600 mg by mouth every 6 (  six) hours as needed for moderate pain. For pain     [provider]  ketorolac (ACULAR) 0.5 % ophthalmic solution Place 1 drop into the left eye every 6 (six) hours. Patient not taking: Reported on 08/12/2019 05/17/19   Lorelee New, PA-C  megestrol (MEGACE) 40 MG tablet Take 1 tablet (40 mg total) by mouth 2 (two) times daily. Patient not taking: Reported on 08/12/2019 02/18/17   Donette Larry, CNM  oxyCODONE (OXY IR/ROXICODONE) 5 MG immediate release tablet Take 1-2 tablets (5-10 mg total) by mouth every 4 (four) hours as needed for moderate pain. 08/15/19   Manus Rudd, MD    Allergies    Patient has no known allergies.  Review of Systems   Review of Systems  Gastrointestinal: Positive for constipation.  All other  systems reviewed and are negative.   Physical Exam Updated Vital Signs BP 140/76 (BP Location: Right Arm)   Pulse 87   Temp 98.7 F (37.1 C) (Oral)   Resp 15   SpO2 99%   Physical Exam Vitals and nursing note reviewed. Exam conducted with a chaperone present.  Constitutional:      General: She is not in acute distress.    Appearance: She is well-developed. She is obese.  HENT:     Head: Atraumatic.  Eyes:     Conjunctiva/sclera: Conjunctivae normal.  Cardiovascular:     Rate and Rhythm: Normal rate and regular rhythm.     Pulses: Normal pulses.     Heart sounds: Normal heart sounds.  Pulmonary:     Effort: Pulmonary effort is normal.     Breath sounds: Normal breath sounds. No wheezing, rhonchi or rales.  Abdominal:     Palpations: Abdomen is soft.     Tenderness: There is abdominal tenderness (Diffuse abdominal tenderness more significant to suprapubic and umbilical region without overlying skin changes.  Decreased bowel sounds.).  Genitourinary:    Comments: Chaperone present during exam.  Normal rectal tone, no obvious mass, no stool impaction, normal color stool on glove. Musculoskeletal:     Cervical back: Neck supple.  Skin:    Findings: No rash.  Neurological:     Mental Status: She is alert and oriented to person, place, and time.  Psychiatric:        Mood and Affect: Mood normal.     ED Results / Procedures / Treatments   Labs (all labs ordered are listed, but only abnormal results are displayed) Labs Reviewed  WET PREP, GENITAL - Abnormal; Notable for the following components:      Result Value   Trich, Wet Prep PRESENT (*)    WBC, Wet Prep HPF POC MODERATE (*)    All other components within normal limits  COMPREHENSIVE METABOLIC PANEL - Abnormal; Notable for the following components:   CO2 21 (*)    Glucose, Bld 108 (*)    Calcium 8.7 (*)    Albumin 2.7 (*)    AST 14 (*)    All other components within normal limits  CBC - Abnormal; Notable for  the following components:   WBC 13.1 (*)    Hemoglobin 7.8 (*)    HCT 27.7 (*)    MCV 63.8 (*)    MCH 18.0 (*)    MCHC 28.2 (*)    RDW 19.3 (*)    Platelets 694 (*)    All other components within normal limits  SARS CORONAVIRUS 2 BY RT PCR (HOSPITAL ORDER, PERFORMED IN [facility])  LIPASE, BLOOD  URINALYSIS, ROUTINE W REFLEX MICROSCOPIC  RPR  HIV ANTIBODY (ROUTINE TESTING W REFLEX)  I-STAT BETA HCG BLOOD, ED ([facility], [facility], [facility] ONLY)  POC OCCULT BLOOD, ED  GC/CHLAMYDIA PROBE AMP ([place]) NOT AT [facility]    EKG None  Radiology CT ABDOMEN PELVIS W CONTRAST  Result Date: 10/26/2019 CLINICAL DATA:  Constipation. Abdominal surgery on 08/12/2019 for repair of incisional hernia. EXAM: CT ABDOMEN AND PELVIS WITH CONTRAST TECHNIQUE: Multidetector CT imaging of the abdomen and pelvis was performed using the standard protocol following bolus administration of intravenous contrast. CONTRAST:  OMNIPAQUE IOHEXOL 300 MG/ML  SOLN COMPARISON:  CT scan dated 08/12/2019 FINDINGS: Lower chest: No significant abnormalities. Hepatobiliary: Hepatic parenchyma is normal. Multiple gallstones. No thickening of the gallbladder wall. No gallbladder distention. No bile duct dilatation. Pancreas: Unremarkable. No pancreatic ductal dilatation or surrounding inflammatory changes. Spleen: Normal in size without focal abnormality. Adrenals/Urinary Tract: Multiple small chronic stones in the mid left kidney. 16 mm benign-appearing exophytic cyst in the mid left kidney. Right kidney appears normal. Adrenal glands are normal. No hydronephrosis. Delayed images demonstrate a normal appearing bladder. Stomach/Bowel: Small hiatal hernia. The stomach is otherwise normal. Interval repair of the ventral hernia. No dilated large or small bowel. The appendix is not discretely identified. Vascular/Lymphatic: No significant vascular findings are present. No enlarged abdominal or pelvic lymph nodes. Reproductive: There are  multiple new septated fluid collections in the pelvis surrounding the uterus and ovaries, likely representing a complex pelvic abscess. This obscures the detail of the ovaries and uterus. Other: Small amount of fluid and air along the surgical incision for the ventral hernia repair. This could represent a small abscess, best seen on image 79 of series 3. No free air in the abdomen. Musculoskeletal: No acute or significant osseous findings. IMPRESSION: 1. Multiple new septated fluid collections in the pelvis surrounding the uterus and ovaries, likely representing a complex pelvic abscess. 2. Small amount of fluid and air along the surgical incision for the ventral hernia repair. This could represent a small abscess. 3. Interval repair of the ventral hernia. 4. Chronic cholelithiasis. 5. Multiple small chronic stones in the mid left kidney. Electronically Signed   By: Francene Boyers M.D.   On: 10/26/2019 14:48    Procedures Procedures (including critical care time)  Medications Ordered in ED Medications  piperacillin-tazobactam (ZOSYN) IVPB 3.375 g (has no administration in time range)  morphine 4 MG/ML injection 4 mg (has no administration in time range)  sodium chloride flush (NS) 0.9 % injection 3 mL (3 mLs Intravenous Given 10/26/19 1225)  morphine 4 MG/ML injection 4 mg (4 mg Intravenous Given 10/26/19 1229)  sodium chloride 0.9 % bolus 1,000 mL (1,000 mLs Intravenous Bolus from Bag 10/26/19 1224)  iohexol (OMNIPAQUE) 300 MG/ML solution 100 mL (100 mLs Intravenous Contrast Given 10/26/19 1316)    ED Course  I have reviewed the triage vital signs and the nursing notes.  Pertinent labs & imaging results that were available during my care of the patient were reviewed by me and considered in my medical decision making (see chart for details).    MDM Rules/Calculators/A&P                          BP 140/76 (BP Location: Right Arm)   Pulse 87   Temp 98.7 F (37.1 C) (Oral)   Resp 15   SpO2 99%    Final Clinical Impression(s) / ED Diagnoses Final  diagnoses:  Pelvic abscess in female  Postoperative abscess    Rx / DC Orders ED Discharge Orders    None     11:30 AM Patient with previous history of incarcerated medical hernia require surgical repair 2 months prior presenting here with complaints of abdominal pain and constipation for the past 3 to 4 days.  She has a diffuse abdominal tenderness on exam.  No evidence of stool impaction on digital rectal exam.  Will obtain abdominal pelvic CT scan to rule out SBO or other acute emergent abdominal pathology.  Pain medication given.  Work-up initiated.  3:08 PM Labs remarkable for an elevated white count of 13.1.  Her hemoglobin is 7.8 which is steadily trending down from a few months ago.  A fecal occult blood test was obtained showing no evidence of GI bleed.  Urine shows no signs of urinary tract infection.  Pregnancy test is negative.  CT scan of the abdomen pelvis demonstrate multiple new septated fluid collections in the pelvis surrounding the uterus and ovaries, likely representing a complex pelvic abscess.  There is a small amount of fluid and air along the surgical incision for the ventral hernia repair which could represent a small abscess.  I have consulted on-call gynecologist Dr. Vergie LivingPickens, who recommend pelvic semination, as well as consult general surgery for evaluation, if general surgery felt that the finding is not related to the recent surgery in April and then Dr. Rubin PayorPickering is agreeable in admitting the patient.  I will initiate zosyn antibiotic.  Patient is currently afebrile, vital signs stable.  Care discussed with Dr. Lockie Molauratolo.    3:42 PM General Surgery agrees to see pt for umbilical surgical wound, but felt pelvic abscess is not likely related to surgery.  I have consulted GYN Dr. Vergie LivingPickens who agrees to see and admit pt.  COVID-19 screening test ordered.  Pain medication given.   Sheppard Pentonminta Michael was evaluated in  Emergency Department on 10/26/2019 for the symptoms described in the history of present illness. She was evaluated in the context of the global COVID-19 pandemic, which necessitated consideration that the patient might be at risk for infection with the SARS-CoV-2 virus that causes COVID-19. Institutional protocols and algorithms that pertain to the evaluation of patients at risk for COVID-19 are in a state of rapid change based on information released by regulatory bodies including the CDC and federal and state organizations. These policies and algorithms were followed during the patient's care in the ED.    Fayrene Helperran, Daysie Helf, PA-C 10/26/19 1545    Curatolo, Adam, DO 10/26/19 [phone]

## 2019-10-26 NOTE — ED Notes (Signed)
Patient transported to CT 

## 2019-10-26 NOTE — Progress Notes (Signed)
  Asked to see the patient at the request of ED provider Fayrene Helper, PA-C  Patient is s/p ex lap, omental resection, primary ventral hernia repair 08/12/19 complicated by wound infection soon after surgery treated with I&D. She presents today with a cc 4 days of lower abdominal pain and constipation. States until 4 days ago she was tolerating PO and having normal bowel function. CT abdomen pelvis reveals pelvic abscess and a question of a small subcutaneous abscess near her umbilicus.   On exam the patient has mild periumbilical tenderness with cellulitis, induration, or obvious fluctuance.   Labs reveal the patient has trichomonas. It would be very rare for a patient to have pelvic abscess 10 weeks out from abdominal surgery. Her symptoms are more consistent with PID. No acute general surgery needs. Call as needed if you have questions or concerns.   Recommend GYN consult/admission for treatment of trichomonas and pelvic abscesses.   Hosie Spangle, PA-C

## 2019-10-26 NOTE — H&P (Addendum)
Gynecology H&P   Date of Admission: 10/26/2019   Requesting Provider: [facility]  Primary OBGYN: None Primary Care Provider: None  Reason for Admission: pelvic abscess  History of Present Illness: Victoria Chavez is a 39 y.o. G0 (10/12/19), with the above CC. PMHx is significant for h/o April 2021 umbilical hernia repair for incarcerated hernia, BMI 50s, HTN.  Abdominal pain that she says feels like her s/s in April that led her to surgery started a few days prior. No GYN s/s. Pt taking PO okay, no fevers, chills.   CT showed pelvic abscesses and +trich on wet prep. Gen surg to formally consult but they don't feel it's a Gen Surgery primary in etiology  ROS: A 12-point review of systems was performed and negative, except as stated in the above HPI.  OBGYN History: As per HPI. OB History  Gravida Para Term Preterm AB Living  0 0 0 0 0 0  SAB TAB Ectopic Multiple Live Births  0 0 0 0 0    Periods: qmonth, regular, 1 week History of pap smears: unknown History of STIs: in the remote past She is currently using nothing for contraception.    Past Medical History: Past Medical History:  Diagnosis Date  . Abdominal hernia   . Morbid obesity (HCC)     Past Surgical History: Past Surgical History:  Procedure Laterality Date  . HERNIA REPAIR    . INCISIONAL HERNIA REPAIR N/A 08/12/2019   Procedure: Recurrent incisional hernia repair;  Surgeon: Axel Filler, MD;  Location: [facility] OR;  Service: General;  Laterality: N/A;  . LAPAROTOMY N/A 08/12/2019   Procedure: EXPLORATORY LAPAROTOMY;  Surgeon: Axel Filler, MD;  Location: [facility] OR;  Service: General;  Laterality: N/A;  . OMENTECTOMY N/A 08/12/2019   Procedure: Partial Omentectomy;  Surgeon: Axel Filler, MD;  Location: [facility] OR;  Service: General;  Laterality: N/A;    Family History:  No family history on file.  Social History:  Social History   Socioeconomic History  . Marital status: Single    Spouse name: Not on file  .  Number of children: Not on file  . Years of education: Not on file  . Highest education level: Not on file  Occupational History  . Not on file  Tobacco Use  . Smoking status: Current Every Day Smoker    Packs/day: 0.50    Types: Cigarettes  . Smokeless tobacco: Never Used  Substance and Sexual Activity  . Alcohol use: Yes  . Drug use: Yes    Frequency: 14.0 times per week    Types: Marijuana  . Sexual activity: Yes    Birth control/protection: None  Other Topics Concern  . Not on file  Social History Narrative  . Not on file   Social Determinants of Health   Financial Resource Strain:   . Difficulty of Paying Living Expenses:   Food Insecurity:   . Worried About Programme researcher, broadcasting/film/video in the Last Year:   . Barista in the Last Year:   Transportation Needs:   . Freight forwarder (Medical):   Marland Kitchen Lack of Transportation (Non-Medical):   Physical Activity:   . Days of Exercise per Week:   . Minutes of Exercise per Session:   Stress:   . Feeling of Stress :   Social Connections:   . Frequency of Communication with Friends and Family:   . Frequency of Social Gatherings with Friends and Family:   . Attends Religious Services:   .  Active Member of Clubs or Organizations:   . Attends Banker Meetings:   Marland Kitchen Marital Status:   Intimate Partner Violence:   . Fear of Current or Ex-Partner:   . Emotionally Abused:   Marland Kitchen Physically Abused:   . Sexually Abused:    Allergy: No Known Allergies  Current Outpatient Medications: None   Hospital Medications: Current Facility-Administered Medications  Medication Dose Route Frequency Provider Last Rate Last Admin  . acetaminophen (TYLENOL) tablet 1,000 mg  1,000 mg Oral Q8H PRN [place] Bing, MD      . alum & mag hydroxide-simeth (MAALOX/MYLANTA) 200-200-20 MG/5ML suspension 30 mL  30 mL Oral Q4H PRN [place] Bing, MD      . enoxaparin (LOVENOX) injection 75 mg  75 mg Subcutaneous Q24H Joaquim Lai, [facility]       . HYDROmorphone (DILAUDID) injection 1 mg  1 mg Intravenous Q2H PRN [place] Bing, MD      . ibuprofen (ADVIL) tablet 600 mg  600 mg Oral Q6H PRN [place] Bing, MD      . lactated ringers infusion   Intravenous Continuous [place] Bing, MD      . metroNIDAZOLE (FLAGYL) tablet 2,000 mg  2,000 mg Oral Once [place] Bing, MD      . ondansetron [facility]) tablet 4 mg  4 mg Oral Q6H PRN [place] Bing, MD       Or  . ondansetron (ZOFRAN) injection 4 mg  4 mg Intravenous Q6H PRN [place] Bing, MD      . oxyCODONE (Oxy IR/ROXICODONE) immediate release tablet 5-10 mg  5-10 mg Oral Q4H PRN [place] Bing, MD      . piperacillin-tazobactam (ZOSYN) IVPB 3.375 g  3.375 g Intravenous Once Joaquim Lai, RPH 100 mL/hr at 10/26/19 1606 3.375 g at 10/26/19 1606   Followed by  . [START ON 10/27/2019] piperacillin-tazobactam (ZOSYN) IVPB 3.375 g  3.375 g Intravenous Q8H Joaquim Lai, RPH      . [START ON 10/27/2019] polyethylene glycol (MIRALAX / GLYCOLAX) packet 17 g  17 g Oral Daily [place] Bing, MD      . simethicone (MYLICON) chewable tablet 80 mg  80 mg Oral QID PRN [place] Bing, MD        Physical Exam:  Current Vital Signs 24h Vital Sign Ranges  T 98.7 F (37.1 C) Temp  Avg: 98.4 F (36.9 C)  Min: 98.1 F (36.7 C)  Max: 98.7 F (37.1 C)  BP (!) 167/85 BP  Min: 140/76  Max: 167/85  HR 94 Pulse  Avg: 90.7  Min: 87  Max: 94  RR 15 Resp  Avg: 16.5  Min: 15  Max: 18  SaO2 97 % Room Air SpO2  Avg: 98.7 %  Min: 97 %  Max: 100 %       24 Hour I/O Current Shift I/O  Time Ins Outs No intake/output data recorded. No intake/output data recorded.    There is no height or weight on file to calculate BMI. General appearance: Well nourished, well developed female in no acute distress.  Cardiovascular: S1, S2 normal, no murmur, rub or gallop, regular rate and rhythm Respiratory:  Clear to auscultation bilateral. Normal respiratory effort Abdomen: obese, minimally ttp diffusely,  soft, well healed umbilical surgical area Neuro/Psych:  Normal mood and affect.  Skin:  Warm and dry.  Extremities: no clubbing, cyanosis, or edema.    Pelvic exam:  Per ED negative  Laboratory: Positive: trich on wet prep Negative: beta hcg, lipase, FOBT Pending: COVID, GC/CT  Recent Labs  Lab 10/26/19 0704  WBC 13.1*  HGB 7.8*  HCT 27.7*  PLT 694*   Recent Labs  Lab 10/26/19 0704  NA 136  K 3.6  CL 103  CO2 21*  BUN 8  CREATININE 0.72  CALCIUM 8.7*  PROT 7.6  BILITOT 0.5  ALKPHOS 104  ALT 14  AST 14*  GLUCOSE 108*    Imaging:  Narrative & Impression  CLINICAL DATA:  Constipation. Abdominal surgery on 08/12/2019 for repair of incisional hernia.  EXAM: CT ABDOMEN AND PELVIS WITH CONTRAST  TECHNIQUE: Multidetector CT imaging of the abdomen and pelvis was performed using the standard protocol following bolus administration of intravenous contrast.  CONTRAST:  OMNIPAQUE IOHEXOL 300 MG/ML  SOLN  COMPARISON:  CT scan dated 08/12/2019  FINDINGS: Lower chest: No significant abnormalities.  Hepatobiliary: Hepatic parenchyma is normal. Multiple gallstones. No thickening of the gallbladder wall. No gallbladder distention. No bile duct dilatation.  Pancreas: Unremarkable. No pancreatic ductal dilatation or surrounding inflammatory changes.  Spleen: Normal in size without focal abnormality.  Adrenals/Urinary Tract: Multiple small chronic stones in the mid left kidney. 16 mm benign-appearing exophytic cyst in the mid left kidney. Right kidney appears normal. Adrenal glands are normal. No hydronephrosis. Delayed images demonstrate a normal appearing bladder.  Stomach/Bowel: Small hiatal hernia. The stomach is otherwise normal. Interval repair of the ventral hernia. No dilated large or small bowel. The appendix is not discretely identified.  Vascular/Lymphatic: No significant vascular findings are present. No enlarged abdominal or pelvic  lymph nodes.  Reproductive: There are multiple new septated fluid collections in the pelvis surrounding the uterus and ovaries, likely representing a complex pelvic abscess. This obscures the detail of the ovaries and uterus.  Other: Small amount of fluid and air along the surgical incision for the ventral hernia repair. This could represent a small abscess, best seen on image 79 of series 3.  No free air in the abdomen.  Musculoskeletal: No acute or significant osseous findings.  IMPRESSION: 1. Multiple new septated fluid collections in the pelvis surrounding the uterus and ovaries, likely representing a complex pelvic abscess. 2. Small amount of fluid and air along the surgical incision for the ventral hernia repair. This could represent a small abscess. 3. Interval repair of the ventral hernia. 4. Chronic cholelithiasis. 5. Multiple small chronic stones in the mid left kidney.   Electronically Signed   By: Francene Boyers M.D.   On: 10/26/2019 14:48     Assessment: Victoria Chavez is a 39 y.o. G0 with multiple pelvic abscess, +trich; pt is stable  Plan: *GYN: admit. Continue zosyn and flagyl 2gm for trich. Follow up Gen Surg final consult. Follow up swabs *HTN: start norvasc 5 qday. No s/s.  *Anemia: no s/s, likely chronic. anema panel ordered. Start qday iron. FOBT neg. Pt near her baseline *FEN/GI:  Regular diet, MIVF *PPx:  Lovenox, oob ad lib *Pain: PO PRNs *Dispo: likely HD#3-4  Total time taking care of the patient was 35 minutes, with greater than 50% of the time spent in face to face interaction with the patient.  Cornelia Copa MD Attending Center for [facility])

## 2019-10-26 NOTE — ED Triage Notes (Signed)
Pt reports that she had a hernia repair in April and since has been having constipation, last BM two days ago, denies n/v

## 2019-10-27 ENCOUNTER — Inpatient Hospital Stay (HOSPITAL_COMMUNITY): Payer: Self-pay

## 2019-10-27 DIAGNOSIS — K567 Ileus, unspecified: Secondary | ICD-10-CM | POA: Diagnosis not present

## 2019-10-27 LAB — CBC WITH DIFFERENTIAL/PLATELET
Abs Immature Granulocytes: 0.05 10*3/uL (ref 0.00–0.07)
Basophils Absolute: 0 10*3/uL (ref 0.0–0.1)
Basophils Relative: 0 %
Eosinophils Absolute: 0.1 10*3/uL (ref 0.0–0.5)
Eosinophils Relative: 1 %
HCT: 25.4 % — ABNORMAL LOW (ref 36.0–46.0)
Hemoglobin: 7.1 g/dL — ABNORMAL LOW (ref 12.0–15.0)
Immature Granulocytes: 1 %
Lymphocytes Relative: 15 %
Lymphs Abs: 1.4 10*3/uL (ref 0.7–4.0)
MCH: 18.1 pg — ABNORMAL LOW (ref 26.0–34.0)
MCHC: 28 g/dL — ABNORMAL LOW (ref 30.0–36.0)
MCV: 64.6 fL — ABNORMAL LOW (ref 80.0–100.0)
Monocytes Absolute: 1.7 10*3/uL — ABNORMAL HIGH (ref 0.1–1.0)
Monocytes Relative: 18 %
Neutro Abs: 6.4 10*3/uL (ref 1.7–7.7)
Neutrophils Relative %: 65 %
Platelets: 592 10*3/uL — ABNORMAL HIGH (ref 150–400)
RBC: 3.93 MIL/uL (ref 3.87–5.11)
RDW: 19.4 % — ABNORMAL HIGH (ref 11.5–15.5)
WBC: 9.7 10*3/uL (ref 4.0–10.5)
nRBC: 0 % (ref 0.0–0.2)

## 2019-10-27 LAB — RETICULOCYTES
Immature Retic Fract: 14.2 % (ref 2.3–15.9)
RBC.: 3.92 MIL/uL (ref 3.87–5.11)
Retic Count, Absolute: 67 10*3/uL (ref 19.0–186.0)
Retic Ct Pct: 1.7 % (ref 0.4–3.1)

## 2019-10-27 LAB — IRON AND TIBC
Iron: 8 ug/dL — ABNORMAL LOW (ref 28–170)
Saturation Ratios: 3 % — ABNORMAL LOW (ref 10.4–31.8)
TIBC: 256 ug/dL (ref 250–450)
UIBC: 248 ug/dL

## 2019-10-27 LAB — GC/CHLAMYDIA PROBE AMP (~~LOC~~) NOT AT ARMC
Chlamydia: NEGATIVE
Comment: NEGATIVE
Comment: NORMAL
Neisseria Gonorrhea: POSITIVE — AB

## 2019-10-27 LAB — FOLATE: Folate: 11.9 ng/mL (ref >5.9)

## 2019-10-27 LAB — FERRITIN: Ferritin: 99 ng/mL (ref 11–307)

## 2019-10-27 LAB — RPR: RPR Ser Ql: NONREACTIVE

## 2019-10-27 LAB — VITAMIN B12: Vitamin B-12: 118 pg/mL — ABNORMAL LOW (ref 180–914)

## 2019-10-27 MED ORDER — PROCHLORPERAZINE EDISYLATE 10 MG/2ML IJ SOLN
10.0000 mg | Freq: Four times a day (QID) | INTRAMUSCULAR | Status: DC | PRN
  Administered 2019-10-27: 10 mg via INTRAVENOUS
  Filled 2019-10-27: qty 2

## 2019-10-27 MED ORDER — VITAMIN B-12 1000 MCG PO TABS
1000.0000 ug | ORAL_TABLET | Freq: Every day | ORAL | Status: DC
  Administered 2019-10-27 – 2019-10-29 (×3): 1000 ug via ORAL
  Filled 2019-10-27 (×3): qty 1

## 2019-10-27 MED ORDER — LACTATED RINGERS IV SOLN: INTRAVENOUS | Status: DC

## 2019-10-27 MED ORDER — SODIUM CHLORIDE 0.9 % IV SOLN
125.0000 mg | Freq: Once | INTRAVENOUS | Status: AC
  Administered 2019-10-27: 125 mg via INTRAVENOUS
  Filled 2019-10-27: qty 10

## 2019-10-27 MED ORDER — ENOXAPARIN SODIUM 80 MG/0.8ML ~~LOC~~ SOLN
70.0000 mg | SUBCUTANEOUS | Status: DC
  Administered 2019-10-27 – 2019-10-28 (×2): 70 mg via SUBCUTANEOUS
  Filled 2019-10-27 (×2): qty 0.8

## 2019-10-27 NOTE — Progress Notes (Addendum)
Gynecology Progress Note  Admission Date: 10/26/2019 Current Date: 10/27/2019 9:42 AM  Victoria Chavez is a 39 y.o. G0 HD#2 admitted for abdominal pain, multiple pelvic abscesses seen on CT, nausea. PMhx significant for h/o April 2021 umbilical hernia repair for incarceration, BMI 40s, HTN, +trich on admission   History complicated by: Patient Active Problem List   Diagnosis Date Noted  . Pelvic abscess in female 10/26/2019  . Trichimoniasis 10/26/2019  . Anemia 10/26/2019  . Incarcerated umbilical hernia 08/12/2019  . Morbid obesity (HCC) 02/18/2017  . Umbilical hernia 02/18/2017  . Tobacco user 02/18/2017    ROS and patient/family/surgical history, located on admission H&P note dated 10/26/2019, have been reviewed, and there are no changes except as noted below Yesterday/Overnight Events:  none  Subjective:  Stable pain and nausea w/o vomiting. Not really an appetite but taking water, liquids fine.   Objective:    Current Vital Signs 24h Vital Sign Ranges  T 98.2 F (36.8 C) Temp  Avg: 98.6 F (37 C)  Min: 98.2 F (36.8 C)  Max: 99.1 F (37.3 C)  BP (!) 148/85 BP  Min: 114/66  Max: 167/85  HR 84 Pulse  Avg: 92.3  Min: 84  Max: 105  RR 16 Resp  Avg: 17.3  Min: 16  Max: 18  SaO2 98 % Room Air SpO2  Avg: 97.6 %  Min: 95 %  Max: 100 %       24 Hour I/O Current Shift I/O  Time Ins Outs No intake/output data recorded. No intake/output data recorded.    Patient Vitals for the past 24 hrs:  BP Temp Temp src Pulse Resp SpO2 Height Weight  10/27/19 0859 (!) 148/85 98.2 F (36.8 C) Oral 84 16 98 % -- --  10/27/19 0555 139/69 98.8 F (37.1 C) Oral 87 17 97 % -- --  10/27/19 0138 114/66 98.9 F (37.2 C) Oral 85 18 98 % -- --  10/26/19 2118 (!) 139/107 98.2 F (36.8 C) Oral 88 17 100 % 5\' 7"  (1.702 m) (!) 139.6 kg  10/26/19 2104 (!) 144/63 99.1 F (37.3 C) Oral 95 18 99 % -- --  10/26/19 1800 (!) 144/69 -- -- (!) 105 18 97 % -- --  10/26/19 1747 (!) 150/77 -- -- 100 -- 95 % --  --  10/26/19 1511 (!) 167/85 -- -- 94 -- 97 % -- --    Physical exam: General appearance: NAD, tired looking Abdomen: +BS, obese, soft, nttp, nd. Umbilical incision still well healed Lungs: clear to auscultation bilaterally Heart: S1, S2 normal, no murmur, rub or gallop, regular rate and rhythm Extremities: no c/c/e Skin: warm and dry Psych: appropriate Neurologic: Grossly normal  Medications Current Facility-Administered Medications  Medication Dose Route Frequency Provider Last Rate Last Admin  . acetaminophen (TYLENOL) tablet 1,000 mg  1,000 mg Oral Q8H PRN 10/28/19, MD      . alum & mag hydroxide-simeth (MAALOX/MYLANTA) 200-200-20 MG/5ML suspension 30 mL  30 mL Oral Q4H PRN 11-05-2000, MD      . amLODipine (NORVASC) tablet 5 mg  5 mg Oral Daily [place] Bing, MD   5 mg at 10/27/19 0935  . Chlorhexidine Gluconate Cloth 2 % PADS 6 each  6 each Topical Daily 10/29/19, MD   6 each at 10/27/19 0935  . enoxaparin (LOVENOX) injection 75 mg  75 mg Subcutaneous Q24H 10/29/19, MD   75 mg at 10/26/19 1742  . ferrous gluconate (FERGON) tablet 324 mg  324  mg Oral Q breakfast [place] Bing, MD      . ibuprofen (ADVIL) tablet 600 mg  600 mg Oral Q6H PRN [place] Bing, MD      . ondansetron (ZOFRAN) tablet 4 mg  4 mg Oral Q6H PRN [place] Bing, MD       Or  . ondansetron (ZOFRAN) injection 4 mg  4 mg Intravenous Q6H PRN [place] Bing, MD   4 mg at 10/27/19 2025  . oxyCODONE (Oxy IR/ROXICODONE) immediate release tablet 5-10 mg  5-10 mg Oral Q4H PRN [place] Bing, MD   10 mg at 10/27/19 4270  . piperacillin-tazobactam (ZOSYN) IVPB 3.375 g  3.375 g Intravenous Q8H [place]a Aldaba, Billey Gosling, MD      . polyethylene glycol (MIRALAX / GLYCOLAX) packet 17 g  17 g Oral Daily [place] Bing, MD   17 g at 10/27/19 0935  . prochlorperazine (COMPAZINE) injection 10 mg  10 mg Intravenous Q6H PRN [place] Bing, MD      . simethicone (MYLICON) chewable tablet 80 mg  80  mg Oral QID PRN [place] Bing, MD      . sodium chloride flush (NS) 0.9 % injection 10-40 mL  10-40 mL Intracatheter PRN [place] Bing, MD          Labs  Pending: GC/CT, hemoglobinopathy panel,  anemia panel c/w low iron and b12  Recent Labs  Lab 10/26/19 0704 10/27/19 0428  WBC 13.1* 9.7  HGB 7.8* 7.1*  HCT 27.7* 25.4*  PLT 694* 592*    Radiology No new imaging  Assessment & Plan:  Pt stable *GYN: continue zosyn. Will get abdominal imaging given continued nausea. Probably just a picture to slight ileus due to inflammation and need more time for abx to work. -s/p flagyl 2gm in ED *Anemia: pt states her periods are not heavy. Pt amenable to IV iron. Will order and po b12 -ED FOBT negative.  *HTN: doing well on norvasc *Pain: continue PO PRNs *FEN/GI: will add compazine *PPx: lovenox, oob ad lib *Dispo: likely HD#4  Code Status: Full Code  Total time taking care of the patient was 25 minutes, with greater than 50% of the time spent in face to face interaction with the patient.  Cornelia Copa MD Attending Center for [facility] Practice) GYN Consult Phone: [phone] (M-F, 0800-1700) & [phone] (Off hours, weekends, holidays)

## 2019-10-27 NOTE — Progress Notes (Signed)
Pt arrived to 6N18 from ED via stretcher. Pt awake and alert. See assessment. Will continue to monitor pt.

## 2019-10-28 DIAGNOSIS — A549 Gonococcal infection, unspecified: Secondary | ICD-10-CM | POA: Diagnosis present

## 2019-10-28 LAB — BASIC METABOLIC PANEL
Anion gap: 9 (ref 5–15)
BUN: <5 mg/dL — ABNORMAL LOW (ref 6–20)
CO2: 25 mmol/L (ref 22–32)
Calcium: 8.1 mg/dL — ABNORMAL LOW (ref 8.9–10.3)
Chloride: 106 mmol/L (ref 98–111)
Creatinine, Ser: 0.68 mg/dL (ref 0.44–1.00)
GFR calc Af Amer: >60 mL/min (ref >60)
GFR calc non Af Amer: >60 mL/min (ref >60)
Glucose, Bld: 111 mg/dL — ABNORMAL HIGH (ref 70–99)
Potassium: 3.4 mmol/L — ABNORMAL LOW (ref 3.5–5.1)
Sodium: 140 mmol/L (ref 135–145)

## 2019-10-28 LAB — HEMOGLOBIN A1C
Hgb A1c MFr Bld: 6.9 % — ABNORMAL HIGH (ref 4.8–5.6)
Mean Plasma Glucose: 151 mg/dL

## 2019-10-28 MED ORDER — METRONIDAZOLE 500 MG PO TABS
500.0000 mg | ORAL_TABLET | Freq: Two times a day (BID) | ORAL | Status: DC
  Administered 2019-10-28 – 2019-10-29 (×3): 500 mg via ORAL
  Filled 2019-10-28 (×3): qty 1

## 2019-10-28 MED ORDER — PIPERACILLIN-TAZOBACTAM 3.375 G IVPB: 3.3750 g | Freq: Three times a day (TID) | INTRAVENOUS | Status: DC

## 2019-10-28 MED ORDER — POTASSIUM CHLORIDE CRYS ER 20 MEQ PO TBCR
20.0000 meq | EXTENDED_RELEASE_TABLET | Freq: Two times a day (BID) | ORAL | Status: DC
  Administered 2019-10-28 – 2019-10-29 (×3): 20 meq via ORAL
  Filled 2019-10-28 (×3): qty 1

## 2019-10-28 MED ORDER — METRONIDAZOLE 500 MG PO TABS: 500.0000 mg | ORAL_TABLET | Freq: Two times a day (BID) | ORAL | Status: DC

## 2019-10-28 MED ORDER — DOXYCYCLINE HYCLATE 100 MG PO TABS
100.0000 mg | ORAL_TABLET | Freq: Two times a day (BID) | ORAL | Status: DC
  Administered 2019-10-28 – 2019-10-29 (×3): 100 mg via ORAL
  Filled 2019-10-28 (×3): qty 1

## 2019-10-28 MED ORDER — CEFTRIAXONE SODIUM 500 MG IJ SOLR
500.0000 mg | Freq: Once | INTRAMUSCULAR | Status: AC
  Administered 2019-10-28: 500 mg via INTRAMUSCULAR
  Filled 2019-10-28 (×2): qty 500

## 2019-10-28 MED ORDER — DOXYCYCLINE HYCLATE 100 MG PO TABS: 100.0000 mg | ORAL_TABLET | Freq: Two times a day (BID) | ORAL | Status: DC

## 2019-10-28 NOTE — Plan of Care (Signed)

## 2019-10-28 NOTE — Progress Notes (Addendum)
Gynecology Progress Note  Admission Date: 10/26/2019 Current Date: 10/28/2019 9:51 AM  Victoria Chavez is a 39 y.o. G0 HD#3admitted for abdominal pain, multiple pelvic abscesses seen on CT, nausea. PMhx significant for h/o April 2021 umbilical hernia repair for incarceration, BMI 40s, HTN, +trich and gonorrhea on admission   History complicated by: Patient Active Problem List   Diagnosis Date Noted  . Ileus (HCC) 10/27/2019  . Pelvic abscess in female 10/26/2019  . Trichimoniasis 10/26/2019  . Anemia 10/26/2019  . Incarcerated umbilical hernia 08/12/2019  . Morbid obesity (HCC) 02/18/2017  . Umbilical hernia 02/18/2017  . Tobacco user 02/18/2017    ROS and patient/family/surgical history, located on admission H&P note dated 10/26/2019, have been reviewed, and there are no changes except as noted below Yesterday/Overnight Events:  none  Subjective:  Feeling better, +flatus, no nausea/vomiting. Feeling hungry.   Objective:    Current Vital Signs 24h Vital Sign Ranges  T 98.4 F (36.9 C) Temp  Avg: 98.4 F (36.9 C)  Min: 98.3 F (36.8 C)  Max: 98.4 F (36.9 C)  BP (!) 148/73 BP  Min: 131/73  Max: 152/65  HR 86 Pulse  Avg: 88.3  Min: 85  Max: 93  RR 17 Resp  Avg: 16.3  Min: 14  Max: 17  SaO2 91 % Room Air SpO2  Avg: 93.5 %  Min: 91 %  Max: 95 %       24 Hour I/O Current Shift I/O  Time Ins Outs 06/30 0701 - 07/01 0700 In: 828.8 [P.O.:410; I.V.:418.8] Out: -  No intake/output data recorded.    Patient Vitals for the past 24 hrs:  BP Temp Temp src Pulse Resp SpO2  10/28/19 0411 (!) 148/73 98.4 F (36.9 C) -- 86 17 91 %  10/27/19 2048 131/73 98.3 F (36.8 C) Oral 85 17 95 %  10/27/19 1545 (!) 152/65 98.4 F (36.9 C) -- 93 17 95 %  10/27/19 1209 (!) 147/74 98.4 F (36.9 C) Oral 89 14 93 %    Physical exam: General appearance: NAD, tired looking Abdomen: +BS, obese, soft, nttp, nd. Umbilical incision still well healed Lungs: clear to auscultation bilaterally Heart: S1,  S2 normal, no murmur, rub or gallop, regular rate and rhythm Extremities: no c/c/e Skin: warm and dry Psych: appropriate Neurologic: Grossly normal  Medications Current Facility-Administered Medications  Medication Dose Route Frequency Provider Last Rate Last Admin  . acetaminophen (TYLENOL) tablet 1,000 mg  1,000 mg Oral Q8H PRN [place] Bing, MD      . alum & mag hydroxide-simeth (MAALOX/MYLANTA) 200-200-20 MG/5ML suspension 30 mL  30 mL Oral Q4H PRN [place] Bing, MD      . amLODipine (NORVASC) tablet 5 mg  5 mg Oral Daily [place] Bing, MD   5 mg at 10/28/19 0839  . Chlorhexidine Gluconate Cloth 2 % PADS 6 each  6 each Topical Daily [place] Bing, MD   6 each at 10/27/19 0935  . enoxaparin (LOVENOX) injection 70 mg  70 mg Subcutaneous Q24H Norva Pavlov, RPH   70 mg at 10/27/19 1612  . ibuprofen (ADVIL) tablet 600 mg  600 mg Oral Q6H PRN [place] Bing, MD      . lactated ringers infusion   Intravenous Continuous [place] Bing, MD 50 mL/hr at 10/28/19 0841 New Bag at 10/28/19 0841  . ondansetron (ZOFRAN) tablet 4 mg  4 mg Oral Q6H PRN [place] Bing, MD       Or  . ondansetron (ZOFRAN) injection 4 mg  4 mg Intravenous Q6H PRN [place] Bing, MD   4 mg at 10/27/19 4403  . oxyCODONE (Oxy IR/ROXICODONE) immediate release tablet 5-10 mg  5-10 mg Oral Q4H PRN [place] Bing, MD   10 mg at 10/28/19 4742  . piperacillin-tazobactam (ZOSYN) IVPB 3.375 g  3.375 g Intravenous Q8H [place] Bing, MD 12.5 mL/hr at 10/28/19 0547 3.375 g at 10/28/19 0547  . polyethylene glycol (MIRALAX / GLYCOLAX) packet 17 g  17 g Oral Daily [place] Bing, MD   17 g at 10/28/19 0839  . potassium chloride SA (KLOR-CON) CR tablet 20 mEq  20 mEq Oral BID [place] Bing, MD   20 mEq at 10/28/19 0946  . prochlorperazine (COMPAZINE) injection 10 mg  10 mg Intravenous Q6H PRN [place] Bing, MD   10 mg at 10/27/19 1014  . simethicone (MYLICON) chewable tablet 80 mg  80 mg Oral QID PRN  [place] Bing, MD      . sodium chloride flush (NS) 0.9 % injection 10-40 mL  10-40 mL Intracatheter PRN [place] Bing, MD      . vitamin B-12 (CYANOCOBALAMIN) tablet 1,000 mcg  1,000 mcg Oral Daily [place] Bing, MD   1,000 mcg at 10/28/19 0838      Labs  +gonorrhea Pending: hemoglobinopathy panel  Recent Labs  Lab 10/26/19 0704 10/27/19 0428  WBC 13.1* 9.7  HGB 7.8* 7.1*  HCT 27.7* 25.4*  PLT 694* 592*   BMP Latest Ref Rng & Units 10/28/2019 10/26/2019 08/15/2019  Glucose 70 - 99 mg/dL 595(G) 387(F) 643(P)  BUN 6 - 20 mg/dL <2(R) 8 11  Creatinine 0.44 - 1.00 mg/dL 5.18 8.41 6.60  Sodium 135 - 145 mmol/L 140 136 134(L)  Potassium 3.5 - 5.1 mmol/L 3.4(L) 3.6 3.3(L)  Chloride 98 - 111 mmol/L 106 103 103  CO2 22 - 32 mmol/L 25 21(L) 24  Calcium 8.9 - 10.3 mg/dL 8.1(L) 8.7(L) 8.1(L)   Radiology No new imaging  Assessment & Plan:  Pt stable *GYN: will give a dose of rocephin for GC coverage and start doxy and flagyl -s/p flagyl 2gm in ED *Anemia: pt states her periods are not heavy. continue po b12 -ED FOBT negative.  -s/p IV iron on 6/30 *HTN: doing well on norvasc *Pain: continue PO PRNs *FEN/GI: infectious ileus resolved. Okay for regular diet, sliv *PPx: lovenox, oob ad lib *Dispo: likely tomorrow  Code Status: Full Code  Total time taking care of the patient was 15 minutes, with greater than 50% of the time spent in face to face interaction with the patient.  Cornelia Copa MD Attending Center for [facility] (Faculty Practice) GYN Consult Phone: [phone] (M-F, 0800-1700) & [phone] (Off hours, weekends, holidays)

## 2019-10-29 DIAGNOSIS — E538 Deficiency of other specified B group vitamins: Secondary | ICD-10-CM

## 2019-10-29 DIAGNOSIS — D509 Iron deficiency anemia, unspecified: Secondary | ICD-10-CM

## 2019-10-29 LAB — HGB FRACTIONATION CASCADE
Hgb A2: 2 % (ref 1.8–3.2)
Hgb A: 98 % (ref 96.4–98.8)
Hgb F: 0 % (ref 0.0–2.0)
Hgb S: 0 %

## 2019-10-29 MED ORDER — POLYETHYLENE GLYCOL 3350 17 G PO PACK: 17.0000 g | PACK | Freq: Every day | ORAL | 0 refills | Status: DC | PRN

## 2019-10-29 MED ORDER — CYANOCOBALAMIN 1000 MCG PO TABS: 1000.0000 ug | ORAL_TABLET | Freq: Every day | ORAL | 0 refills | Status: AC

## 2019-10-29 MED ORDER — ONDANSETRON HCL 4 MG PO TABS: 4.0000 mg | ORAL_TABLET | Freq: Four times a day (QID) | ORAL | 0 refills | Status: DC | PRN

## 2019-10-29 MED ORDER — AMLODIPINE BESYLATE 5 MG PO TABS: 5.0000 mg | ORAL_TABLET | Freq: Every day | ORAL | 1 refills | Status: AC

## 2019-10-29 MED ORDER — ACETAMINOPHEN 500 MG PO TABS: 1000.0000 mg | ORAL_TABLET | Freq: Three times a day (TID) | ORAL | 0 refills | Status: DC | PRN

## 2019-10-29 MED ORDER — POTASSIUM CHLORIDE CRYS ER 20 MEQ PO TBCR: 20.0000 meq | EXTENDED_RELEASE_TABLET | Freq: Two times a day (BID) | ORAL | 0 refills | Status: DC

## 2019-10-29 MED ORDER — DOXYCYCLINE HYCLATE 100 MG PO TABS: 100.0000 mg | ORAL_TABLET | Freq: Two times a day (BID) | ORAL | 0 refills | Status: AC

## 2019-10-29 MED ORDER — METRONIDAZOLE 500 MG PO TABS: 500.0000 mg | ORAL_TABLET | Freq: Two times a day (BID) | ORAL | 0 refills | Status: AC

## 2019-10-29 MED ORDER — SIMETHICONE 80 MG PO CHEW: 80.0000 mg | CHEWABLE_TABLET | Freq: Four times a day (QID) | ORAL | 0 refills | Status: DC | PRN

## 2019-10-29 MED ORDER — OXYCODONE HCL 5 MG PO TABS: 5.0000 mg | ORAL_TABLET | ORAL | 0 refills | Status: DC | PRN

## 2019-10-29 MED FILL — DOXYCYCLINE HYCLATE 100 MG: 100 | 10 days supply | Qty: 19 | Fill #0

## 2019-10-29 MED FILL — AMLODIPINE BESYLATE 5 MG TA: 5 | 30 days supply | Qty: 30 | Fill #0

## 2019-10-29 MED FILL — ONDANSETRON HCL 4 MG TABLET: 4 | 5 days supply | Qty: 20 | Fill #0

## 2019-10-29 MED FILL — metroNIDAZOLE 500 MG TABS: 500 | 10 days supply | Qty: 19 | Fill #0

## 2019-10-29 MED FILL — oxyCODONE HCL 5 MG TABS: 5 | 5 days supply | Qty: 30 | Fill #0

## 2019-10-29 MED FILL — VITAMIN B-12 1000 MCG TABS: 1000 | 12 days supply | Qty: 12 | Fill #0

## 2019-10-29 MED FILL — POTASSIUM CHLORIDE 20meqER: 20 | 3 days supply | Qty: 3 | Fill #0

## 2019-10-29 MED FILL — MI-ACID GAS 80 MG TAB CHEW: 80 | 5 days supply | Qty: 30 | Fill #0

## 2019-10-29 MED FILL — POLYETHYLENE GLYCOL 3350 PO: 17 | 30 days supply | Qty: 510 | Fill #0

## 2019-10-29 NOTE — TOC Initial Note (Signed)
Transition of Care [facility]) - Initial/Assessment Note    Patient Details  Name: Victoria Chavez MRN: [id number] Date of Birth: 07/24/1980  [facility] CM/SW Contact:    Kingsley Plan, RN Phone Number: 10/29/2019, 10:35 AM  Clinical Narrative:                  Assisted patient with discharge medications through [facility]. Patient had recently used MATCH and only eligible once in a year, but did provide antibiotics free of charge. Patient will not be able to use MATCH again for a complete year.  Discussed [employer] [facility]ness and provided information.  Patient voiced understanding of all of above.  Expected Discharge Plan: Home/Self Care Barriers to Discharge: No Barriers Identified   Patient Goals and CMS Choice Patient states their goals for this hospitalization and ongoing recovery are:: to return to home CMS Medicare.gov Compare Post Acute Care list provided to:: Patient Choice offered to / list presented to : NA  Expected Discharge Plan and Services Expected Discharge Plan: Home/Self Care In-house Referral: Financial Counselor Discharge Planning Services: MATCH Program, Medication Assistance, Indigent Health Clinic   Living arrangements for the past 2 months: Single Family Home Expected Discharge Date: 10/29/19               DME Arranged: N/A         HH Arranged: NA          Prior Living Arrangements/Services Living arrangements for the past 2 months: Single Family Home Lives with:: Self Patient language and need for interpreter reviewed:: Yes        Need for Family Participation in Patient Care: Yes (Comment) Care giver support system in place?: Yes (comment)   Criminal Activity/Legal Involvement Pertinent to Current Situation/Hospitalization: No - Comment as needed  Activities of Daily Living      Permission Sought/Granted   Permission granted to share information with : No              Emotional Assessment Appearance:: Appears  stated age Attitude/Demeanor/Rapport: Engaged Affect (typically observed): Accepting Orientation: : Oriented to Self, Oriented to Place, Oriented to  Time, Oriented to Situation Alcohol / Substance Use: Not Applicable Psych Involvement: No (comment)  Admission diagnosis:  Postoperative abscess [T81.49XA] Pelvic abscess in female [N73.9] Patient Active Problem List   Diagnosis Date Noted   Gonorrhea 10/28/2019   Ileus (HCC) 10/27/2019   Pelvic abscess in female 10/26/2019   Trichimoniasis 10/26/2019   Anemia 10/26/2019   Incarcerated umbilical hernia 08/12/2019   Morbid obesity (HCC) 02/18/2017   Umbilical hernia 02/18/2017   Tobacco user 02/18/2017   PCP:  Default, Provider, MD Pharmacy:   [id number] - [place] - [address] [place] ([place] [address] Phone: [phone] Fax: [phone]  [facility] [place], [place] - [address] [place] [address] Phone: [phone] Fax: [phone]     Social Determinants of Health (SDOH) Interventions    Readmission Risk Interventions Readmission Risk Prevention Plan 08/15/2019  Post Dischage Appt Complete  Medication Screening Complete  Transportation Screening Complete  Some recent data might be hidden

## 2019-10-29 NOTE — Progress Notes (Signed)
Discharged patient to home, AVS given and explained. Meds delivered to room via [facility] pharmacy. Belongings returned accordingly.

## 2019-10-29 NOTE — Discharge Instructions (Signed)
Pelvic Inflammatory Disease  Pelvic inflammatory disease (PID) is an infection in some or all of the female reproductive organs. PID can be in the womb (uterus), ovaries, fallopian tubes, or nearby tissues that are inside the lower belly area (pelvis). PID can lead to problems if it is not treated. What are the causes?  Germs (bacteria) that are spread during sex. This is the most common cause.  Germs in the vagina that are not spread during sex.  Germs that travel up from the vagina or cervix to the reproductive organs after: ? The birth of a baby. ? A miscarriage. ? An abortion. ? Pelvic surgery. ? Insertion of an intrauterine device (IUD). ? A sexual assault. What increases the risk?  Being younger than 39 years old.  Having sex at a young age.  Having a history of STI (sexually transmitted infection) or PID.  Not using barrier birth control, such as condoms.  Having a lot of sex partners.  Having sex with someone who has symptoms of an STI.  Using a douche.  Having an IUD put in place. What are the signs or symptoms?  Pain in the belly area.  Fever.  Chills.  Discharge from the vagina that is not normal.  Bleeding from the womb that is not normal.  Pain soon after the end of a menstrual period.  Pain when you pee (urinate).  Pain with sex.  Feeling sick to your stomach (nauseous) or throwing up (vomiting). How is this treated?  Antibiotic medicines. In very bad cases, these may be given through an IV tube.  Surgery. This is rare.  Efforts to stop the spread of the infection. Sex partners may need to be treated. It may take weeks until you feel all better. Your doctor may test you for infection again after you finish treatment. You should also be checked for HIV (human immunodeficiency virus). Follow these instructions at home:  Take over-the-counter and prescription medicines only as told by your doctor.  If you were prescribed an antibiotic  medicine, take it as told by your doctor. Do not stop taking it even if you start to feel better.  Do not have sex until treatment is done or as told by your doctor.  Tell your sex partner if you have PID. Your partner may need to be treated.  Keep all follow-up visits as told by your doctor. This is important. Contact a doctor if:  You have more fluid or fluid that is not normal coming from your vagina.  Your pain does not improve.  You throw up.  You have a fever.  You cannot take your medicines.  Your partner has an STI.  You have pain when you pee. Get help right away if:  You have more pain in the belly area.  You have chills.  You are not better in 72 hours with treatment. Summary  Pelvic inflammatory disease (PID) is caused by an infection in some or all of the female reproductive organs.  PID is a serious infection.  This infection is most often treated with antibiotics.  Do not have sex until treatment is done or as told by your doctor. This information is not intended to replace advice given to you by your health care provider. Make sure you discuss any questions you have with your health care provider. Document Revised: 01/01/2018 Document Reviewed: 01/07/2018 Elsevier Patient Education  2020 [employer]  

## 2019-10-29 NOTE — Discharge Summary (Addendum)
Physician Discharge Summary  Patient ID: Victoria Chavez MRN: [id number] DOB/AGE: [birth year] 39 y.o.  Admit date: 10/26/2019 Discharge date: 10/29/2019  Admission Diagnoses: Abdominal pain, nausea, pelvic abscess. HTN. Anemia  Discharge Diagnoses: Resolving abdominal pain and abscesses, +trichonomonas and gonorrhea. HTN. Chronic iron deficiency anemia and B12 deficiency.   Discharged Condition: good  Hospital Course: Patient admitted from the ED with the above CC. Patient seen by Gen Surg in the ED due to history of April 2021 incarcerated bowel, hernia surgery. They felt her s/s were not due to the recent surgery. Patient started on Zosyn and admitted to GYN. She was treated with Flagyl 2gm and Rocephin 500mg  IM x 1. She was transitioned to doxycycline and flagyl and did well on this. During her stay she did have an ileus for approximately 24 hours, which was likely due to the pelvic abscesses and inflammation. This resolved with keeping her on a full liquid diet.   She was started on norvasc 5mg  for her HTN and she did well on this.   For her anemia, her anemia panel and hemoglobinopathy panel were negative except for low iron and low B12. She was given a dose of IV Nulecit 125mg  x 1 and started on oral B12.  Her FOBT in the ED was negative   Consults: general surgery  Significant Diagnostic Studies: CT A/P and abdominal x-ray  Treatments: IV and PO antibiotics  Discharge Exam: Blood pressure (!) 153/79, pulse 78, temperature 98.9 F (37.2 C), temperature source Oral, resp. rate 18, height 5\' 7"  (1.702 m), weight (!) 139.6 kg, SpO2 97 %. General appearance: alert Resp: clear to auscultation bilaterally Cardio: regular rate and rhythm, S1, S2 normal, no murmur, click, rub or gallop GI: obese, soft, nttp, nd Extremities: extremities normal, atraumatic, no cyanosis or edema Skin: Skin color, texture, turgor normal. No rashes or lesions Neurologic: Grossly normal  Disposition: Discharge  disposition: 01-Home or Self Care       Discharge Instructions    Discharge patient   Complete by: As directed    Discharge disposition: 01-Home or Self Care   Discharge patient date: 10/29/2019     Allergies as of 10/29/2019   No Known Allergies     Medication List    STOP taking these medications   megestrol 40 MG tablet Commonly known as: MEGACE     TAKE these medications   acetaminophen 500 MG tablet Commonly known as: TYLENOL Take 2 tablets (1,000 mg total) by mouth every 8 (eight) hours as needed for moderate pain, fever or headache.   albuterol 108 (90 Base) MCG/ACT inhaler Commonly known as: VENTOLIN HFA Inhale 2 puffs into the lungs every 6 (six) hours as needed. For shortness of breath   amLODipine 5 MG tablet Commonly known as: NORVASC Take 1 tablet (5 mg total) by mouth daily.   cyanocobalamin 1000 MCG tablet Take 1 tablet (1,000 mcg total) by mouth daily for 12 days.   doxycycline 100 MG tablet Commonly known as: VIBRA-TABS Take 1 tablet (100 mg total) by mouth every 12 (twelve) hours for 19 doses.   erythromycin ophthalmic ointment Place a 1/2 inch ribbon of ointment into the lower eyelid.   ibuprofen 200 MG tablet Commonly known as: ADVIL Take 600 mg by mouth every 6 (six) hours as needed for moderate pain. For pain   ketorolac 0.5 % ophthalmic solution Commonly known as: ACULAR Place 1 drop into the left eye every 6 (six) hours.   metroNIDAZOLE 500 MG tablet Commonly known  as: FLAGYL Take 1 tablet (500 mg total) by mouth every 12 (twelve) hours for 19 doses.   ondansetron 4 MG tablet Commonly known as: ZOFRAN Take 1 tablet (4 mg total) by mouth every 6 (six) hours as needed for nausea.   oxyCODONE 5 MG immediate release tablet Commonly known as: Oxy IR/ROXICODONE Take 1-2 tablets (5-10 mg total) by mouth every 4 (four) hours as needed for moderate pain.   polyethylene glycol 17 g packet Commonly known as: MIRALAX / GLYCOLAX Take 17 g  by mouth daily as needed.   potassium chloride SA 20 MEQ tablet Commonly known as: KLOR-CON Take 1 tablet (20 mEq total) by mouth 2 (two) times daily for 3 doses.   simethicone 80 MG chewable tablet Commonly known as: MYLICON Chew 1 tablet (80 mg total) by mouth 4 (four) times daily as needed for flatulence (gas).       Follow-up Information    [facility]. Schedule an appointment as soon as possible for a visit.   Contact information: [address] [address] [phone]       [facility]. Go in 2 week(s).   Specialty: Obstetrics and Gynecology Contact information: [address] [place] [address] [phone]              Signed: [place] Bing 10/29/2019, 8:51 AM

## 2019-11-17 ENCOUNTER — Encounter: Payer: Self-pay | Admitting: Obstetrics and Gynecology

## 2019-11-17 ENCOUNTER — Ambulatory Visit: Payer: Self-pay | Admitting: Obstetrics and Gynecology

## 2019-11-17 NOTE — Progress Notes (Signed)
Patient did not keep her GYN appointment for 11/17/2019.  Cornelia Copa MD Attending [facility])

## 2020-02-29 ENCOUNTER — Emergency Department (HOSPITAL_COMMUNITY)
Admission: EM | Admit: 2020-02-29 | Discharge: 2020-02-29 | Disposition: A | Discharge status: Home / Self Care | Payer: Self-pay | Attending: Emergency Medicine | Admitting: Emergency Medicine

## 2020-02-29 ENCOUNTER — Encounter (HOSPITAL_COMMUNITY): Payer: Self-pay

## 2020-02-29 ENCOUNTER — Other Ambulatory Visit: Payer: Self-pay

## 2020-02-29 DIAGNOSIS — F17210 Nicotine dependence, cigarettes, uncomplicated: Secondary | ICD-10-CM | POA: Insufficient documentation

## 2020-02-29 DIAGNOSIS — K0889 Other specified disorders of teeth and supporting structures: Secondary | ICD-10-CM | POA: Insufficient documentation

## 2020-02-29 DIAGNOSIS — K029 Dental caries, unspecified: Secondary | ICD-10-CM | POA: Insufficient documentation

## 2020-02-29 MED ORDER — AMOXICILLIN-POT CLAVULANATE 875-125 MG PO TABS
1.0000 | ORAL_TABLET | Freq: Two times a day (BID) | ORAL | 0 refills | Status: DC
Start: 2020-02-29 — End: 2023-04-02

## 2020-02-29 MED ORDER — OXYCODONE-ACETAMINOPHEN 5-325 MG PO TABS
1.0000 | ORAL_TABLET | ORAL | Status: DC | PRN
  Administered 2020-02-29: 1 via ORAL
  Filled 2020-02-29: qty 1

## 2020-02-29 MED ORDER — HYDROCODONE-ACETAMINOPHEN 5-325 MG PO TABS: 1.0000 | ORAL_TABLET | Freq: Four times a day (QID) | ORAL | 0 refills | Status: DC | PRN

## 2020-02-29 NOTE — Discharge Instructions (Signed)
Take the antibiotics as prescribed.  Complete the entire course of antibiotics regardless of symptom improvement prevent worsening or recurrence of your infection. It is important for you to ultimately follow-up with a dentist.  You can follow-up with the dentist listed below. Return to the ER if you start to experience worsening swelling past 2 days after starting the antibiotics, trouble breathing, trouble swallowing, drainage from the area.

## 2020-02-29 NOTE — ED Provider Notes (Signed)
[facility] EMERGENCY DEPARTMENT Provider Note   CSN: [id number] Arrival date & time: 02/29/20  1319     History Chief Complaint  Patient presents with  . Dental Pain    Victoria Chavez is a 39 y.o. female with a past medical history of obesity presenting to the ED with a chief complaint of dental pain.  Reports a few days ago she had chipped a tooth on her left upper mouth.  She was trying to see a dentist today but due to her lack of insurance I told her would be a $400 co-pay.  She came to the ED for further recommendations for possible infection.  Woke up this morning with some swelling to the left upper jaw area.  She has taken Tylenol with some improvement in her symptoms.  Denies any trouble breathing, trouble swallowing, neck pain or stiffness, drainage from the area, fever or chest pain.  HPI     Past Medical History:  Diagnosis Date  . Abdominal hernia   . Morbid obesity [facility])     Patient Active Problem List   Diagnosis Date Noted  . Gonorrhea 10/28/2019  . Ileus (HCC) 10/27/2019  . Pelvic abscess in female 10/26/2019  . Trichimoniasis 10/26/2019  . Anemia 10/26/2019  . Incarcerated umbilical hernia 08/12/2019  . Morbid obesity (HCC) 02/18/2017  . Umbilical hernia 02/18/2017  . Tobacco user 02/18/2017    Past Surgical History:  Procedure Laterality Date  . HERNIA REPAIR    . INCISIONAL HERNIA REPAIR N/A 08/12/2019   Procedure: Recurrent incisional hernia repair;  Surgeon: Axel Filler, MD;  Location: [facility] OR;  Service: General;  Laterality: N/A;  . LAPAROTOMY N/A 08/12/2019   Procedure: EXPLORATORY LAPAROTOMY;  Surgeon: Axel Filler, MD;  Location: [facility] OR;  Service: General;  Laterality: N/A;  . OMENTECTOMY N/A 08/12/2019   Procedure: Partial Omentectomy;  Surgeon: Axel Filler, MD;  Location: [facility] OR;  Service: General;  Laterality: N/A;     OB History    Gravida  0   Para  0   Term  0   Preterm  0   AB  0   Living  0       SAB  0   TAB  0   Ectopic  0   Multiple  0   Live Births  0           History reviewed. No pertinent family history.  Social History   Tobacco Use  . Smoking status: Current Every Day Smoker    Packs/day: 0.50    Types: Cigarettes  . Smokeless tobacco: Never Used  Substance Use Topics  . Alcohol use: Yes  . Drug use: Yes    Frequency: 14.0 times per week    Types: Marijuana    Home Medications Prior to Admission medications   Medication Sig Start Date End Date Taking? Authorizing Provider  albuterol (PROVENTIL HFA;VENTOLIN HFA) 108 (90 BASE) MCG/ACT inhaler Inhale 2 puffs into the lungs every 6 (six) hours as needed. For shortness of breath    [provider]  amLODipine (NORVASC) 5 MG tablet Take 1 tablet (5 mg total) by mouth daily. 10/29/19   Eden Bing, MD  amoxicillin-clavulanate (AUGMENTIN) 875-125 MG tablet Take 1 tablet by mouth every 12 (twelve) hours. 02/29/20   Orvis Stann, PA-C  HYDROcodone-acetaminophen (NORCO/VICODIN) 5-325 MG tablet Take 1 tablet by mouth every 6 (six) hours as needed. 02/29/20   Raevyn Sokol, PA-C  ibuprofen (ADVIL,MOTRIN) 200 MG tablet Take  600 mg by mouth every 6 (six) hours as needed for moderate pain. For pain     [provider]  ondansetron (ZOFRAN) 4 MG tablet Take 1 tablet (4 mg total) by mouth every 6 (six) hours as needed for nausea. 10/29/19   [place] Bing, MD  polyethylene glycol (MIRALAX / GLYCOLAX) 17 g packet Take 17 g by mouth daily as needed. 10/29/19   [place] Bing, MD  potassium chloride SA (KLOR-CON) 20 MEQ tablet Take 1 tablet (20 mEq total) by mouth 2 (two) times daily for 3 doses. 10/29/19 10/31/19  [place] Bing, MD  simethicone (MYLICON) 80 MG chewable tablet Chew 1 tablet (80 mg total) by mouth 4 (four) times daily as needed for flatulence (gas). 10/29/19   Colbert Bing, MD    Allergies    Patient has no known allergies.  Review of Systems   Review of Systems  Constitutional:  Negative for chills and fever.  HENT: Positive for dental problem.   Respiratory: Negative for shortness of breath.   Cardiovascular: Negative for chest pain.  Musculoskeletal: Negative for neck pain and neck stiffness.    Physical Exam Updated Vital Signs BP (!) 167/108 (BP Location: Right Arm)   Pulse 83   Temp 98.4 F (36.9 C) (Oral)   Resp 16   SpO2 97%   Physical Exam Vitals and nursing note reviewed.  Constitutional:      General: She is not in acute distress.    Appearance: She is well-developed. She is not diaphoretic.     Comments: Speaking in complete sentences without difficulty.  No signs of respiratory distress.  HENT:     Head: Normocephalic and atraumatic.     Mouth/Throat:     Dentition: Abnormal dentition. Gingival swelling and dental caries present. No dental tenderness or dental abscesses.      Comments: Chipped tooth in the area with some swelling noted.  No fluctuance noted.  Slight left-sided facial swelling. No neck or cheek swelling noted. No pooling of secretions or trismus.  Normal voice noted with no difficulty swallowing or breathing.  No submandibular erythema, edema or crepitus noted. Eyes:     General: No scleral icterus.    Conjunctiva/sclera: Conjunctivae normal.  Cardiovascular:     Rate and Rhythm: Normal rate and regular rhythm.     Heart sounds: Normal heart sounds.  Pulmonary:     Effort: Pulmonary effort is normal. No respiratory distress.  Musculoskeletal:     Cervical back: Normal range of motion.  Skin:    Findings: No rash.  Neurological:     Mental Status: She is alert.     ED Results / Procedures / Treatments   Labs (all labs ordered are listed, but only abnormal results are displayed) Labs Reviewed - No data to display  EKG None  Radiology No results found.  Procedures Procedures (including critical care time)  Medications Ordered in ED Medications  oxyCODONE-acetaminophen (PERCOCET/ROXICET) 5-325 MG per tablet  1 tablet (1 tablet Oral Given 02/29/20 1434)    ED Course  I have reviewed the triage vital signs and the nursing notes.  Pertinent labs & imaging results that were available during my care of the patient were reviewed by me and considered in my medical decision making (see chart for details).    MDM Rules/Calculators/A&P                          Patient with dentalgia. On exam, there is  no evidence of a drainable abscess. No trismus, glossal elevation, unilateral tonsillar swelling. No evidence of retropharyngeal or peritonsillar abscess or Ludwig angina. Will treat with  Augmentin and short course of Norco. Pt instructed to follow-up with dentist as soon as possible. Resource guide provided with AVS.   Patient is hemodynamically stable, in NAD, and able to ambulate in the ED. Evaluation does not show pathology that would require ongoing emergent intervention or inpatient treatment. I explained the diagnosis to the patient. Pain has been managed and has no complaints prior to discharge. Patient is comfortable with above plan and is stable for discharge at this time. All questions were answered prior to disposition. Strict return precautions for returning to the ED were discussed. Encouraged follow up with PCP.   Prior to providing a prescription for a controlled substance, I independently reviewed the patient's recent prescription history on the [place] Controlled Substance Reporting System. The patient had no recent or regular prescriptions and was deemed appropriate for a brief, less than 3 day prescription of narcotic for acute analgesia.  An After Visit Summary was printed and given to the patient.   Portions of this note were generated with Scientist, clinical (histocompatibility and immunogenetics). Dictation errors may occur despite best attempts at proofreading.   Final Clinical Impression(s) / ED Diagnoses Final diagnoses:  Pain, dental    Rx / DC Orders ED Discharge Orders         Ordered     HYDROcodone-acetaminophen (NORCO/VICODIN) 5-325 MG tablet  Every 6 hours PRN        02/29/20 1754    amoxicillin-clavulanate (AUGMENTIN) 875-125 MG tablet  Every 12 hours        02/29/20 1754           Dietrich Pates, PA-C 02/29/20 1755    Cathren Laine, MD 03/01/20 1851

## 2020-02-29 NOTE — ED Triage Notes (Signed)
Pt presents with Left side facial swelling, reports recently chipped a tooth in that side. Woke this am with swelling and pain. Dentist was supposed to pull her tooth today but sent her here d/t infection.

## 2020-03-01 ENCOUNTER — Other Ambulatory Visit: Payer: Self-pay

## 2020-03-01 ENCOUNTER — Encounter (HOSPITAL_COMMUNITY): Payer: Self-pay | Admitting: Emergency Medicine

## 2020-03-01 ENCOUNTER — Emergency Department (HOSPITAL_COMMUNITY)
Admission: EM | Admit: 2020-03-01 | Discharge: 2020-03-01 | Disposition: A | Discharge status: Home / Self Care | Payer: Self-pay | Attending: Emergency Medicine | Admitting: Emergency Medicine

## 2020-03-01 DIAGNOSIS — F17210 Nicotine dependence, cigarettes, uncomplicated: Secondary | ICD-10-CM | POA: Insufficient documentation

## 2020-03-01 DIAGNOSIS — K047 Periapical abscess without sinus: Secondary | ICD-10-CM | POA: Insufficient documentation

## 2020-03-01 DIAGNOSIS — F1590 Other stimulant use, unspecified, uncomplicated: Secondary | ICD-10-CM | POA: Insufficient documentation

## 2020-03-01 MED ORDER — KETOROLAC TROMETHAMINE 15 MG/ML IJ SOLN
15.0000 mg | Freq: Once | INTRAMUSCULAR | Status: AC
  Administered 2020-03-01: 15 mg via INTRAMUSCULAR
  Filled 2020-03-01: qty 1

## 2020-03-01 NOTE — ED Provider Notes (Signed)
[facility] EMERGENCY DEPARTMENT Provider Note   CSN: [id number] Arrival date & time: 03/01/20  1224     History Chief Complaint  Patient presents with  . Dental Pain    Victoria Chavez is a 39 y.o. female.  Patient presents to the emergency department for evaluation and recheck of left-sided dental abscess.  Patient was seen in the emergency department yesterday and was started on Augmentin, of which she has taken 2 doses total.  She was also given a course of Norco.  Patient states that she is attempting to find follow-up.  Overnight her swelling worsened and she began having swelling in her left lower cheek.  She states pressure in her face and on her eye and to her lower jaw.  No difficulties breathing.  She has been eating chicken broth and applesauce.  No fevers.  Onset of symptoms gradual.  Course is worsening.        Past Medical History:  Diagnosis Date  . Abdominal hernia   . Morbid obesity [facility])     Patient Active Problem List   Diagnosis Date Noted  . Gonorrhea 10/28/2019  . Ileus (HCC) 10/27/2019  . Pelvic abscess in female 10/26/2019  . Trichimoniasis 10/26/2019  . Anemia 10/26/2019  . Incarcerated umbilical hernia 08/12/2019  . Morbid obesity (HCC) 02/18/2017  . Umbilical hernia 02/18/2017  . Tobacco user 02/18/2017    Past Surgical History:  Procedure Laterality Date  . HERNIA REPAIR    . INCISIONAL HERNIA REPAIR N/A 08/12/2019   Procedure: Recurrent incisional hernia repair;  Surgeon: Axel Filler, MD;  Location: [facility] OR;  Service: General;  Laterality: N/A;  . LAPAROTOMY N/A 08/12/2019   Procedure: EXPLORATORY LAPAROTOMY;  Surgeon: Axel Filler, MD;  Location: [facility] OR;  Service: General;  Laterality: N/A;  . OMENTECTOMY N/A 08/12/2019   Procedure: Partial Omentectomy;  Surgeon: Axel Filler, MD;  Location: [facility] OR;  Service: General;  Laterality: N/A;     OB History    Gravida  0   Para  0   Term  0   Preterm  0    AB  0   Living  0     SAB  0   TAB  0   Ectopic  0   Multiple  0   Live Births  0           History reviewed. No pertinent family history.  Social History   Tobacco Use  . Smoking status: Current Every Day Smoker    Packs/day: 0.50    Types: Cigarettes  . Smokeless tobacco: Never Used  Substance Use Topics  . Alcohol use: Yes  . Drug use: Yes    Frequency: 14.0 times per week    Types: Marijuana    Home Medications Prior to Admission medications   Medication Sig Start Date End Date Taking? Authorizing Provider  albuterol (PROVENTIL HFA;VENTOLIN HFA) 108 (90 BASE) MCG/ACT inhaler Inhale 2 puffs into the lungs every 6 (six) hours as needed. For shortness of breath   Yes [provider]  amLODipine (NORVASC) 5 MG tablet Take 1 tablet (5 mg total) by mouth daily. 10/29/19  Yes [place] Bing, MD  amoxicillin-clavulanate (AUGMENTIN) 875-125 MG tablet Take 1 tablet by mouth every 12 (twelve) hours. 02/29/20  Yes Khatri, Hina, PA-C  HYDROcodone-acetaminophen (NORCO/VICODIN) 5-325 MG tablet Take 1 tablet by mouth every 6 (six) hours as needed. Patient taking differently: Take 1 tablet by mouth every 6 (six) hours as needed for  moderate pain.  02/29/20  Yes Khatri, Hina, PA-C  ibuprofen (ADVIL,MOTRIN) 200 MG tablet Take 800 mg by mouth every 6 (six) hours as needed for moderate pain. For pain    Yes [provider]  ondansetron (ZOFRAN) 4 MG tablet Take 1 tablet (4 mg total) by mouth every 6 (six) hours as needed for nausea. 10/29/19  Yes [place] Bing, MD  polyethylene glycol (MIRALAX / GLYCOLAX) 17 g packet Take 17 g by mouth daily as needed. 10/29/19  Yes [place] Bing, MD    Allergies    Patient has no known allergies.  Review of Systems   Review of Systems  Constitutional: Negative for fever.  HENT: Positive for dental problem and facial swelling. Negative for ear pain, sore throat and trouble swallowing.   Respiratory: Negative for shortness of  breath and stridor.   Musculoskeletal: Negative for neck pain.  Skin: Negative for color change.  Neurological: Negative for headaches.    Physical Exam Updated Vital Signs BP (!) 178/111 (BP Location: Right Arm)   Pulse 88   Temp 98.4 F (36.9 C) (Oral)   Resp 16   Ht 5\' 7"  (1.702 m)   Wt 136.1 kg   SpO2 98%   BMI 46.99 kg/m   Physical Exam Vitals and nursing note reviewed.  Constitutional:      Appearance: She is well-developed.  HENT:     Head: Normocephalic and atraumatic.     Jaw: No trismus.     Right Ear: Tympanic membrane, ear canal and external ear normal.     Left Ear: Tympanic membrane, ear canal and external ear normal.     Nose: Nose normal.     Mouth/Throat:     Dentition: Abnormal dentition. Dental caries present. No dental abscesses.     Pharynx: Uvula midline. No uvula swelling.     Tonsils: No tonsillar abscesses.     Comments: Patient with left-sided facial swelling and suspected abscess palpable.  Appears to be coming from the base of tooth #12 or 13.  Patient also has soft tissue swelling without fluctuance or induration extending to the inferior left cheek. Eyes:     Conjunctiva/sclera: Conjunctivae normal.  Neck:     Comments: No neck swelling or Ludwig's angina Musculoskeletal:     Cervical back: Normal range of motion and neck supple.  Lymphadenopathy:     Cervical: No cervical adenopathy.  Skin:    General: Skin is warm and dry.  Neurological:     Mental Status: She is alert.     ED Results / Procedures / Treatments   Labs (all labs ordered are listed, but only abnormal results are displayed) Labs Reviewed - No data to display  EKG None  Radiology No results found.  Procedures Procedures (including critical care time)  Medications Ordered in ED Medications  ketorolac (TORADOL) 15 MG/ML injection 15 mg (has no administration in time range)    ED Course  I have reviewed the triage vital signs and the nursing  notes.  Pertinent labs & imaging results that were available during my care of the patient were reviewed by me and considered in my medical decision making (see chart for details).  11:37 AM Patient seen and examined.  I discussed current treatment regimen with patient.  I did offer dose of IV antibiotics versus continued treatment at home with Augmentin and pain control.  We did discuss that at this point, being on treatment for less than 24 hours, I do not expect  to see significant improvement and can possibly see short-term worsening.  Patient defers IV treatment at this time.  Will give dose of IM Toradol for pain.  Vital signs reviewed and are as follows: BP (!) 178/111 (BP Location: Right Arm)   Pulse 88   Temp 98.4 F (36.9 C) (Oral)   Resp 16   Ht 5\' 7"  (1.702 m)   Wt 136.1 kg   SpO2 98%   BMI 46.99 kg/m   Patient counseled to take prescribed medications as directed, return with worsening facial or neck swelling, and to follow-up with their dentist as soon as possible.     MDM Rules/Calculators/A&P                          Patient presents for follow-up of periapical abscess. They do not have a fever and do not appear septic. Exam unconcerning for Ludwig's angina or other deep tissue infection in neck and I do not feel that advanced imaging is indicated at this time. Low suspicion for PTA, RPA, epiglottis based on exam.   Patient will be treated for dental infection with antibiotic.  I agree with Augmentin prescribed yesterday.  Encouraged follow-up with a dentist for definitive and long-term management.    Final Clinical Impression(s) / ED Diagnoses Final diagnoses:  Periapical abscess    Rx / DC Orders ED Discharge Orders    None       , PA-C 03/01/20 1138    [place], 13/03/21, MD 03/01/20 [phone]

## 2020-03-01 NOTE — ED Triage Notes (Signed)
Pt arrives to ED with complaints of increased swelling to left side of face from dental pain. Was here yesterday and d/c with amoxicillin and hydrocodone. Not in acute distress.

## 2020-03-01 NOTE — Discharge Instructions (Signed)
Please read and follow all provided instructions.  Your diagnoses today include:  1. Periapical abscess     The exam and treatment you received today has been provided on an emergency basis only. This is not a substitute for complete medical or dental care.  Tests performed today include:  Vital signs. See below for your results today.   Medications prescribed:   None  Take any prescribed medications only as directed.  Home care instructions:  Follow any educational materials contained in this packet.  Please continue to take the oral antibiotics and pain medications as previously prescribed.  You may initially have some worsening pain and swelling as the antibiotic start to work.  You should start to have improvement after about 48 hours.  Follow-up instructions: Please follow-up with your dentist for further evaluation of your symptoms.   Dental Assistance: See attached dental referral and/or resource guide.   Return instructions:   Please return to the Emergency Department if you experience worsening symptoms.  Please return if you develop a fever, you develop more swelling in your face or neck, you have trouble breathing or swallowing food.  Please return if you have any other emergent concerns.  Additional Information:  Your vital signs today were: BP (!) 178/111 (BP Location: Right Arm)   Pulse 88   Temp 98.4 F (36.9 C) (Oral)   Resp 16   Ht 5\' 7"  (1.702 m)   Wt 136.1 kg   SpO2 98%   BMI 46.99 kg/m  If your blood pressure (BP) was elevated above 135/85 this visit, please have this repeated by your doctor within one month. --------------

## 2021-01-18 ENCOUNTER — Other Ambulatory Visit: Payer: Self-pay

## 2021-01-18 ENCOUNTER — Emergency Department (HOSPITAL_COMMUNITY)
Admission: EM | Admit: 2021-01-18 | Discharge: 2021-01-18 | Disposition: A | Discharge status: Home / Self Care | Payer: Self-pay | Attending: Emergency Medicine | Admitting: Emergency Medicine

## 2021-01-18 ENCOUNTER — Encounter (HOSPITAL_COMMUNITY): Payer: Self-pay | Admitting: *Deleted

## 2021-01-18 ENCOUNTER — Emergency Department (HOSPITAL_COMMUNITY): Payer: Self-pay

## 2021-01-18 DIAGNOSIS — R102 Pelvic and perineal pain: Secondary | ICD-10-CM | POA: Insufficient documentation

## 2021-01-18 DIAGNOSIS — Z87891 Personal history of nicotine dependence: Secondary | ICD-10-CM | POA: Insufficient documentation

## 2021-01-18 DIAGNOSIS — K439 Ventral hernia without obstruction or gangrene: Secondary | ICD-10-CM | POA: Insufficient documentation

## 2021-01-18 LAB — COMPREHENSIVE METABOLIC PANEL
ALT: 15 U/L (ref 0–44)
AST: 15 U/L (ref 15–41)
Albumin: 3.4 g/dL — ABNORMAL LOW (ref 3.5–5.0)
Alkaline Phosphatase: 65 U/L (ref 38–126)
Anion gap: 8 (ref 5–15)
BUN: 6 mg/dL (ref 6–20)
CO2: 23 mmol/L (ref 22–32)
Calcium: 8.7 mg/dL — ABNORMAL LOW (ref 8.9–10.3)
Chloride: 103 mmol/L (ref 98–111)
Creatinine, Ser: 0.61 mg/dL (ref 0.44–1.00)
GFR, Estimated: >60 mL/min (ref >60)
Glucose, Bld: 111 mg/dL — ABNORMAL HIGH (ref 70–99)
Potassium: 3.4 mmol/L — ABNORMAL LOW (ref 3.5–5.1)
Sodium: 134 mmol/L — ABNORMAL LOW (ref 135–145)
Total Bilirubin: 0.8 mg/dL (ref 0.3–1.2)
Total Protein: 6.9 g/dL (ref 6.5–8.1)

## 2021-01-18 LAB — URINALYSIS, MICROSCOPIC (REFLEX)

## 2021-01-18 LAB — URINALYSIS, ROUTINE W REFLEX MICROSCOPIC
Bilirubin Urine: NEGATIVE
Glucose, UA: NEGATIVE mg/dL
Hgb urine dipstick: NEGATIVE
Ketones, ur: NEGATIVE mg/dL
Nitrite: NEGATIVE
Protein, ur: NEGATIVE mg/dL
Specific Gravity, Urine: 1.025 (ref 1.005–1.030)
pH: 6 (ref 5.0–8.0)

## 2021-01-18 LAB — CBC WITH DIFFERENTIAL/PLATELET
Abs Immature Granulocytes: 0.02 10*3/uL (ref 0.00–0.07)
Basophils Absolute: 0 10*3/uL (ref 0.0–0.1)
Basophils Relative: 1 %
Eosinophils Absolute: 0.1 10*3/uL (ref 0.0–0.5)
Eosinophils Relative: 1 %
HCT: 33.6 % — ABNORMAL LOW (ref 36.0–46.0)
Hemoglobin: 10.1 g/dL — ABNORMAL LOW (ref 12.0–15.0)
Immature Granulocytes: 0 %
Lymphocytes Relative: 25 %
Lymphs Abs: 2.2 10*3/uL (ref 0.7–4.0)
MCH: 21.8 pg — ABNORMAL LOW (ref 26.0–34.0)
MCHC: 30.1 g/dL (ref 30.0–36.0)
MCV: 72.6 fL — ABNORMAL LOW (ref 80.0–100.0)
Monocytes Absolute: 0.9 10*3/uL (ref 0.1–1.0)
Monocytes Relative: 11 %
Neutro Abs: 5.5 10*3/uL (ref 1.7–7.7)
Neutrophils Relative %: 62 %
Platelets: 339 10*3/uL (ref 150–400)
RBC: 4.63 MIL/uL (ref 3.87–5.11)
RDW: 17.1 % — ABNORMAL HIGH (ref 11.5–15.5)
WBC: 8.7 10*3/uL (ref 4.0–10.5)
nRBC: 0 % (ref 0.0–0.2)

## 2021-01-18 LAB — LIPASE, BLOOD: Lipase: 20 U/L (ref 11–51)

## 2021-01-18 LAB — I-STAT BETA HCG BLOOD, ED (MC, WL, AP ONLY): I-stat hCG, quantitative: <5 m[IU]/mL (ref <5)

## 2021-01-18 MED ORDER — IOHEXOL 350 MG/ML SOLN
100.0000 mL | Freq: Once | INTRAVENOUS | Status: AC | PRN
  Administered 2021-01-18: 100 mL via INTRAVENOUS

## 2021-01-18 MED ORDER — HYDROCODONE-ACETAMINOPHEN 5-325 MG PO TABS
2.0000 | ORAL_TABLET | Freq: Once | ORAL | Status: AC
  Administered 2021-01-18: 2 via ORAL
  Filled 2021-01-18: qty 2

## 2021-01-18 NOTE — ED Triage Notes (Signed)
C/o abd. Pain x 3 days states she has an umbilical hernia that is hurting

## 2021-01-18 NOTE — ED Provider Notes (Signed)
[facility] EMERGENCY DEPARTMENT Provider Note   CSN: [id number] Arrival date & time: 01/18/21  7341     History Chief Complaint  Patient presents with   Abdominal Pain    Victoria Chavez is a 40 y.o. female.  HPI  40 year old female with past medical history of abdominal wall hernia status postrepair presents the emergency department with abdominal wall swelling.  Patient states this started a little over a week ago but has progressively gotten bigger, and is now tender to touch.  Denies any nausea/vomiting or change in bowel habits.  No recent fever or acute illness.  Past Medical History:  Diagnosis Date   Abdominal hernia    Morbid obesity [facility])     Patient Active Problem List   Diagnosis Date Noted   Gonorrhea 10/28/2019   Ileus (HCC) 10/27/2019   Pelvic abscess in female 10/26/2019   Trichimoniasis 10/26/2019   Anemia 10/26/2019   Incarcerated umbilical hernia 08/12/2019   Morbid obesity (HCC) 02/18/2017   Umbilical hernia 02/18/2017   Tobacco user 02/18/2017    Past Surgical History:  Procedure Laterality Date   HERNIA REPAIR     INCISIONAL HERNIA REPAIR N/A 08/12/2019   Procedure: Recurrent incisional hernia repair;  Surgeon: Axel Filler, MD;  Location: [facility] OR;  Service: General;  Laterality: N/A;   LAPAROTOMY N/A 08/12/2019   Procedure: EXPLORATORY LAPAROTOMY;  Surgeon: Axel Filler, MD;  Location: [facility] OR;  Service: General;  Laterality: N/A;   OMENTECTOMY N/A 08/12/2019   Procedure: Partial Omentectomy;  Surgeon: Axel Filler, MD;  Location: [facility] OR;  Service: General;  Laterality: N/A;     OB History     Gravida  0   Para  0   Term  0   Preterm  0   AB  0   Living  0      SAB  0   IAB  0   Ectopic  0   Multiple  0   Live Births  0           No family history on file.  Social History   Tobacco Use   Smoking status: Former    Packs/day: 0.50    Types: Cigarettes   Smokeless tobacco: Never   Substance Use Topics   Alcohol use: Yes   Drug use: Yes    Frequency: 14.0 times per week    Types: Marijuana    Home Medications Prior to Admission medications   Medication Sig Start Date End Date Taking? Authorizing Provider  albuterol (PROVENTIL HFA;VENTOLIN HFA) 108 (90 BASE) MCG/ACT inhaler Inhale 2 puffs into the lungs every 6 (six) hours as needed. For shortness of breath    [provider]  amLODipine (NORVASC) 5 MG tablet Take 1 tablet (5 mg total) by mouth daily. 10/29/19   [place] Bing, MD  amoxicillin-clavulanate (AUGMENTIN) 875-125 MG tablet Take 1 tablet by mouth every 12 (twelve) hours. 02/29/20   Khatri, Hina, PA-C  HYDROcodone-acetaminophen (NORCO/VICODIN) 5-325 MG tablet Take 1 tablet by mouth every 6 (six) hours as needed. Patient taking differently: Take 1 tablet by mouth every 6 (six) hours as needed for moderate pain.  02/29/20   Khatri, Hina, PA-C  ibuprofen (ADVIL,MOTRIN) 200 MG tablet Take 800 mg by mouth every 6 (six) hours as needed for moderate pain. For pain     [provider]  ondansetron (ZOFRAN) 4 MG tablet Take 1 tablet (4 mg total) by mouth every 6 (six) hours as needed for nausea.  10/29/19   [place] Bing, MD  polyethylene glycol (MIRALAX / GLYCOLAX) 17 g packet Take 17 g by mouth daily as needed. 10/29/19    Bing, MD    Allergies    Patient has no known allergies.  Review of Systems   Review of Systems  Constitutional:  Negative for appetite change, chills and fever.  HENT:  Negative for congestion.   Eyes:  Negative for visual disturbance.  Respiratory:  Negative for shortness of breath.   Cardiovascular:  Negative for chest pain.  Gastrointestinal:  Positive for abdominal pain. Negative for abdominal distention, blood in stool, constipation, diarrhea, nausea and vomiting.  Genitourinary:  Negative for dysuria.  Skin:  Negative for rash.  Neurological:  Negative for headaches.   Physical Exam Updated Vital  Signs BP (!) 156/92   Pulse 71   Temp 97.8 F (36.6 C)   Resp 16   Ht 5\' 7"  (1.702 m)   Wt (!) 145.2 kg   SpO2 98%   BMI 50.12 kg/m   Physical Exam Vitals and nursing note reviewed.  Constitutional:      Appearance: Normal appearance.  HENT:     Head: Normocephalic.     Mouth/Throat:     Mouth: Mucous membranes are moist.  Cardiovascular:     Rate and Rhythm: Normal rate.  Pulmonary:     Effort: Pulmonary effort is normal. No respiratory distress.  Abdominal:     Palpations: Abdomen is soft.     Tenderness: There is no abdominal tenderness.     Comments: Two separate palpable, soft reducible midline abdominal hernias, no overlying skin changes, normal bowel sounds  Skin:    General: Skin is warm.  Neurological:     Mental Status: She is alert and oriented to person, place, and time. Mental status is at baseline.  Psychiatric:        Mood and Affect: Mood normal.    ED Results / Procedures / Treatments   Labs (all labs ordered are listed, but only abnormal results are displayed) Labs Reviewed  COMPREHENSIVE METABOLIC PANEL - Abnormal; Notable for the following components:      Result Value   Sodium 134 (*)    Potassium 3.4 (*)    Glucose, Bld 111 (*)    Calcium 8.7 (*)    Albumin 3.4 (*)    All other components within normal limits  CBC WITH DIFFERENTIAL/PLATELET - Abnormal; Notable for the following components:   Hemoglobin 10.1 (*)    HCT 33.6 (*)    MCV 72.6 (*)    MCH 21.8 (*)    RDW 17.1 (*)    All other components within normal limits  URINALYSIS, ROUTINE W REFLEX MICROSCOPIC - Abnormal; Notable for the following components:   Leukocytes,Ua SMALL (*)    All other components within normal limits  URINALYSIS, MICROSCOPIC (REFLEX) - Abnormal; Notable for the following components:   Bacteria, UA RARE (*)    All other components within normal limits  LIPASE, BLOOD  I-STAT BETA HCG BLOOD, ED ([facility], [facility], [facility] ONLY)    EKG None  Radiology CT ABDOMEN PELVIS  W CONTRAST  Result Date: 01/18/2021 CLINICAL DATA:  Abdominal pain, hernia suspected EXAM: CT ABDOMEN AND PELVIS WITH CONTRAST TECHNIQUE: Multidetector CT imaging of the abdomen and pelvis was performed using the standard protocol following bolus administration of intravenous contrast. CONTRAST:  01/20/2021 OMNIPAQUE IOHEXOL 350 MG/ML SOLN COMPARISON:  June 2021 FINDINGS: Inferior chest: The lung bases are well-aerated. Hepatobiliary: The liver is  normal in size without focal abnormality. No intrahepatic or extrahepatic biliary ductal dilation. Hyperdense material in the gallbladder, likely gallstones. Spleen: Normal in size without focal abnormality. Pancreas: No pancreatic ductal dilatation or surrounding inflammatory changes. Adrenals/Urinary Tract: Adrenal glands are unremarkable. The right kidney is normal. The left kidney demonstrates a small simple renal cyst. There is nonobstructing left nephrolithiasis in the lower pole. No hydronephrosis. Bladder is unremarkable. Stomach/Bowel: The stomach, small bowel and large bowel are normal in caliber without abnormal wall thickening or surrounding inflammatory changes. There are 2 anterior midline abdominal fascial defects resulting in 2 separate hernias. The upper, smaller defect contains a loop of large bowel which is normal in caliber without abnormal wall thickening. The lower abdominal defect contains multiple loops of normal-appearing small bowel, also without abnormal wall thickening or dilation. There is a small amount of ascites within this hernia. No intraperitoneal free air. Reproductive: Uterus and bilateral adnexa are unremarkable. Lymphatic: No enlarged lymph nodes in the abdomen or pelvis. Vasculature: The abdominal aorta is normal in caliber. The portal venous system is patent. Musculoskeletal: No aggressive osseous lesions. The soft tissues are unremarkable. IMPRESSION: 1. Interval development of 2 separate anterior midline abdominal fascial defects  resulting in 2 separate hernias. The smaller, superior hernia contains a loop of large bowel. The larger, inferior hernia contains multiple loops of small bowel. The loops of bowel remain normal in caliber without abnormal wall thickening or signs to suggest strangulation. See representative key image (series 7, image 93). 2. Cholelithiasis. 3. Nonobstructive left nephrolithiasis. Electronically Signed   By: Olive Bass M.D.   On: 01/18/2021 08:46    Procedures Procedures   Medications Ordered in ED Medications  HYDROcodone-acetaminophen (NORCO/VICODIN) 5-325 MG per tablet 2 tablet (2 tablets Oral Given 01/18/21 0701)  iohexol (OMNIPAQUE) 350 MG/ML injection 100 mL (100 mLs Intravenous Contrast Given 01/18/21 1324)    ED Course  I have reviewed the triage vital signs and the nursing notes.  Pertinent labs & imaging results that were available during my care of the patient were reviewed by me and considered in my medical decision making (see chart for details).    MDM Rules/Calculators/A&P                           40 year old female presents to the emergency department with abdominal swelling and pain.  Vitals are stable on arrival, abdomen is soft.  No obstructive symptoms.  She does have 2 separate midline abdominal hernias that are soft, reducible, no overlying skin changes.  Blood work is baseline, CT identifies 2 separate midline abdominal wall hernias with no signs of incarceration or obstruction.  Patient's been able to tolerate p.o.  Plan for outpatient follow-up with surgery for elective repair.  Patient at this time appears safe and stable for discharge and will be treated as an outpatient.  Discharge plan and strict return to ED precautions discussed, patient verbalizes understanding and agreement.  Final Clinical Impression(s) / ED Diagnoses Final diagnoses:  Hernia of abdominal wall    Rx / DC Orders ED Discharge Orders     None        Rozelle Logan,  DO 01/18/21 1345

## 2021-01-18 NOTE — ED Provider Notes (Addendum)
Emergency Medicine Provider Triage Evaluation Note  Victoria Chavez , a 40 y.o. female  was evaluated in triage.  Pt complains of umbilical hernia pain.  States that hernia is larger, harder, and more painful.  Denies fever.  Reports decreased BMs.  Review of Systems  Positive: Abdominal pain, constipation Negative: Fever, chills  Physical Exam  BP (!) 147/88 (BP Location: Left Arm)   Pulse 79   Temp 98.8 F (37.1 C) (Oral)   Resp 15   SpO2 98%  Gen:   Awake, no distress   Resp:  Normal effort  MSK:   Moves extremities without difficulty  Other:  Umbilical hernia, TTP, no erythema  Medical Decision Making  Medically screening exam initiated at 6:39 AM.  Appropriate orders placed.  Kriste Rilie Glanz was informed that the remainder of the evaluation will be completed by another provider, this initial triage assessment does not replace that evaluation, and the importance of remaining in the ED until their evaluation is complete.  Abdominal pain   Roxy Horseman, PA-C 01/18/21 0641    Roxy Horseman, PA-C 01/18/21 2111    Gilda Crease, MD 01/19/21 [phone]

## 2021-01-18 NOTE — Discharge Instructions (Signed)
You have been seen and discharged from the emergency department.  Your CAT scan showed 2 separate hernias without complication.  Follow-up with general surgery for elective repair.  Eat a high-fiber diet and take stool softeners to avoid heavy straining when having a bowel movement.  Avoid any activity that could cause further injury to the area.  Take home medications as prescribed. If you have any worsening symptoms, worsening abdominal swelling, severe pain, high fevers or further concerns for your health please return to an emergency department for further evaluation.

## 2021-04-17 IMAGING — CT CT ABD-PELV W/ CM
1 series · 15 of 32 positions shown, 19 images · IV contrast (omnipaque)
Comparison: CT scan dated 08/12/2019

CLINICAL DATA: Constipation. Abdominal surgery on 08/12/2019 for
repair of incisional hernia.

EXAM:
CT ABDOMEN AND PELVIS WITH CONTRAST
TECHNIQUE: Multidetector CT imaging of the abdomen and pelvis was performed
using the standard protocol following bolus administration of
intravenous contrast.
CONTRAST:  100mL OMNIPAQUE IOHEXOL 300 MG/ML  SOLN

[Series 3: abd/ pelvis 5.0 i30f 2 · axial · 0.98mm/px · z∈[+703,+1188]mm · 15 of 108 slices shown, 19 images]
[im 7/108  soft-tissue]
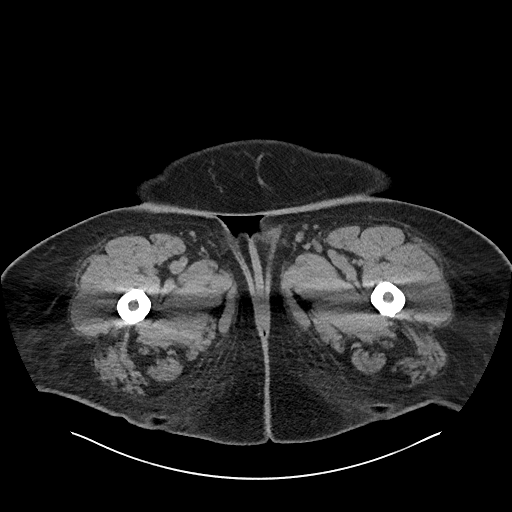
[im 7/108  bone]
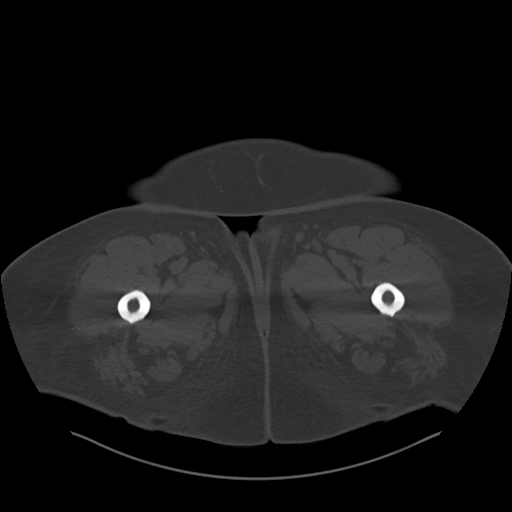
[im 14/108  soft-tissue]
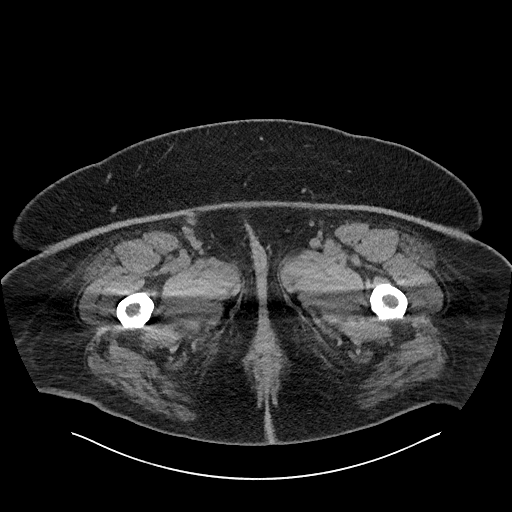
[im 21/108  soft-tissue]
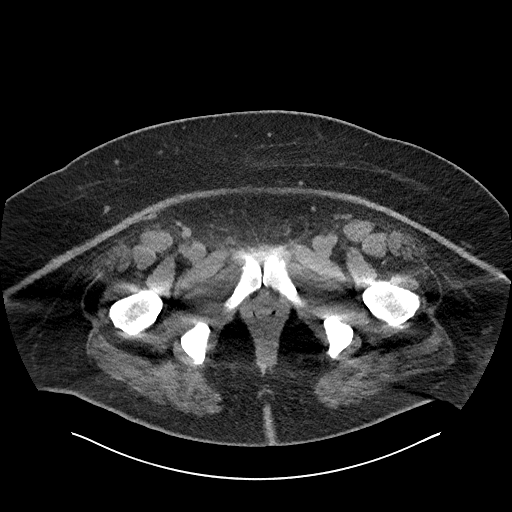
[im 32/108  soft-tissue]
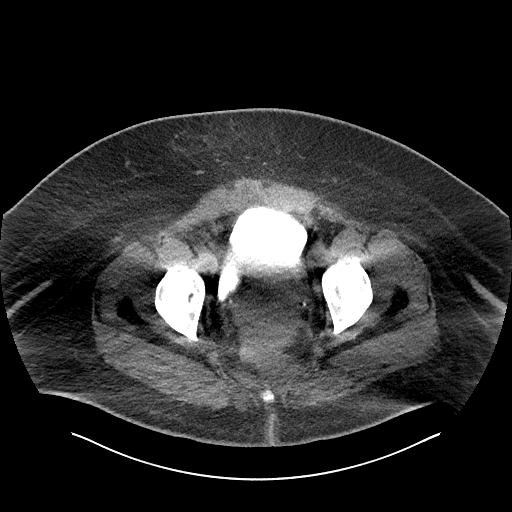
[im 38/108  soft-tissue]
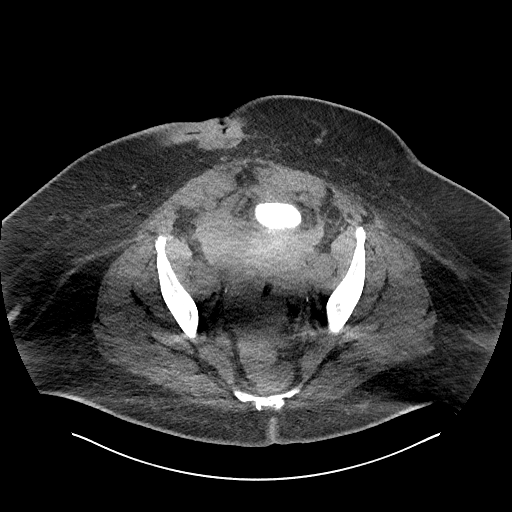
[im 45/108  soft-tissue]
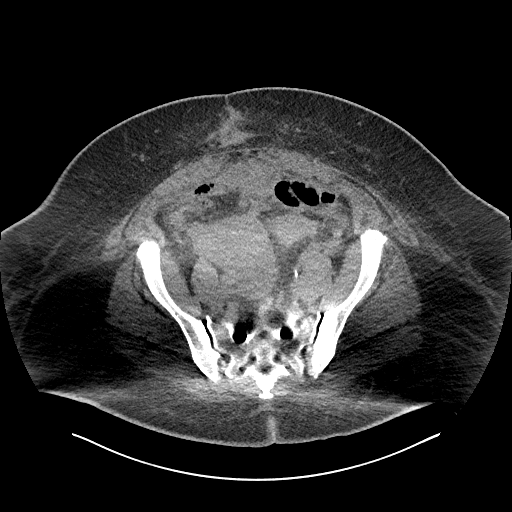
[im 56/108  soft-tissue]
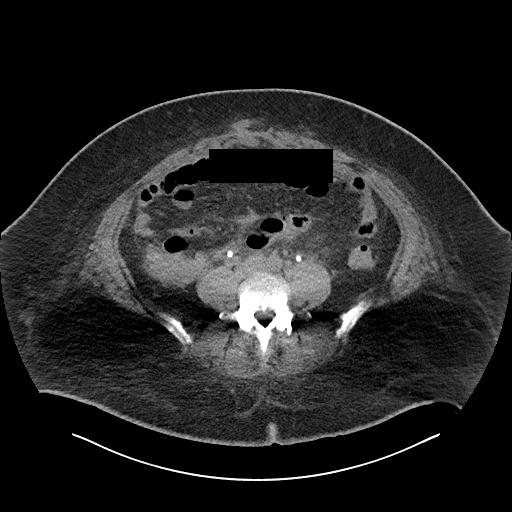
[im 63/108  soft-tissue]
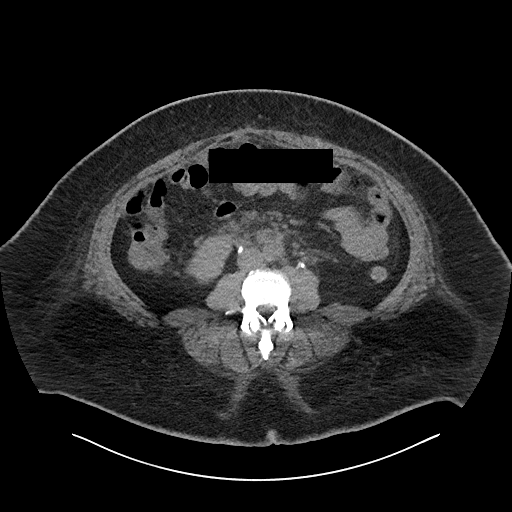
[im 70/108  soft-tissue]
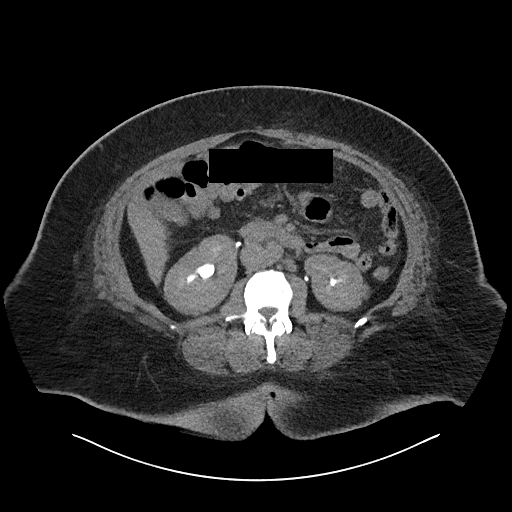
[im 70/108  bone]
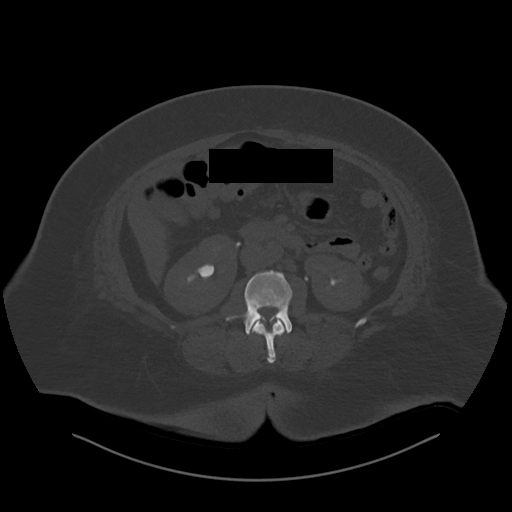
[im 76/108  soft-tissue]
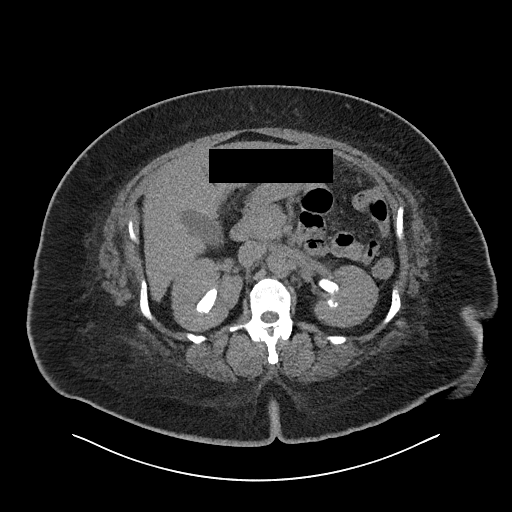
[im 87/108  soft-tissue]
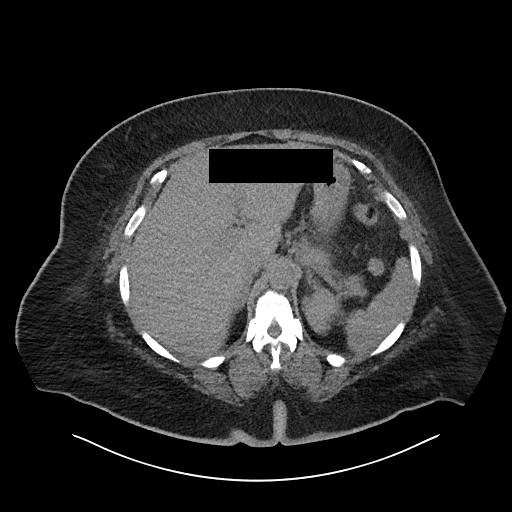
[im 94/108  soft-tissue]
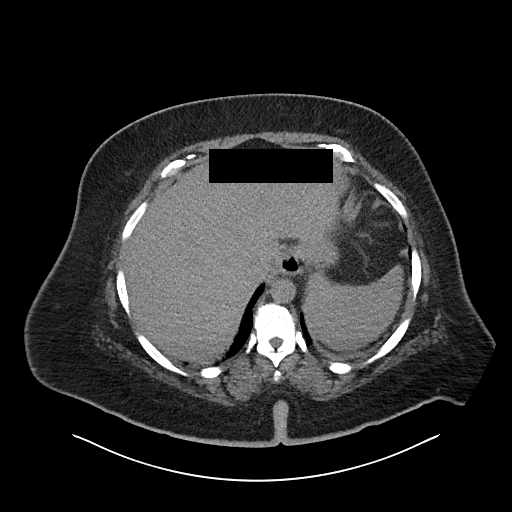
[im 94/108  lung]
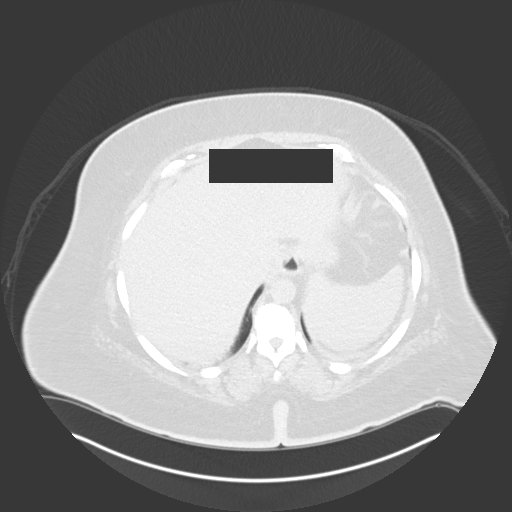
[im 97/108  lung]
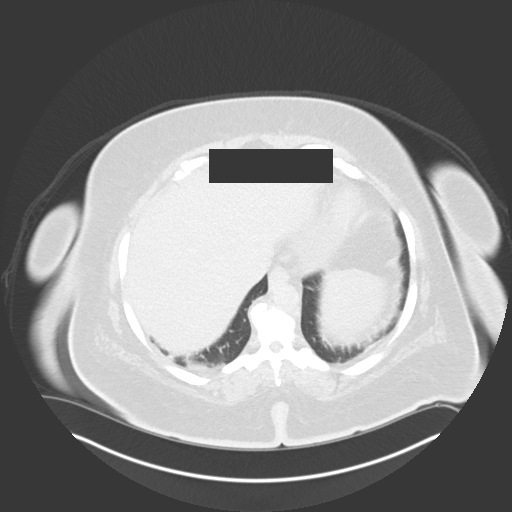
[im 101/108  soft-tissue]
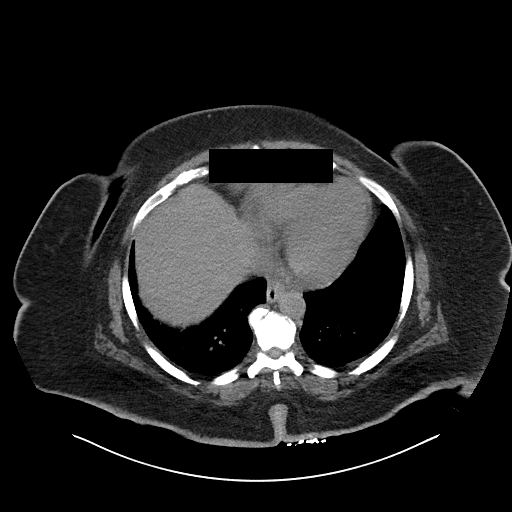
[im 101/108  lung]
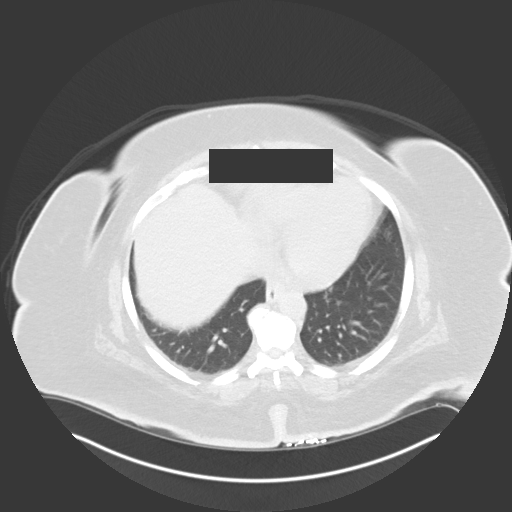
[im 104/108  lung]
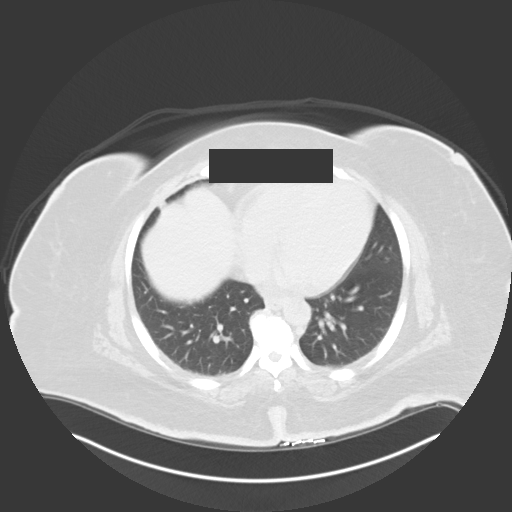

[15 of 32 positions shown; findings below may reference images not displayed]

FINDINGS: Lower chest: No significant abnormalities.

Hepatobiliary: Hepatic parenchyma is normal. Multiple gallstones. No
thickening of the gallbladder wall. No gallbladder distention. No
bile duct dilatation.

Pancreas: Unremarkable. No pancreatic ductal dilatation or
surrounding inflammatory changes.

Spleen: Normal in size without focal abnormality.

Adrenals/Urinary Tract: Multiple small chronic stones in the mid
left kidney. 16 mm benign-appearing exophytic cyst in the mid left
kidney. Right kidney appears normal. Adrenal glands are normal. No
hydronephrosis. Delayed images demonstrate a normal appearing
bladder.

Stomach/Bowel: Small hiatal hernia. The stomach is otherwise normal.
Interval repair of the ventral hernia. No dilated large or small
bowel. The appendix is not discretely identified.

Vascular/Lymphatic: No significant vascular findings are present. No
enlarged abdominal or pelvic lymph nodes.

Reproductive: There are multiple new septated fluid collections in
the pelvis surrounding the uterus and ovaries, likely representing a
complex pelvic abscess. This obscures the detail of the ovaries and
uterus.

Other: Small amount of fluid and air along the surgical incision for
the ventral hernia repair. This could represent a small abscess,
best seen on image 79 of series 3.

No free air in the abdomen.

Musculoskeletal: No acute or significant osseous findings.
IMPRESSION: 1. Multiple new septated fluid collections in the pelvis surrounding
the uterus and ovaries, likely representing a complex pelvic
abscess.
2. Small amount of fluid and air along the surgical incision for the
ventral hernia repair. This could represent a small abscess.
3. Interval repair of the ventral hernia.
4. Chronic cholelithiasis.
5. Multiple small chronic stones in the mid left kidney.

## 2021-04-18 IMAGING — DX DG ABDOMEN 2V
3 series · 3 of 3 positions shown · non-contrast
Comparison: None.

CLINICAL DATA: Nausea and vomiting

EXAM:
ABDOMEN - 2 VIEW

[abdomen erect]
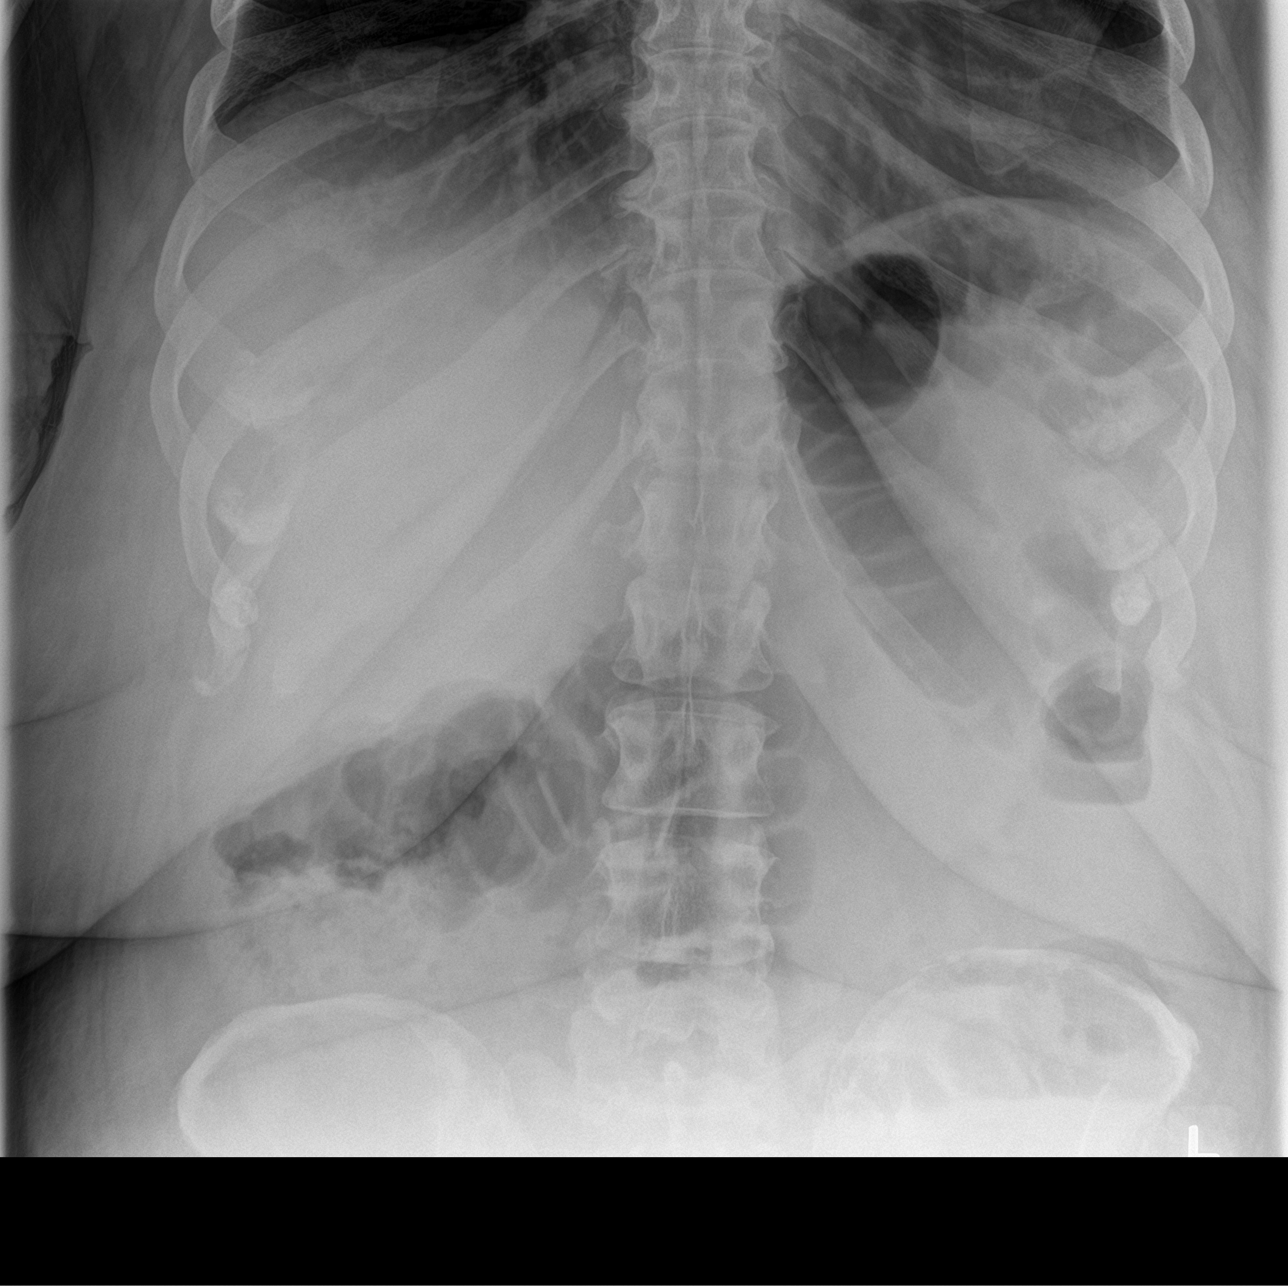

[abdomen supine (1 of 2)]
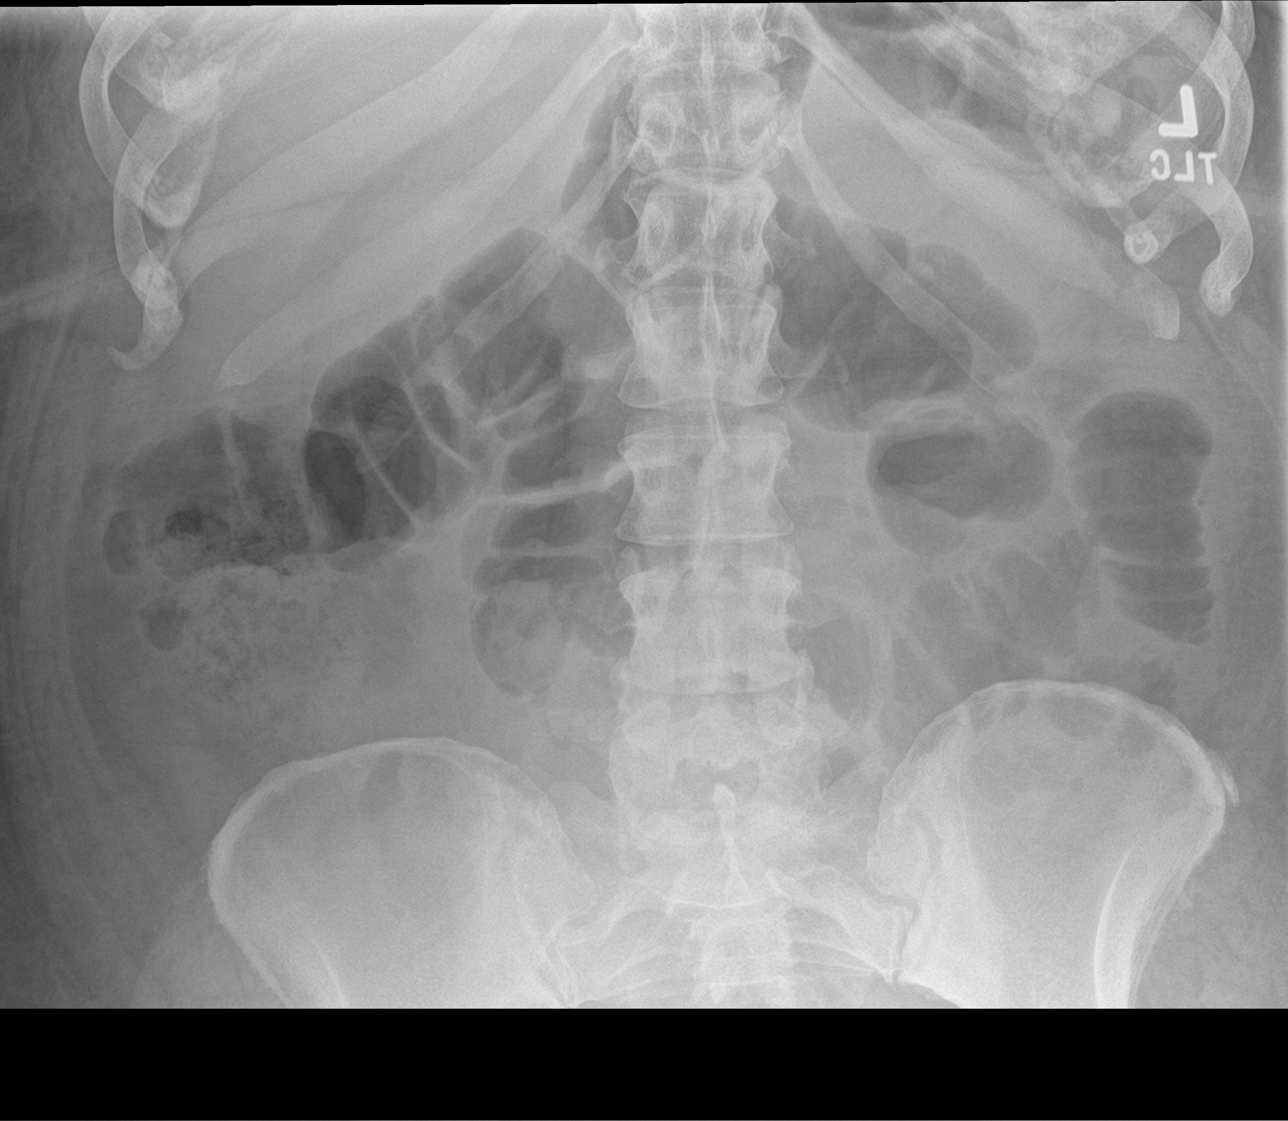

[abdomen supine (2 of 2)]
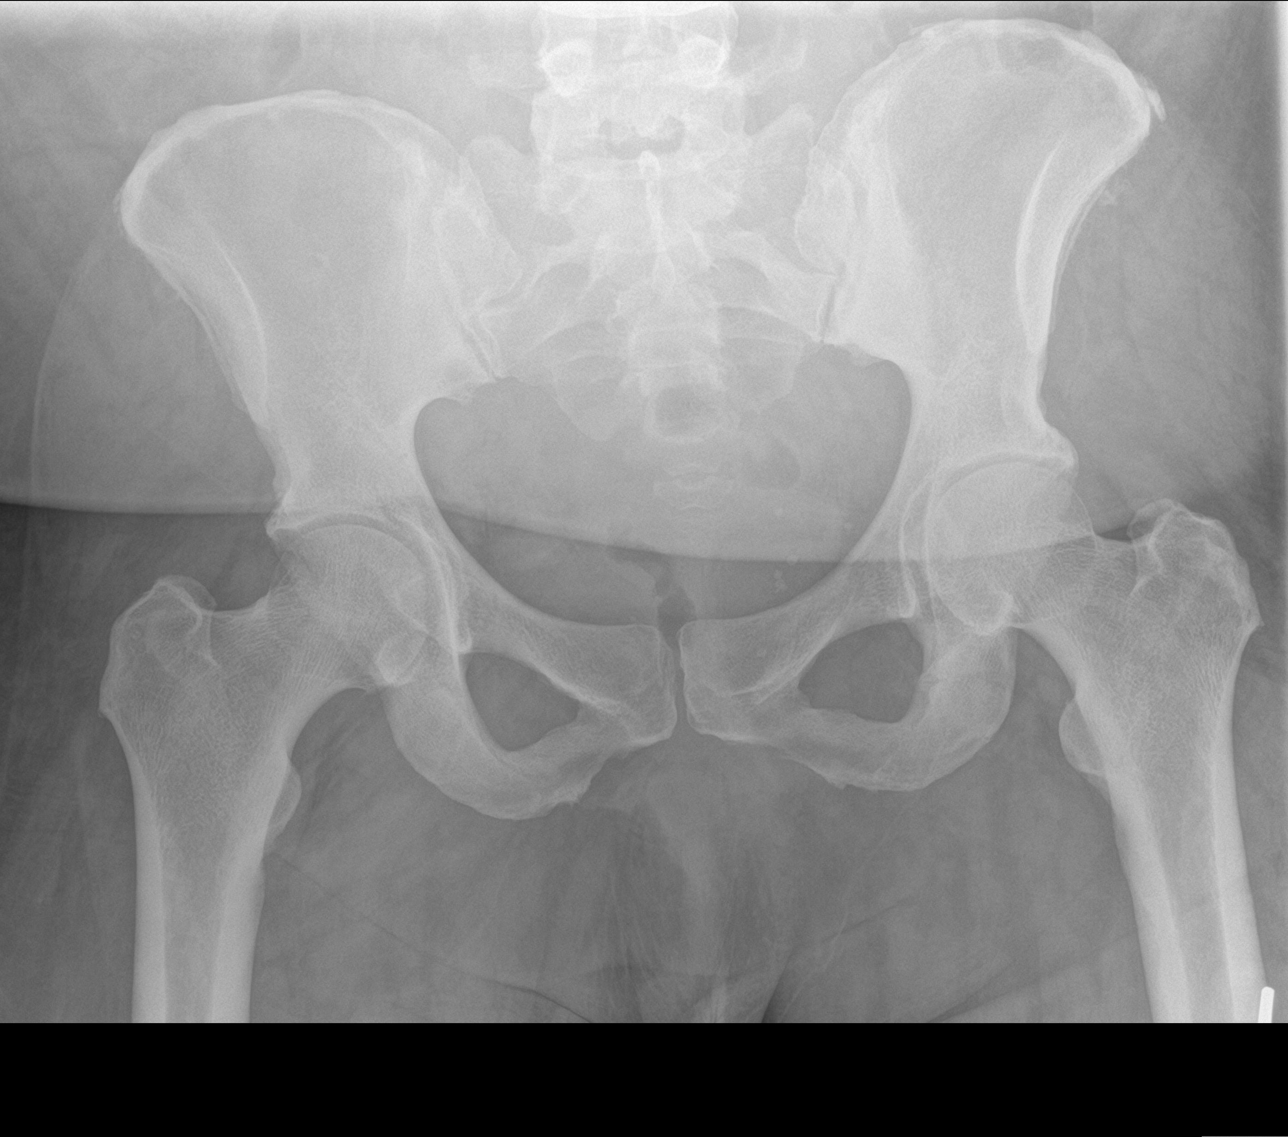

[3 of 3 positions shown; findings below may reference images not displayed]

FINDINGS: Scattered large and small bowel gas is noted. Mild dilatation of the
small bowel in the left mid abdomen is seen which may represent an
ileus or early partial small bowel obstruction. Correlation with the
physical exam is recommended. No bony abnormality is noted.
IMPRESSION: Mildly dilated loops of small bowel in the left abdomen. This may
represent an early ileus or partial small bowel obstruction.

## 2023-04-02 ENCOUNTER — Encounter (HOSPITAL_COMMUNITY): Payer: Self-pay

## 2023-04-02 ENCOUNTER — Ambulatory Visit (HOSPITAL_COMMUNITY)
Admission: EM | Admit: 2023-04-02 | Discharge: 2023-04-02 | Disposition: A | Discharge status: Home / Self Care | Payer: Commercial Managed Care - HMO | Attending: Sports Medicine | Admitting: Sports Medicine

## 2023-04-02 DIAGNOSIS — M545 Low back pain, unspecified: Secondary | ICD-10-CM | POA: Diagnosis not present

## 2023-04-02 MED ORDER — TIZANIDINE HCL 4 MG PO TABS: 4.0000 mg | ORAL_TABLET | Freq: Four times a day (QID) | ORAL | 0 refills | Status: DC | PRN

## 2023-04-02 MED ORDER — MELOXICAM 7.5 MG PO TABS: 15.0000 mg | ORAL_TABLET | Freq: Every day | ORAL | 0 refills | Status: DC

## 2023-04-02 NOTE — ED Provider Notes (Signed)
[facility]    CSN: [id number] Arrival date & time: 04/02/23  1001      History   Chief Complaint Chief Complaint  Patient presents with   Back Pain    HPI Victoria Chavez is a 42 y.o. female here with complaint of right sided back pain of 3 days duration. She denies previous issues with her back. She states she fell over Thanksgiving weekend and has since been having right sided back pain. Worse with prolonged sitting or twisting. Denies radiation of pain. Denies incontinence or lower extremity weakness/sensory changes. She has tried heating pads, tylenol, and ibuprofen without relief.  Has h/o abdominal hernia s/p 2 surgeries though denies abdominal pain, stool changes, N/V, fevers/chills, dysuria or hematuria.    Back Pain   Past Medical History:  Diagnosis Date   Abdominal hernia    Morbid obesity [facility])     Patient Active Problem List   Diagnosis Date Noted   Gonorrhea 10/28/2019   Ileus (HCC) 10/27/2019   Pelvic abscess in female 10/26/2019   Trichomoniasis 10/26/2019   Anemia 10/26/2019   Incarcerated umbilical hernia 08/12/2019   Morbid obesity (HCC) 02/18/2017   Umbilical hernia 02/18/2017   Tobacco user 02/18/2017    Past Surgical History:  Procedure Laterality Date   HERNIA REPAIR     INCISIONAL HERNIA REPAIR N/A 08/12/2019   Procedure: Recurrent incisional hernia repair;  Surgeon: Axel Filler, MD;  Location: [facility] OR;  Service: General;  Laterality: N/A;   LAPAROTOMY N/A 08/12/2019   Procedure: EXPLORATORY LAPAROTOMY;  Surgeon: Axel Filler, MD;  Location: [facility] OR;  Service: General;  Laterality: N/A;   OMENTECTOMY N/A 08/12/2019   Procedure: Partial Omentectomy;  Surgeon: Axel Filler, MD;  Location: [facility] OR;  Service: General;  Laterality: N/A;    OB History     Gravida  0   Para  0   Term  0   Preterm  0   AB  0   Living  0      SAB  0   IAB  0   Ectopic  0   Multiple  0   Live Births  0             Home Medications    Prior to Admission medications   Medication Sig Start Date End Date Taking? Authorizing Provider  meloxicam (MOBIC) 7.5 MG tablet Take 2 tablets (15 mg total) by mouth daily. 04/02/23  Yes Marisa Cyphers, MD  spironolactone (ALDACTONE) 25 MG tablet Take 25 mg by mouth daily. 03/13/23  Yes [provider]  tiZANidine (ZANAFLEX) 4 MG tablet Take 1 tablet (4 mg total) by mouth every 6 (six) hours as needed for muscle spasms. 04/02/23  Yes Marisa Cyphers, MD  albuterol (PROVENTIL HFA;VENTOLIN HFA) 108 (90 BASE) MCG/ACT inhaler Inhale 2 puffs into the lungs every 6 (six) hours as needed. For shortness of breath    [provider]  amLODipine (NORVASC) 5 MG tablet Take 1 tablet (5 mg total) by mouth daily. 10/29/19   Micco Bing, MD    Family History History reviewed. No pertinent family history.  Social History Social History   Tobacco Use   Smoking status: Former    Current packs/day: 0.50    Types: Cigarettes   Smokeless tobacco: Never  Substance Use Topics   Alcohol use: Yes   Drug use: Yes    Frequency: 14.0 times per week    Types: Marijuana     Allergies  Patient has no known allergies.   Review of Systems Review of Systems  Musculoskeletal:  Positive for back pain.     Physical Exam Triage Vital Signs ED Triage Vitals  Encounter Vitals Group     BP 04/02/23 1020 (!) 158/80     Systolic BP Percentile --      Diastolic BP Percentile --      Pulse Rate 04/02/23 1020 91     Resp 04/02/23 1020 16     Temp 04/02/23 1020 98.3 F (36.8 C)     Temp Source 04/02/23 1020 Oral     SpO2 04/02/23 1020 98 %     Weight 04/02/23 1020 (!) 333 lb (151 kg)     Height 04/02/23 1020 5\' 7"  (1.702 m)     Head Circumference --      Peak Flow --      Pain Score 04/02/23 1018 10     Pain Loc --      Pain Education --      Exclude from Growth Chart --    No data found.  Updated Vital Signs BP (!) 158/80 (BP Location: Left  Arm)   Pulse 91   Temp 98.3 F (36.8 C) (Oral)   Resp 16   Ht 5\' 7"  (1.702 m)   Wt (!) 151 kg   LMP 03/15/2023 (Exact Date)   SpO2 98%   BMI 52.16 kg/m   Visual Acuity Right Eye Distance:   Left Eye Distance:   Bilateral Distance:    Right Eye Near:   Left Eye Near:    Bilateral Near:     Physical Exam Constitutional:      General: She is not in acute distress.    Appearance: She is obese. She is not ill-appearing or diaphoretic.  HENT:     Head: Normocephalic and atraumatic.  Cardiovascular:     Rate and Rhythm: Normal rate and regular rhythm.     Pulses: Normal pulses.  Pulmonary:     Effort: Pulmonary effort is normal.     Breath sounds: Normal breath sounds.  Abdominal:     General: There is no distension.     Palpations: Abdomen is soft.     Tenderness: There is no abdominal tenderness. There is no right CVA tenderness, left CVA tenderness or guarding.     Hernia: A hernia is present.  Musculoskeletal:     Comments: Low Back: No gross deformity. Point tenderness in right lateral low back muscles. No midline or bony TTP. FROM though pain with flexion and rotation. Hips with painless full ROM. Strength LEs 5/5 all muscle groups in BLEs.   2+ MSRs in patellar and achilles tendons, equal bilaterally. Negative SLRs. Sensation intact to light touch bilaterally.    Skin:    General: Skin is warm and dry.  Neurological:     Mental Status: She is alert.      UC Treatments / Results  Labs (all labs ordered are listed, but only abnormal results are displayed) Labs Reviewed - No data to display  EKG   Radiology No results found.  Procedures Procedures (including critical care time)  Medications Ordered in UC Medications - No data to display  Initial Impression / Assessment and Plan / UC Course  I have reviewed the triage vital signs and the nursing notes.  Pertinent labs & imaging results that were available during my care of the patient were  reviewed by me and considered in my medical decision making (  see chart for details).    Vitals and triage reviewed, patient is hemodynamically stable.  Based on her exam and history I think this is likely muscular low back pain.  No red flag symptoms based on history or exam.  We discussed that this will likely improve with time as well as stretching therapy exercises.  I did provide her home exercise program.  Recommended she take Tylenol 1000 mg 3 times a day, meloxicam prescription sent, as well as tizanidine for as needed muscle relaxer use.  Recommended she avoid ibuprofen with the meloxicam and operation of heavy machinery while on tizanidine.  She has follow-up with her primary care doctor next week and I advised that if this is not starting to improve she would likely benefit with referral to formal physical therapy at that time.  Patient's questions were answered and she is agree with this treatment plan.  Final Clinical Impressions(s) / UC Diagnoses   Final diagnoses:  Acute right-sided low back pain without sciatica     Discharge Instructions      You have muscular back in your low back. I recommend using heating pad and doing the home stretches I provided you today. I also recommend taking tylenol 1000mg  every 8 hours, Meloxicam 15mg  once daily (avoid taking ibuprofen on this), and as needed Tizanidine up to every 8 hours (caution, this medication may make you sleepy so avoid taking early in the day or before driving). If your pain is persistent next week, I recommend referral to PT through your primary care doctor.   ED Prescriptions     Medication Sig Dispense Auth. Provider   tiZANidine (ZANAFLEX) 4 MG tablet Take 1 tablet (4 mg total) by mouth every 6 (six) hours as needed for muscle spasms. 30 tablet Marisa Cyphers, MD   meloxicam (MOBIC) 7.5 MG tablet Take 2 tablets (15 mg total) by mouth daily. 60 tablet Marisa Cyphers, MD      PDMP not reviewed this  encounter.   Marisa Cyphers, MD 04/02/23 1052

## 2023-04-02 NOTE — ED Triage Notes (Signed)
Patient here today with c/o right side lower back pain X 3 days. She has been taking a Dual Acetaminophen and IBU with no relief. No h/o back pain.

## 2023-04-02 NOTE — Discharge Instructions (Signed)
You have muscular back in your low back. I recommend using heating pad and doing the home stretches I provided you today. I also recommend taking tylenol 1000mg  every 8 hours, Meloxicam 15mg  once daily (avoid taking ibuprofen on this), and as needed Tizanidine up to every 8 hours (caution, this medication may make you sleepy so avoid taking early in the day or before driving). If your pain is persistent next week, I recommend referral to PT through your primary care doctor.

## 2023-04-29 ENCOUNTER — Other Ambulatory Visit: Payer: Self-pay | Admitting: Sports Medicine

## 2023-07-15 ENCOUNTER — Other Ambulatory Visit: Payer: Self-pay | Admitting: General Surgery

## 2023-07-15 DIAGNOSIS — K439 Ventral hernia without obstruction or gangrene: Secondary | ICD-10-CM

## 2023-08-06 ENCOUNTER — Encounter: Payer: Self-pay | Admitting: General Surgery

## 2023-08-06 ENCOUNTER — Other Ambulatory Visit

## 2023-08-12 ENCOUNTER — Ambulatory Visit
Admission: RE | Admit: 2023-08-12 | Discharge: 2023-08-12 | Disposition: A | Discharge status: Home / Self Care | Source: Ambulatory Visit | Attending: General Surgery | Admitting: General Surgery

## 2023-08-12 DIAGNOSIS — K439 Ventral hernia without obstruction or gangrene: Secondary | ICD-10-CM

## 2023-09-03 ENCOUNTER — Encounter: Payer: Self-pay | Admitting: Hematology

## 2023-09-03 ENCOUNTER — Inpatient Hospital Stay (HOSPITAL_BASED_OUTPATIENT_CLINIC_OR_DEPARTMENT_OTHER): Admitting: Hematology

## 2023-09-03 ENCOUNTER — Inpatient Hospital Stay: Attending: Hematology | Admitting: Hematology

## 2023-09-03 VITALS — BP 152/78 | HR 83 | Temp 98.3°F | Resp 20 | Ht 67.0 in | Wt 327.8 lb

## 2023-09-03 DIAGNOSIS — N920 Excessive and frequent menstruation with regular cycle: Secondary | ICD-10-CM | POA: Diagnosis present

## 2023-09-03 DIAGNOSIS — Z1231 Encounter for screening mammogram for malignant neoplasm of breast: Secondary | ICD-10-CM

## 2023-09-03 DIAGNOSIS — D649 Anemia, unspecified: Secondary | ICD-10-CM | POA: Diagnosis not present

## 2023-09-03 DIAGNOSIS — D500 Iron deficiency anemia secondary to blood loss (chronic): Secondary | ICD-10-CM | POA: Insufficient documentation

## 2023-09-03 DIAGNOSIS — N840 Polyp of corpus uteri: Secondary | ICD-10-CM | POA: Diagnosis not present

## 2023-09-03 DIAGNOSIS — F1290 Cannabis use, unspecified, uncomplicated: Secondary | ICD-10-CM | POA: Diagnosis not present

## 2023-09-03 LAB — CBC WITH DIFFERENTIAL/PLATELET
Abs Immature Granulocytes: 0.03 10*3/uL (ref 0.00–0.07)
Basophils Absolute: 0 10*3/uL (ref 0.0–0.1)
Basophils Relative: 0 %
Eosinophils Absolute: 0.2 10*3/uL (ref 0.0–0.5)
Eosinophils Relative: 2 %
HCT: 29.9 % — ABNORMAL LOW (ref 36.0–46.0)
Hemoglobin: 8.5 g/dL — ABNORMAL LOW (ref 12.0–15.0)
Immature Granulocytes: 0 %
Lymphocytes Relative: 26 %
Lymphs Abs: 2.5 10*3/uL (ref 0.7–4.0)
MCH: 17.5 pg — ABNORMAL LOW (ref 26.0–34.0)
MCHC: 28.4 g/dL — ABNORMAL LOW (ref 30.0–36.0)
MCV: 61.4 fL — ABNORMAL LOW (ref 80.0–100.0)
Monocytes Absolute: 1 10*3/uL (ref 0.1–1.0)
Monocytes Relative: 10 %
Neutro Abs: 5.8 10*3/uL (ref 1.7–7.7)
Neutrophils Relative %: 62 %
Platelets: 365 10*3/uL (ref 150–400)
RBC: 4.87 MIL/uL (ref 3.87–5.11)
RDW: 23.2 % — ABNORMAL HIGH (ref 11.5–15.5)
WBC: 9.5 10*3/uL (ref 4.0–10.5)
nRBC: 0 % (ref 0.0–0.2)

## 2023-09-03 LAB — VITAMIN B12: Vitamin B-12: 119 pg/mL — ABNORMAL LOW (ref 180–914)

## 2023-09-03 NOTE — Progress Notes (Signed)
 [facility]   Telephone:(336) [phone] Fax:([phone]) [phone]   Clinic New Consult Note   Patient Care Team: Pcp, No as PCP - General 09/03/2023  CHIEF COMPLAINTS/PURPOSE OF CONSULTATION:  Iron  deficient anemia  REFERRING PHYSICIAN: Constantino Demark, FNP   Discussed the use of AI scribe software for clinical note transcription with the patient, who gave verbal consent to proceed.  History of Present Illness Victoria Chavez is a 43 year old female who presents for evaluation of anemia. She was referred by her [facility] for evaluation of iron  deficient anemia.  She has a mild anemia in the past, with a hemoglobin 11.4 and MCV 73.3 in 2018.  Her anemia has been much worse in the past 4 years with a hemoglobin 7.1-10.1.  She reports noticeable heavy menstrual period for the past year.  During her recent visit with her PCP, CBC showed hemoglobin 7.9 with MCV 58.  Repeated hemoglobin was 7.7 after 1 week of oral iron .  She has been taking oral iron  supplements for two weeks without significant improvement. Her hemoglobin level is 7.7 g/dL, and ferritin is 4.1 ng/mL on 08/26/2023,  indicating iron  deficiency. She has a history of low B12 levels noted in 2021 but is not currently on B12 supplements. Her anemia has persisted for several years, with the highest recorded hemoglobin at 9.5 g/dL. During an emergency umbilical hernia surgery in July 2021, her hemoglobin was 7.1 g/dL.  Menstrual history reveals periods lasting six days, with heavy bleeding for the first three days, requiring pad changes three times a day. She uses a medication to stop the bleeding, taken three times a day for seven days, but is unsure of the name. She has a uterine polyp and has experienced heavy periods since late last year.  Family history includes anemia, as her sister mentioned it runs in the family. Her mother had cervical cancer, but there is no other family history of cancer.     MEDICAL HISTORY:   Past Medical History:  Diagnosis Date   Abdominal hernia    HTN (hypertension), benign    Morbid obesity (HCC)     SURGICAL HISTORY: Past Surgical History:  Procedure Laterality Date   HERNIA REPAIR     INCISIONAL HERNIA REPAIR N/A 08/12/2019   Procedure: Recurrent incisional hernia repair;  Surgeon: Shela Derby, MD;  Location: [facility] OR;  Service: General;  Laterality: N/A;   LAPAROTOMY N/A 08/12/2019   Procedure: EXPLORATORY LAPAROTOMY;  Surgeon: Shela Derby, MD;  Location: [facility] OR;  Service: General;  Laterality: N/A;   OMENTECTOMY N/A 08/12/2019   Procedure: Partial Omentectomy;  Surgeon: Shela Derby, MD;  Location: [facility] OR;  Service: General;  Laterality: N/A;    SOCIAL HISTORY: Social History   Socioeconomic History   Marital status: Single    Spouse name: Not on file   Number of children: 0   Years of education: Not on file   Highest education level: Not on file  Occupational History   Not on file  Tobacco Use   Smoking status: Former    Current packs/day: 0.50    Types: Cigarettes   Smokeless tobacco: Never  Substance and Sexual Activity   Alcohol use: Yes   Drug use: Yes    Frequency: 14.0 times per week    Types: Marijuana   Sexual activity: Yes    Birth control/protection: None  Other Topics Concern   Not on file  Social History Narrative   Not on file   Social  Drivers of Corporate investment banker Strain: Not on file  Food Insecurity: Not on file  Transportation Needs: Not on file  Physical Activity: Not on file  Stress: Not on file  Social Connections: Not on file  Intimate Partner Violence: Not on file    FAMILY HISTORY: History reviewed. No pertinent family history.  ALLERGIES:  has no known allergies.  MEDICATIONS:  Current Outpatient Medications  Medication Sig Dispense Refill   albuterol  (PROVENTIL  HFA;VENTOLIN  HFA) 108 (90 BASE) MCG/ACT inhaler Inhale 2 puffs into the lungs every 6 (six) hours as needed. For shortness of  breath     amLODipine  (NORVASC ) 5 MG tablet Take 1 tablet (5 mg total) by mouth daily. 90 tablet 1   meloxicam  (MOBIC ) 7.5 MG tablet Take 2 tablets (15 mg total) by mouth daily. 60 tablet 0   spironolactone (ALDACTONE) 25 MG tablet Take 25 mg by mouth daily.     tiZANidine  (ZANAFLEX ) 4 MG tablet Take 1 tablet (4 mg total) by mouth every 6 (six) hours as needed for muscle spasms. 30 tablet 0   No current facility-administered medications for this visit.    REVIEW OF SYSTEMS:   Constitutional: Denies fevers, chills or abnormal night sweats Eyes: Denies blurriness of vision, double vision or watery eyes Ears, nose, mouth, throat, and face: Denies mucositis or sore throat Respiratory: Denies cough, dyspnea or wheezes Cardiovascular: Denies palpitation, chest discomfort or lower extremity swelling Gastrointestinal:  Denies nausea, heartburn or change in bowel habits Skin: Denies abnormal skin rashes Lymphatics: Denies new lymphadenopathy or easy bruising Neurological:Denies numbness, tingling or new weaknesses Behavioral/Psych: Mood is stable, no new changes  All other systems were reviewed with the patient and are negative.  PHYSICAL EXAMINATION: ECOG PERFORMANCE STATUS: 1 - Symptomatic but completely ambulatory  Vitals:   09/03/23 1439  BP: (!) 152/78  Pulse: 83  Resp: 20  Temp: 98.3 F (36.8 C)  SpO2: 100%   Filed Weights   09/03/23 1439  Weight: (!) 327 lb 12.8 oz (148.7 kg)    GENERAL:alert, no distress and comfortable SKIN: skin color, texture, turgor are normal, no rashes or significant lesions EYES: normal, conjunctiva are pink and non-injected, sclera clear OROPHARYNX:no exudate, no erythema and lips, buccal mucosa, and tongue normal  NECK: supple, thyroid normal size, non-tender, without nodularity LYMPH:  no palpable lymphadenopathy in the cervical, axillary or inguinal LUNGS: clear to auscultation and percussion with normal breathing effort HEART: regular rate &  rhythm and no murmurs and no lower extremity edema ABDOMEN:abdomen soft, non-tender and normal bowel sounds Musculoskeletal:no cyanosis of digits and no clubbing  PSYCH: alert & oriented x 3 with fluent speech NEURO: no focal motor/sensory deficits  Physical Exam    LABORATORY DATA:  I have reviewed the data as listed    Latest Ref Rng & Units 09/03/2023    3:51 PM 01/18/2021    6:42 AM 10/27/2019    4:28 AM  CBC  WBC 4.0 - 10.5 K/uL 9.5  8.7  9.7   Hemoglobin 12.0 - 15.0 g/dL 8.5  14.7  7.1   Hematocrit 36.0 - 46.0 % 29.9  33.6  25.4   Platelets 150 - 400 K/uL 365  339  592     @cmpl @  RADIOGRAPHIC STUDIES: I have personally reviewed the radiological images as listed and agreed with the findings in the report. CT ABDOMEN PELVIS WO CONTRAST Result Date: 08/23/2023 CLINICAL DATA:  Ventral hernia EXAM: CT ABDOMEN AND PELVIS WITHOUT CONTRAST TECHNIQUE: Multidetector CT  imaging of the abdomen and pelvis was performed following the standard protocol without IV contrast. RADIATION DOSE REDUCTION: This exam was performed according to the departmental dose-optimization program which includes automated exposure control, adjustment of the mA and/or kV according to patient size and/or use of iterative reconstruction technique. COMPARISON:  CT January 16, 2021 FINDINGS: Lower chest: No acute abnormality. Hepatobiliary: Unremarkable noncontrast enhanced appearance of the hepatic parenchyma. Cholelithiasis without findings of acute cholecystitis. No biliary ductal dilation. Pancreas: No pancreatic ductal dilation or evidence of acute inflammation. Spleen: No splenomegaly. Adrenals/Urinary Tract: Bilateral adrenal glands appear normal. No hydronephrosis. Nonobstructive 11 mm left lower pole renal stone. Fluid density exophytic right lower pole renal lesion is compatible with a cyst and considered benign requiring no independent imaging follow-up. Urinary bladder is minimally distended limiting  evaluation. Stomach/Bowel: Stomach is unremarkable for degree of distension. No pathologic dilation of small or large bowel. Scattered colonic diverticulosis. Vascular/Lymphatic: Normal caliber abdominal aorta. No pathologically enlarged abdominal or pelvic lymph nodes. Reproductive: Uterus and bilateral adnexa are unremarkable. Other: A moderate and a large ventral hernia are present both of which are wide-mouth and contain fat and nonobstructed portions of bowel. Musculoskeletal: Mild multilevel degenerative change of the spine. IMPRESSION: 1. Moderate and large wide-mouth ventral hernias both of which contain fat and nonobstructed portions of bowel. 2. Nonobstructive 11 mm left lower pole renal stone. 3. Cholelithiasis without findings of acute cholecystitis. 4. Scattered colonic diverticulosis. Electronically Signed   By: Tama Fails M.D.   On: 08/23/2023 11:19   Assessment & Plan Iron  deficiency anemia secondary to menorrhagia Iron  deficiency anemia with hemoglobin at 7.7 g/dL, significantly below normal (12-15 g/dL). Anemia has persisted for over 4 years, worsening in the past year, likely due to menorrhagia and possibly a uterine polyp. MCV is very low at 58, suggesting microcytic anemia, potentially from iron  deficiency, previous hemoglobin electrophoresis was normal, beta thalassemia ruled out. Ferritin is critically low at 4.1 ng/mL, confirming iron  deficiency. Thalassemia is suspected due to low MCV, pending further evaluation if anemia persists post iron  supplementation. Discussed IV iron  therapy as a faster alternative to oral iron , with risks including mild allergic reactions and rare severe reactions. - Administer IV iron  therapy at [facility], likely using Vanafer, 200 mg, for five sessions over two to five weeks. - Repeat CBC in three to four weeks to monitor blood count recovery. - Consider further testing for thalassemia if anemia does not resolve after iron   supplementation. - She has a history of B12 deficiency, we will repeat a B12 level today.  Heavy menstrual bleeding Menorrhagia with periods lasting six days, first three days particularly heavy, ongoing since November or December of the previous year. Currently on medication to reduce menstrual bleeding, possibly due to a uterine polyp. - Continue current medication to manage menstrual bleeding. - Follow up with gynecologist regarding uterine polyp and potential treatment options.  Uterine polyp Suspected uterine polyp contributing to menorrhagia. Scheduled to discuss with gynecologist. Surgical intervention may be delayed due to low blood counts. - Follow up with gynecologist for evaluation and management of uterine polyp.  Current smoker of cannabis Currently smokes cannabis. Discussed health implications and advised against use unless medically necessary. - Advise cessation of cannabis use unless medically indicated.  Umbilical hernia repair Emergency umbilical hernia repair in July 2021. No current issues reported.  General Health Maintenance Mammogram needed, not yet performed. Provided information on scheduling and insurance coverage. - Order mammogram and provide contact information for  scheduling.  Plan - Will repeat a CBC, ferritin and B12 today - Continue oral iron .  Will schedule IV Venofer 200 mg weekly x 5 at the [facility] history -lab Monthly - Follow-up in 3 months - I ordered a screening mammogram for her today.   Orders Placed This Encounter  Procedures   Ferritin    Standing Status:   Standing    Number of Occurrences:   20    Expiration Date:   09/02/2024   Vitamin B12    Standing Status:   Standing    Number of Occurrences:   5    Expiration Date:   09/02/2024   CBC with Differential/Platelet    Standing Status:   Standing    Number of Occurrences:   50    Expiration Date:   09/02/2024    All questions were answered. The patient knows to call the clinic  with any problems, questions or concerns.  The total time spent in the appointment was 45 minutes including review of chart and various tests results, discussions about plan of care, order placement and coordination of care plan.     Sonja Parks, MD 09/03/2023 5:33 PM

## 2023-09-04 ENCOUNTER — Other Ambulatory Visit: Payer: Self-pay | Admitting: Nurse Practitioner

## 2023-09-04 ENCOUNTER — Other Ambulatory Visit: Payer: Self-pay

## 2023-09-04 ENCOUNTER — Telehealth: Payer: Self-pay | Admitting: Pharmacy Technician

## 2023-09-04 ENCOUNTER — Encounter: Payer: Self-pay | Admitting: Nurse Practitioner

## 2023-09-04 DIAGNOSIS — D509 Iron deficiency anemia, unspecified: Secondary | ICD-10-CM | POA: Insufficient documentation

## 2023-09-04 LAB — FERRITIN: Ferritin: 19 ng/mL (ref 11–307)

## 2023-09-04 NOTE — Telephone Encounter (Signed)
 Auth Submission: NO AUTH NEEDED Site of care: Site of care: [facility] Payer: [employer] Medication & CPT/J Code(s) submitted: Venofer (Iron  Sucrose) J1756 Route of submission (phone, fax, portal):  Phone # Fax # Auth type: Buy/Bill PB Units/visits requested: X5 DOSES Reference number:  Approval from: 09/04/23 to 01/05/24

## 2023-09-04 NOTE — Progress Notes (Signed)
 Dr. Candise Chambers office note faxed to referring provider Cathlean Co, NP w/[facility] [phone]) per Dr. Candise Chambers request.  Fax confirmation received.

## 2023-09-08 NOTE — Progress Notes (Signed)
 Victoria Chavez, Yes, we can administer B12. Plz enter the treatment plan and we will get the patient scheduled as soon as possible.

## 2023-09-09 ENCOUNTER — Ambulatory Visit
Admission: RE | Admit: 2023-09-09 | Discharge: 2023-09-09 | Disposition: A | Discharge status: Home / Self Care | Source: Ambulatory Visit | Attending: Hematology

## 2023-09-09 DIAGNOSIS — Z1231 Encounter for screening mammogram for malignant neoplasm of breast: Secondary | ICD-10-CM

## 2023-09-10 ENCOUNTER — Ambulatory Visit

## 2023-09-11 ENCOUNTER — Ambulatory Visit: Admitting: *Deleted

## 2023-09-11 VITALS — BP 130/84 | HR 72 | Temp 99.2°F | Resp 16 | Ht 67.0 in | Wt 320.6 lb

## 2023-09-11 DIAGNOSIS — D509 Iron deficiency anemia, unspecified: Secondary | ICD-10-CM

## 2023-09-11 DIAGNOSIS — N920 Excessive and frequent menstruation with regular cycle: Secondary | ICD-10-CM

## 2023-09-11 DIAGNOSIS — D500 Iron deficiency anemia secondary to blood loss (chronic): Secondary | ICD-10-CM

## 2023-09-11 DIAGNOSIS — D508 Other iron deficiency anemias: Secondary | ICD-10-CM

## 2023-09-11 MED ORDER — IRON SUCROSE 20 MG/ML IV SOLN
200.0000 mg | Freq: Once | INTRAVENOUS | Status: AC
  Administered 2023-09-11: 200 mg via INTRAVENOUS
  Filled 2023-09-11: qty 10

## 2023-09-11 MED ORDER — DIPHENHYDRAMINE HCL 25 MG PO CAPS
25.0000 mg | ORAL_CAPSULE | Freq: Once | ORAL | Status: AC
  Administered 2023-09-11: 25 mg via ORAL
  Filled 2023-09-11: qty 1

## 2023-09-11 MED ORDER — ACETAMINOPHEN 325 MG PO TABS
650.0000 mg | ORAL_TABLET | Freq: Once | ORAL | Status: AC
Start: 2023-09-11 — End: 2023-09-11
  Administered 2023-09-11: 650 mg via ORAL
  Filled 2023-09-11: qty 2

## 2023-09-11 NOTE — Patient Instructions (Signed)

## 2023-09-11 NOTE — Progress Notes (Signed)
 Diagnosis: Acute Anemia  Provider:  Mannam, Praveen MD  Procedure: IV Push  IV Type: Peripheral, IV Location: R Hand  Venofer (Iron  Sucrose), Dose: 200 mg  Post Infusion IV Care: Observation period completed  Discharge: Condition: Good, Destination: Home . AVS Declined  Performed by:  Devonne Folk, RN

## 2023-09-18 ENCOUNTER — Ambulatory Visit (INDEPENDENT_AMBULATORY_CARE_PROVIDER_SITE_OTHER)

## 2023-09-18 VITALS — BP 133/84 | HR 87 | Temp 98.0°F | Resp 16 | Ht 67.0 in | Wt 320.4 lb

## 2023-09-18 DIAGNOSIS — D508 Other iron deficiency anemias: Secondary | ICD-10-CM

## 2023-09-18 DIAGNOSIS — N920 Excessive and frequent menstruation with regular cycle: Secondary | ICD-10-CM

## 2023-09-18 DIAGNOSIS — D500 Iron deficiency anemia secondary to blood loss (chronic): Secondary | ICD-10-CM | POA: Diagnosis not present

## 2023-09-18 DIAGNOSIS — D509 Iron deficiency anemia, unspecified: Secondary | ICD-10-CM

## 2023-09-18 MED ORDER — DIPHENHYDRAMINE HCL 25 MG PO CAPS
25.0000 mg | ORAL_CAPSULE | Freq: Once | ORAL | Status: AC
  Administered 2023-09-18: 25 mg via ORAL
  Filled 2023-09-18: qty 1

## 2023-09-18 MED ORDER — ACETAMINOPHEN 325 MG PO TABS
650.0000 mg | ORAL_TABLET | Freq: Once | ORAL | Status: AC
  Administered 2023-09-18: 650 mg via ORAL
  Filled 2023-09-18: qty 2

## 2023-09-18 MED ORDER — IRON SUCROSE 20 MG/ML IV SOLN
200.0000 mg | Freq: Once | INTRAVENOUS | Status: AC
  Administered 2023-09-18: 200 mg via INTRAVENOUS
  Filled 2023-09-18: qty 10

## 2023-09-18 NOTE — Progress Notes (Signed)
 Diagnosis: Acute Anemia  Provider:  Praveen Mannam MD  Procedure: IV Push  IV Type: Peripheral, IV Location: L Hand  Venofer  (Iron  Sucrose), Dose: 200 mg  Post Infusion IV Care: Observation period completed  Discharge: Condition: Good, Destination: Home . AVS Provided  Performed by:  Rachelle Bue, RN

## 2023-09-25 ENCOUNTER — Encounter: Payer: Self-pay | Admitting: Nurse Practitioner

## 2023-09-25 ENCOUNTER — Ambulatory Visit

## 2023-09-25 VITALS — BP 123/74 | HR 87 | Temp 98.3°F | Resp 16 | Ht 67.0 in | Wt 319.6 lb

## 2023-09-25 DIAGNOSIS — D509 Iron deficiency anemia, unspecified: Secondary | ICD-10-CM

## 2023-09-25 DIAGNOSIS — D508 Other iron deficiency anemias: Secondary | ICD-10-CM

## 2023-09-25 DIAGNOSIS — N920 Excessive and frequent menstruation with regular cycle: Secondary | ICD-10-CM

## 2023-09-25 DIAGNOSIS — D500 Iron deficiency anemia secondary to blood loss (chronic): Secondary | ICD-10-CM

## 2023-09-25 MED ORDER — ACETAMINOPHEN 325 MG PO TABS
650.0000 mg | ORAL_TABLET | Freq: Once | ORAL | Status: AC
  Administered 2023-09-25: 650 mg via ORAL
  Filled 2023-09-25: qty 2

## 2023-09-25 MED ORDER — IRON SUCROSE 20 MG/ML IV SOLN
200.0000 mg | Freq: Once | INTRAVENOUS | Status: AC
  Administered 2023-09-25: 200 mg via INTRAVENOUS
  Filled 2023-09-25: qty 10

## 2023-09-25 MED ORDER — DIPHENHYDRAMINE HCL 25 MG PO CAPS
25.0000 mg | ORAL_CAPSULE | Freq: Once | ORAL | Status: AC
  Administered 2023-09-25: 25 mg via ORAL
  Filled 2023-09-25: qty 1

## 2023-09-25 NOTE — Progress Notes (Signed)
 Diagnosis: Acute Anemia  Provider:  Praveen Mannam MD  Procedure: IV Push  IV Type: Peripheral, IV Location: R Hand  Venofer  (Iron  Sucrose), Dose: 200 mg  Post Infusion IV Care: Observation period completed  Discharge: Condition: Good, Destination: Home . AVS Declined  Performed by:  Rachelle Bue, RN

## 2023-10-01 ENCOUNTER — Other Ambulatory Visit: Payer: Self-pay

## 2023-10-01 ENCOUNTER — Inpatient Hospital Stay: Attending: Hematology

## 2023-10-01 DIAGNOSIS — D500 Iron deficiency anemia secondary to blood loss (chronic): Secondary | ICD-10-CM | POA: Diagnosis present

## 2023-10-01 DIAGNOSIS — N920 Excessive and frequent menstruation with regular cycle: Secondary | ICD-10-CM | POA: Insufficient documentation

## 2023-10-01 DIAGNOSIS — E538 Deficiency of other specified B group vitamins: Secondary | ICD-10-CM | POA: Insufficient documentation

## 2023-10-01 DIAGNOSIS — D649 Anemia, unspecified: Secondary | ICD-10-CM

## 2023-10-01 LAB — CBC WITH DIFFERENTIAL/PLATELET
Abs Immature Granulocytes: 0.04 10*3/uL (ref 0.00–0.07)
Basophils Absolute: 0.1 10*3/uL (ref 0.0–0.1)
Basophils Relative: 1 %
Eosinophils Absolute: 0.2 10*3/uL (ref 0.0–0.5)
Eosinophils Relative: 3 %
HCT: 32 % — ABNORMAL LOW (ref 36.0–46.0)
Hemoglobin: 9.4 g/dL — ABNORMAL LOW (ref 12.0–15.0)
Immature Granulocytes: 1 %
Lymphocytes Relative: 24 %
Lymphs Abs: 2.1 10*3/uL (ref 0.7–4.0)
MCH: 19.3 pg — ABNORMAL LOW (ref 26.0–34.0)
MCHC: 29.4 g/dL — ABNORMAL LOW (ref 30.0–36.0)
MCV: 65.6 fL — ABNORMAL LOW (ref 80.0–100.0)
Monocytes Absolute: 0.8 10*3/uL (ref 0.1–1.0)
Monocytes Relative: 9 %
Neutro Abs: 5.6 10*3/uL (ref 1.7–7.7)
Neutrophils Relative %: 62 %
Platelets: 359 10*3/uL (ref 150–400)
RBC: 4.88 MIL/uL (ref 3.87–5.11)
RDW: 26.1 % — ABNORMAL HIGH (ref 11.5–15.5)
WBC: 8.8 10*3/uL (ref 4.0–10.5)
nRBC: 0 % (ref 0.0–0.2)

## 2023-10-01 LAB — VITAMIN B12: Vitamin B-12: 100 pg/mL — ABNORMAL LOW (ref 180–914)

## 2023-10-02 ENCOUNTER — Ambulatory Visit: Payer: Self-pay | Admitting: Nurse Practitioner

## 2023-10-02 ENCOUNTER — Ambulatory Visit

## 2023-10-02 VITALS — BP 119/78 | HR 79 | Temp 98.4°F | Resp 20 | Ht 67.0 in | Wt 322.8 lb

## 2023-10-02 DIAGNOSIS — D500 Iron deficiency anemia secondary to blood loss (chronic): Secondary | ICD-10-CM

## 2023-10-02 DIAGNOSIS — N920 Excessive and frequent menstruation with regular cycle: Secondary | ICD-10-CM | POA: Diagnosis not present

## 2023-10-02 DIAGNOSIS — D509 Iron deficiency anemia, unspecified: Secondary | ICD-10-CM

## 2023-10-02 DIAGNOSIS — D508 Other iron deficiency anemias: Secondary | ICD-10-CM

## 2023-10-02 HISTORY — DX: Iron deficiency anemia, unspecified: D50.9

## 2023-10-02 LAB — FERRITIN: Ferritin: 98 ng/mL (ref 11–307)

## 2023-10-02 MED ORDER — DIPHENHYDRAMINE HCL 25 MG PO CAPS
25.0000 mg | ORAL_CAPSULE | Freq: Once | ORAL | Status: AC
  Administered 2023-10-02: 25 mg via ORAL
  Filled 2023-10-02: qty 1

## 2023-10-02 MED ORDER — ACETAMINOPHEN 325 MG PO TABS
650.0000 mg | ORAL_TABLET | Freq: Once | ORAL | Status: AC
  Administered 2023-10-02: 650 mg via ORAL
  Filled 2023-10-02: qty 2

## 2023-10-02 MED ORDER — IRON SUCROSE 20 MG/ML IV SOLN
200.0000 mg | Freq: Once | INTRAVENOUS | Status: AC
  Administered 2023-10-02: 200 mg via INTRAVENOUS
  Filled 2023-10-02: qty 10

## 2023-10-02 NOTE — Progress Notes (Signed)
 Diagnosis: Iron Deficiency Anemia  Provider:  Chilton Greathouse MD  Procedure: IV Push  IV Type: Peripheral, IV Location: R Hand  Venofer (Iron Sucrose), Dose: 200 mg  Post Infusion IV Care: Observation period completed and Peripheral IV Discontinued  Discharge: Condition: Good, Destination: Home . AVS Declined  Performed by:  Rico Ala, LPN

## 2023-10-03 ENCOUNTER — Encounter: Payer: Self-pay | Admitting: Nurse Practitioner

## 2023-10-03 NOTE — Telephone Encounter (Addendum)
 Called patient to relay message below as per Lacie Burton, patient agreed to doing the B12 injections she would like to do the first one here and then do the others at home herself if that is possible. She had no further questions at this time.     ----- Message from Lacie K Burton sent at 10/02/2023 10:31 AM EDT ----- Please let pt know labs, anemia has improved slightly, ferritin responded well to IV iron , but B12 remains very low. I recommend B12 weekly x6 loading dose then monthly. Please let me know if she agrees, I will place orders.   Thanks Lacie NP

## 2023-10-07 ENCOUNTER — Other Ambulatory Visit: Payer: Self-pay | Admitting: Nurse Practitioner

## 2023-10-07 ENCOUNTER — Other Ambulatory Visit: Payer: Self-pay | Admitting: Obstetrics and Gynecology

## 2023-10-07 ENCOUNTER — Encounter (HOSPITAL_COMMUNITY): Payer: Self-pay | Admitting: Obstetrics and Gynecology

## 2023-10-07 ENCOUNTER — Telehealth: Payer: Self-pay | Admitting: Hematology

## 2023-10-07 ENCOUNTER — Other Ambulatory Visit: Payer: Self-pay

## 2023-10-07 DIAGNOSIS — N939 Abnormal uterine and vaginal bleeding, unspecified: Secondary | ICD-10-CM

## 2023-10-07 DIAGNOSIS — E538 Deficiency of other specified B group vitamins: Secondary | ICD-10-CM | POA: Insufficient documentation

## 2023-10-07 NOTE — H&P (Deleted)
   The note originally documented on this encounter has been moved the the encounter in which it belongs.

## 2023-10-07 NOTE — H&P (Signed)
 Subjective:    Chief Complaint(s): Abnormal uterine bleeding and Endocervical polyp    HPI:      General 43 y/o presents for a pre-op visit. Pt is schedule for a hysteroscopy Dilation and curettage polypectomy with myosure on 10/08/2023 for the management of AUB and large endocervical polyp.    IN REVIEW:  Records form [facility] reviewed. CT scan from  [facility] perormed 08/12/2023 reveals large ventral hernia. She has had this repair 2 times in the past.  Her general surgeon has recommended delaying a third repair until she decides if she would like to have gastric bypass surgery.  --- She reports bleeding for the last 3 months. daily. she was prescribed provera 20mg  tid for 7 days and her mesnes did not start however she has still had daily bleeding from the polyp. She had a hgb of 7.7 08/26/2023 she was referred to  hematology and is scheduled for IV venofer  for 5 weeks.      Current Medication:     Taking amLODIPine  Besylate 5 MG Tablet TAKE 1 TABLET BY MOUTH EVERY DAY Oral. Ferrous Sulfate 325 (65 Fe) MG Tablet 1 tablet Orally Once daily. Spironolactone 25 MG Tablet 1 tablet Orally Once a day.     Discontinued Provera(medroxyPROGESTERone Acetate) 10 MG Tablet 1 tablet with food Orally Twice a day. Medication List reviewed and reconciled with the patient.    Medical History: hypertension Large Ventral Umbilical Hernia Morbid obesity    Allergies/Intolerance:      N.K.D.A.    Gyn History: Sexual activity not currently sexually active.  Periods : irregular.  LMP patient is currently bleeding due to polyp.  Denies Birth control.  Last pap smear date 06/2023 per patient.  Denies Last mammogram date.  Denies Abnormal pap smear.  STD TRICH.  Menarche 12.     OB History: Never been pregnant  per patient.     Surgical History: umbilical hernia mesh repair 2013, ~2023    Hospitalization: see surgical hx 2013, ~2023    Family History: Father: deceased,  unknown Mother: deceased, cervical cancer Paternal Grand Father: deceased Paternal Grand Mother: deceased Maternal Grand Father: deceased Maternal Grand Mother: deceased Brother 1: alive Brother2: alive, diagnosed with Hypertension, Diabetes Brother 3: alive, diagnosed with Hypertension, Diabetes Sister 1: alive, colostomy bag Sister 2: alive Sister 3: alive 4 brother(s) , 3 sister(s) . unknown hx for granparents denies hx of breast, ovarian, and uterine cancers.    Social History:      General Tobacco use:   cigarettes:  Never smoked, Tobacco history last updated  09/25/2023, Vaping  No.  Alcohol:  yes occasionally. Caffeine:  yes 6 servings per day. Recreational drug use:  yes marijuana daily. OCCUPATION:  Conservation officer, nature at [employer].. Sexual abuse:  no.      ROS:      CONSTITUTIONAL Chills  No.  Fatigue  No.  Fever  No.  Night sweats  No.  Recent travel outside [place]   No.  Sweats  No.  Weight change  No.       OPHTHALMOLOGY Blurring of vision  no.  Change in vision  no.  Double vision  no.       ENT Dizziness  no.  Nose bleeds  no.  Sore throat  no.  Teeth pain  no.       ALLERGY Hives  no.       CARDIOLOGY Chest pain  no.  High blood pressure  no.  Irregular heart beat  no.  Leg edema  no.  Palpitations  no.       RESPIRATORY Shortness of breath  no.  Cough  no.  Wheezing  no.       UROLOGY Pain with urination  no.  Urinary urgency  no.  Urinary frequency  no.  Urinary incontinence  no.  Difficulty urinating  No.  Blood in urine  No.       GASTROENTEROLOGY Abdominal pain  no.  Appetite change  no.  Bloating/belching  no.  Blood in stool or on toilet paper  no.  Change in bowel movements  no.  Constipation  no.  Diarrhea  no.  Difficulty swallowing  no.  Nausea  no.       FEMALE REPRODUCTIVE Vulvar pain  no.  Vulvar rash  no.  Abnormal vaginal bleeding  , yes.  Breast pain  no.  Nipple discharge  no.  Pain with intercourse  no.  Pelvic pain  no.  Unusual vaginal discharge  no.   Vaginal itching  no.       MUSCULOSKELETAL Muscle aches  no.       NEUROLOGY Headache  no.  Tingling/numbness  no.  Weakness  no.       PSYCHOLOGY Depression  no.  Anxiety  no.  Nervousness  no.  Sleep disturbances  no.  Suicidal ideation  no .       ENDOCRINOLOGY Excessive thirst  no.  Excessive urination  no.  Hair loss  no.  Heat or cold intolerance  no.       HEMATOLOGY/LYMPH Abnormal bleeding  no.  Easy bruising  no.  Swollen glands  no.       DERMATOLOGY New/changing skin lesion  no.  Rash  no.  Sores  no.  Negative except as stated in HPI.   Objective:    Vitals: Wt: 320.6, Wt change: -3.6 lbs, Ht: 67, BMI: 50.21, Pulse sitting: 93, BP sitting: 136/85.    Past Results:    Examination:      General Examination CONSTITUTIONAL: alert, oriented, NAD.  SKIN:  moist, warm.  EYES:  Conjunctiva clear.  LUNGS: good I:E efffort noted, clear to auscultation bilaterally.  HEART: regular rate and rhythm.  ABDOMEN: soft, non-tender/non-distended, bowel sounds present.  FEMALE GENITOURINARY: normal external genitalia, labia - unremarkable, vagina - pink moist mucosa, no lesions or abnormal discharge, cervix - no discharge or lesions or CMT.. Large 5 cm endocervical polyp , adnexa -  bimanual exam is limited by the patients body habitus. , uterus - bimanual exam is limited by patients body habitus.  EXTREMITIES: no edema present.  PSYCH:  affect normal, good eye contact.     Physical Examination:      Chaperone present Chaperone present Plata, Menda 09/25/2023 04:45:46 PM >, for pelvic exam.    Assessment:    Assessment: Abnormal uterine bleeding (AUB) - N93.9 (Primary)    Endocervical polyp - N84.1      Plan:    Treatment:     Abnormal uterine bleeding (AUB)     Notes: Planning hysteroscopy D/C polypectomy with myosure. Pt is advised she will be able to return home the same day if she is doing well. Discussed risks of hysteroscopy including but not limited to infection,  bleeding, possible perforation of the uterus, with the need for further surgery. Pt advised to avoid NSAIDs (Aspirin, Aleve , Advil , Ibuprofen , Motrin ) from now until surgery given risk of bleeding during surgery. She may take Tylenol  for pain  management. She is advised to avoid eating or drinking starting midnight prior to surgery. Discussed post-surgery avoidance of driving for 24 hours or intercourse for 2 weeks after procedu     Endocervical polyp     Notes: Planning hysteroscopy D/C polypectomy with myosure. Pt is advised she will be able to return home the same day if she is doing well. Discussed risks of hysteroscopy including but not limited to infection, bleeding, possible perforation of the uterus, with the need for further surgery. Pt advised to avoid NSAIDs (Aspirin, Aleve , Advil , Ibuprofen , Motrin ) from now until surgery given risk of bleeding during surgery. She may take Tylenol  for pain management. She is advised to avoid eating or drinking starting midnight prior to surgery. Discussed post-surgery avoidance of driving for 24 hours or intercourse for 2 weeks after procedure.

## 2023-10-07 NOTE — Progress Notes (Signed)
 PCP - Dr Reford Canterbury Cardiologist - n/a Oncology - Dr Sonja Northampton  Chest x-ray - n/a EKG - DOS Stress Test - n/a ECHO - n/a Cardiac Cath - n/a  ICD Pacemaker/Loop - n/a  Sleep Study -  Yes CPAP - does not use CPAP  Diabetes - n/a  ASA & Blood Thinner Instructions:  n/a  NPO  Anesthesia review: Yes  STOP now taking any Aspirin (unless otherwise instructed by your surgeon), Aleve , Naproxen , Ibuprofen , Motrin , Advil , Goody's, BC's, all herbal medications, fish oil, and all vitamins.   Coronavirus Screening Do you have any of the following symptoms:  Cough yes/no: No Fever (>100.60F)  yes/no: No Runny nose yes/no: No Sore throat yes/no: No Difficulty breathing/shortness of breath  yes/no: No  Have you traveled in the last 14 days and where? yes/no: No  Patient verbalized understanding of instructions that were given via phone.

## 2023-10-08 ENCOUNTER — Encounter (HOSPITAL_COMMUNITY): Payer: Self-pay | Admitting: Obstetrics and Gynecology

## 2023-10-08 ENCOUNTER — Ambulatory Visit (HOSPITAL_COMMUNITY)
Admission: RE | Admit: 2023-10-08 | Discharge: 2023-10-08 | Disposition: A | Discharge status: Home / Self Care | Attending: Obstetrics and Gynecology | Admitting: Obstetrics and Gynecology

## 2023-10-08 ENCOUNTER — Other Ambulatory Visit: Payer: Self-pay

## 2023-10-08 ENCOUNTER — Ambulatory Visit (HOSPITAL_COMMUNITY)

## 2023-10-08 ENCOUNTER — Encounter (HOSPITAL_COMMUNITY)
Admission: RE | Disposition: A | Discharge status: Home / Self Care | Payer: Self-pay | Source: Home / Self Care | Attending: Obstetrics and Gynecology

## 2023-10-08 DIAGNOSIS — N939 Abnormal uterine and vaginal bleeding, unspecified: Secondary | ICD-10-CM

## 2023-10-08 DIAGNOSIS — I10 Essential (primary) hypertension: Secondary | ICD-10-CM | POA: Insufficient documentation

## 2023-10-08 DIAGNOSIS — G4730 Sleep apnea, unspecified: Secondary | ICD-10-CM

## 2023-10-08 DIAGNOSIS — Z8249 Family history of ischemic heart disease and other diseases of the circulatory system: Secondary | ICD-10-CM | POA: Diagnosis not present

## 2023-10-08 DIAGNOSIS — Z6841 Body Mass Index (BMI) 40.0 and over, adult: Secondary | ICD-10-CM | POA: Diagnosis not present

## 2023-10-08 DIAGNOSIS — N840 Polyp of corpus uteri: Secondary | ICD-10-CM | POA: Diagnosis not present

## 2023-10-08 DIAGNOSIS — Z87891 Personal history of nicotine dependence: Secondary | ICD-10-CM

## 2023-10-08 DIAGNOSIS — E6689 Other obesity not elsewhere classified: Secondary | ICD-10-CM | POA: Diagnosis not present

## 2023-10-08 HISTORY — PX: MYOSURE RESECTION: SHX7611

## 2023-10-08 HISTORY — PX: HYSTEROSCOPY WITH D & C: SHX1775

## 2023-10-08 HISTORY — DX: Dyspnea, unspecified: R06.00

## 2023-10-08 LAB — BASIC METABOLIC PANEL WITH GFR
Anion gap: 7 (ref 5–15)
BUN: 8 mg/dL (ref 6–20)
CO2: 25 mmol/L (ref 22–32)
Calcium: 8.5 mg/dL — ABNORMAL LOW (ref 8.9–10.3)
Chloride: 103 mmol/L (ref 98–111)
Creatinine, Ser: 0.61 mg/dL (ref 0.44–1.00)
GFR, Estimated: >60 mL/min (ref >60)
Glucose, Bld: 109 mg/dL — ABNORMAL HIGH (ref 70–99)
Potassium: 3.3 mmol/L — ABNORMAL LOW (ref 3.5–5.1)
Sodium: 135 mmol/L (ref 135–145)

## 2023-10-08 LAB — CBC
HCT: 38.7 % (ref 36.0–46.0)
Hemoglobin: 10.9 g/dL — ABNORMAL LOW (ref 12.0–15.0)
MCH: 19.6 pg — ABNORMAL LOW (ref 26.0–34.0)
MCHC: 28.2 g/dL — ABNORMAL LOW (ref 30.0–36.0)
MCV: 69.5 fL — ABNORMAL LOW (ref 80.0–100.0)
Platelets: 347 10*3/uL (ref 150–400)
RBC: 5.57 MIL/uL — ABNORMAL HIGH (ref 3.87–5.11)
RDW: 26.6 % — ABNORMAL HIGH (ref 11.5–15.5)
WBC: 9.1 10*3/uL (ref 4.0–10.5)
nRBC: 0 % (ref 0.0–0.2)

## 2023-10-08 LAB — RPR: RPR Ser Ql: NONREACTIVE

## 2023-10-08 LAB — POCT PREGNANCY, URINE: Preg Test, Ur: NEGATIVE

## 2023-10-08 SURGERY — DILATATION AND CURETTAGE /HYSTEROSCOPY
Anesthesia: General

## 2023-10-08 MED ORDER — FENTANYL CITRATE (PF) 250 MCG/5ML IJ SOLN
INTRAMUSCULAR | Status: DC | PRN
  Administered 2023-10-08 (×5): 50 ug via INTRAVENOUS

## 2023-10-08 MED ORDER — CHLORHEXIDINE GLUCONATE 0.12 % MT SOLN
15.0000 mL | Freq: Once | OROMUCOSAL | Status: AC
Start: 2023-10-08 — End: 2023-10-08
  Administered 2023-10-08: 15 mL via OROMUCOSAL
  Filled 2023-10-08: qty 15

## 2023-10-08 MED ORDER — PROPOFOL 10 MG/ML IV BOLUS
INTRAVENOUS | Status: AC
  Filled 2023-10-08: qty 20

## 2023-10-08 MED ORDER — TRANEXAMIC ACID-NACL 1000-0.7 MG/100ML-% IV SOLN
INTRAVENOUS | Status: AC
  Filled 2023-10-08: qty 100

## 2023-10-08 MED ORDER — ORAL CARE MOUTH RINSE: 15.0000 mL | Freq: Once | OROMUCOSAL | Status: AC

## 2023-10-08 MED ORDER — IBUPROFEN 800 MG PO TABS: 800.0000 mg | ORAL_TABLET | Freq: Three times a day (TID) | ORAL | 0 refills | Status: AC | PRN

## 2023-10-08 MED ORDER — DEXAMETHASONE SODIUM PHOSPHATE 10 MG/ML IJ SOLN
INTRAMUSCULAR | Status: AC
  Filled 2023-10-08: qty 1

## 2023-10-08 MED ORDER — ONDANSETRON HCL 4 MG/2ML IJ SOLN
INTRAMUSCULAR | Status: DC | PRN
  Administered 2023-10-08: 4 mg via INTRAVENOUS

## 2023-10-08 MED ORDER — SODIUM CHLORIDE 0.9 % IR SOLN
Status: DC | PRN
  Administered 2023-10-08 (×3): 3000 mL

## 2023-10-08 MED ORDER — ACETAMINOPHEN 500 MG PO TABS
1000.0000 mg | ORAL_TABLET | ORAL | Status: AC
  Administered 2023-10-08: 1000 mg via ORAL
  Filled 2023-10-08: qty 2

## 2023-10-08 MED ORDER — BUPIVACAINE HCL (PF) 0.25 % IJ SOLN
INTRAMUSCULAR | Status: DC | PRN
  Administered 2023-10-08: 20 mL

## 2023-10-08 MED ORDER — MIDAZOLAM HCL 2 MG/2ML IJ SOLN
INTRAMUSCULAR | Status: DC | PRN
  Administered 2023-10-08: 2 mg via INTRAVENOUS

## 2023-10-08 MED ORDER — LACTATED RINGERS IV SOLN: INTRAVENOUS | Status: DC

## 2023-10-08 MED ORDER — TRANEXAMIC ACID 1000 MG/10ML IV SOLN
INTRAVENOUS | Status: DC | PRN
  Administered 2023-10-08: 1000 mg via INTRAVENOUS

## 2023-10-08 MED ORDER — DEXAMETHASONE SODIUM PHOSPHATE 10 MG/ML IJ SOLN
INTRAMUSCULAR | Status: DC | PRN
  Administered 2023-10-08: 10 mg via INTRAVENOUS

## 2023-10-08 MED ORDER — LIDOCAINE 2% (20 MG/ML) 5 ML SYRINGE
INTRAMUSCULAR | Status: DC | PRN
  Administered 2023-10-08: 60 mg via INTRAVENOUS

## 2023-10-08 MED ORDER — KETOROLAC TROMETHAMINE 30 MG/ML IJ SOLN
INTRAMUSCULAR | Status: DC | PRN
Start: 2023-10-08 — End: 2023-10-08
  Administered 2023-10-08: 30 mg via INTRAVENOUS

## 2023-10-08 MED ORDER — FENTANYL CITRATE (PF) 250 MCG/5ML IJ SOLN
INTRAMUSCULAR | Status: AC
  Filled 2023-10-08: qty 5

## 2023-10-08 MED ORDER — POVIDONE-IODINE 10 % EX SWAB
2.0000 | Freq: Once | CUTANEOUS | Status: AC
  Administered 2023-10-08: 2 via TOPICAL

## 2023-10-08 MED ORDER — OXYCODONE HCL 5 MG PO TABS: 5.0000 mg | ORAL_TABLET | ORAL | 0 refills | Status: DC | PRN

## 2023-10-08 MED ORDER — LIDOCAINE 2% (20 MG/ML) 5 ML SYRINGE
INTRAMUSCULAR | Status: AC
  Filled 2023-10-08: qty 5

## 2023-10-08 MED ORDER — PROPOFOL 10 MG/ML IV BOLUS
INTRAVENOUS | Status: AC
Start: 2023-10-08 — End: 2023-10-08
  Filled 2023-10-08: qty 20

## 2023-10-08 MED ORDER — MIDAZOLAM HCL 2 MG/2ML IJ SOLN
INTRAMUSCULAR | Status: AC
Start: 2023-10-08 — End: 2023-10-08
  Filled 2023-10-08: qty 2

## 2023-10-08 MED ORDER — PROPOFOL 10 MG/ML IV BOLUS
INTRAVENOUS | Status: DC | PRN
  Administered 2023-10-08: 100 mg via INTRAVENOUS
  Administered 2023-10-08: 200 mg via INTRAVENOUS
  Administered 2023-10-08: 50 mg via INTRAVENOUS

## 2023-10-08 MED ORDER — ONDANSETRON HCL 4 MG/2ML IJ SOLN
INTRAMUSCULAR | Status: AC
Start: 2023-10-08 — End: 2023-10-08
  Filled 2023-10-08: qty 2

## 2023-10-08 MED ORDER — BUPIVACAINE HCL (PF) 0.25 % IJ SOLN
INTRAMUSCULAR | Status: AC
  Filled 2023-10-08: qty 20

## 2023-10-08 SURGICAL SUPPLY — 18 items
CANISTER SUCTION 3000ML PPV (SUCTIONS) ×1 IMPLANT
CATH ROBINSON RED A/P 16FR (CATHETERS) ×1 IMPLANT
CNTNR URN SCR LID CUP LEK RST (MISCELLANEOUS) IMPLANT
COVER MAYO STAND STRL (DRAPES) ×1 IMPLANT
DEVICE MYOSURE LITE (MISCELLANEOUS) IMPLANT
DEVICE MYOSURE REACH (MISCELLANEOUS) IMPLANT
DILATOR CANAL MILEX (MISCELLANEOUS) ×1 IMPLANT
GLOVE BIOGEL PI IND STRL 6.5 (GLOVE) ×1 IMPLANT
GLOVE SURG ENC TEXT LTX SZ6.5 (GLOVE) ×1 IMPLANT
GOWN STRL REUS W/ TWL LRG LVL3 (GOWN DISPOSABLE) ×2 IMPLANT
KIT PROCEDURE FLUENT (KITS) ×1 IMPLANT
KIT TURNOVER KIT B (KITS) ×1 IMPLANT
PACK VAGINAL MINOR WOMEN LF (CUSTOM PROCEDURE TRAY) ×1 IMPLANT
PAD OB MATERNITY 11 LF (PERSONAL CARE ITEMS) ×1 IMPLANT
SEAL ROD LENS SCOPE MYOSURE (ABLATOR) ×1 IMPLANT
SOL .9 NS 3000ML IRR UROMATIC (IV SOLUTION) IMPLANT
TOWEL GREEN STERILE FF (TOWEL DISPOSABLE) ×1 IMPLANT
UNDERPAD 30X36 HEAVY ABSORB (UNDERPADS AND DIAPERS) ×1 IMPLANT

## 2023-10-08 NOTE — Transfer of Care (Signed)
 Immediate Anesthesia Transfer of Care Note  Patient: Victoria Chavez  Procedure(s) Performed: DILATATION AND CURETTAGE Larri Ply RESECTION POLYPECTOMY  Patient Location: PACU  Anesthesia Type:General  Level of Consciousness: awake, alert , oriented, and patient cooperative  Airway & Oxygen Therapy: Patient Spontanous Breathing and Patient connected to nasal cannula oxygen  Post-op Assessment: Report given to RN and Post -op Vital signs reviewed and stable  Post vital signs: Reviewed and stable  Last Vitals:  Vitals Value Taken Time  BP 161/93 10/08/23 1134  Temp    Pulse 86 10/08/23 1134  Resp 21 10/08/23 1134  SpO2 96 % 10/08/23 1134  Vitals shown include unfiled device data.  Last Pain:  Vitals:   10/08/23 0821  TempSrc:   PainSc: 0-No pain      Patients Stated Pain Goal: 0 (10/08/23 1610)  Complications: No notable events documented.

## 2023-10-08 NOTE — Op Note (Signed)
 10/08/2023  11:36 AM  PATIENT:  Victoria Chavez  43 y.o. female  PRE-OPERATIVE DIAGNOSIS:  Abnormal Uterine Bleeding/ Endometrial polyp  POST-OPERATIVE DIAGNOSIS:  Abnormal Uterine Bleeding/ Endometrial polyp  PROCEDURE:  Procedure(s): DILATATION AND CURETTAGE /HYSTEROSCOPY (N/A) MYOSURE RESECTION POLYPECTOMY (N/A)  SURGEON:  Surgeons and Role:    Arlee Lace, MD - Primary  PHYSICIAN ASSISTANT:   ASSISTANTS: none   ANESTHESIA:   general  EBL:  125 mL   BLOOD ADMINISTERED:none  DRAINS: none   LOCAL MEDICATIONS USED:  MARCAINE     SPECIMEN:  Source of Specimen:  Endometrial polyps and curetting's   DISPOSITION OF SPECIMEN:  PATHOLOGY  COUNTS:  YES  TOURNIQUET:  * No tourniquets in log *  DICTATION: .Note written in EPIC  PLAN OF CARE: Discharge to home after PACU  PATIENT DISPOSITION:  PACU - hemodynamically stable.   Delay start of Pharmacological VTE agent (>24 hrs) due to surgical blood loss or risk of bleeding: not applicable  Findings: Normal external genitalia. Normal vaginal mucosa. Cervix is dilated 1 cm due to large endometrial polyp. 2 endometrial polyps noted. One polyp was 5 cm in length with a broad base.   Saline Deficit: 870 cc   Procedure: Patient was taken to the operating room #18 at [facility], where she was placed under general anesthesia. She was placed in the dorsal lithotomy position. She was prepped and draped in the usual sterile fashion. A speculum was placed into the vaginal vault. The anterior lip of the cervix was grasped with a single-tooth tenaculum. Quarter percent Marcaine was injected at the 4 and 8:00 positions of the cervix. The uterus was then sounded to 8 cm. The cervix was already dilated from the large endometrial polyp.  Mysosure operative  hysteroscope was inserted. The findings noted above. Mysosure reach  blade was introduced through the hysteroscope. The base of the large polyp was excised at the base with the  Mysosure reach blade. The smaller polyp was then excised with the Mysosure reach blade.   There was no evidence of perforation. Hysteroscope was then removed. Sharp curette was inserted and endometrial curetting's were obtained. The hysteroscope was reinserted. There was no sign of uterine  perforation. .The hysteroscope was removed.  The single-tooth tenaculum was removed from the anterior lip of the cervix. Patient was noted to have moderate amount of bleeding from the uterus. 1 Gram of TXA was given. The speculum was removed from the patient's vagina. She was awakened from anesthesia  and taken care  To the recovery  room awake and in stable condition. Sponge lap and needle counts were correct x 2.

## 2023-10-08 NOTE — H&P (Signed)
 Date of Initial H&P: 10/07/2023  History reviewed, patient examined, no change in status, stable for surgery.

## 2023-10-08 NOTE — Anesthesia Preprocedure Evaluation (Signed)
 Anesthesia Evaluation  Patient identified by MRN, date of birth, ID band Patient awake    Reviewed: Allergy & Precautions, H&P , NPO status , Patient's Chart, lab work & pertinent test results  Airway Mallampati: II   Neck ROM: full    Dental   Pulmonary shortness of breath, sleep apnea , Patient abstained from smoking., former smoker   breath sounds clear to auscultation       Cardiovascular hypertension,  Rhythm:regular Rate:Normal     Neuro/Psych    GI/Hepatic   Endo/Other    Class 4 obesity  Renal/GU      Musculoskeletal   Abdominal   Peds  Hematology   Anesthesia Other Findings   Reproductive/Obstetrics                             Anesthesia Physical Anesthesia Plan  ASA: 3  Anesthesia Plan: General   Post-op Pain Management:    Induction: Intravenous  PONV Risk Score and Plan: 3 and Ondansetron , Dexamethasone , Midazolam  and Treatment may vary due to age or medical condition  Airway Management Planned: LMA  Additional Equipment:   Intra-op Plan:   Post-operative Plan: Extubation in OR  Informed Consent: I have reviewed the patients History and Physical, chart, labs and discussed the procedure including the risks, benefits and alternatives for the proposed anesthesia with the patient or authorized representative who has indicated his/her understanding and acceptance.     Dental advisory given  Plan Discussed with: CRNA, Anesthesiologist and Surgeon  Anesthesia Plan Comments:        Anesthesia Quick Evaluation

## 2023-10-08 NOTE — Anesthesia Procedure Notes (Addendum)
 Procedure Name: LMA Insertion Date/Time: 10/08/2023 10:05 AM  Performed by: Ballard Levels, CRNAPre-anesthesia Checklist: Patient identified, Emergency Drugs available, Suction available and Patient being monitored Patient Re-evaluated:Patient Re-evaluated prior to induction Oxygen Delivery Method: Circle System Utilized Preoxygenation: Pre-oxygenation with 100% oxygen Induction Type: IV induction Ventilation: Mask ventilation without difficulty LMA: LMA inserted LMA Size: 4.0 Number of attempts: 1 Airway Equipment and Method: Bite block Placement Confirmation: positive ETCO2 Tube secured with: Tape Dental Injury: Teeth and Oropharynx as per pre-operative assessment

## 2023-10-09 ENCOUNTER — Encounter (HOSPITAL_COMMUNITY): Payer: Self-pay | Admitting: Obstetrics and Gynecology

## 2023-10-09 ENCOUNTER — Ambulatory Visit

## 2023-10-09 VITALS — BP 126/84 | HR 77 | Temp 98.2°F | Resp 18 | Ht 67.0 in | Wt 321.0 lb

## 2023-10-09 DIAGNOSIS — N920 Excessive and frequent menstruation with regular cycle: Secondary | ICD-10-CM | POA: Diagnosis not present

## 2023-10-09 DIAGNOSIS — D500 Iron deficiency anemia secondary to blood loss (chronic): Secondary | ICD-10-CM | POA: Diagnosis not present

## 2023-10-09 DIAGNOSIS — D509 Iron deficiency anemia, unspecified: Secondary | ICD-10-CM

## 2023-10-09 DIAGNOSIS — D508 Other iron deficiency anemias: Secondary | ICD-10-CM

## 2023-10-09 MED ORDER — IRON SUCROSE 20 MG/ML IV SOLN
200.0000 mg | Freq: Once | INTRAVENOUS | Status: AC
  Administered 2023-10-09: 200 mg via INTRAVENOUS
  Filled 2023-10-09: qty 10

## 2023-10-09 MED ORDER — DIPHENHYDRAMINE HCL 25 MG PO CAPS
25.0000 mg | ORAL_CAPSULE | Freq: Once | ORAL | Status: AC
  Administered 2023-10-09: 25 mg via ORAL
  Filled 2023-10-09: qty 1

## 2023-10-09 MED ORDER — ACETAMINOPHEN 325 MG PO TABS
650.0000 mg | ORAL_TABLET | Freq: Once | ORAL | Status: AC
  Administered 2023-10-09: 650 mg via ORAL
  Filled 2023-10-09: qty 2

## 2023-10-09 NOTE — Progress Notes (Signed)
 Diagnosis: Acute Anemia  Provider:  Chilton Greathouse MD  Procedure: IV Push  IV Type: Peripheral, IV Location: L Hand  Venofer (Iron Sucrose), Dose: 200 mg  Post Infusion IV Care: Observation period completed and Peripheral IV Discontinued  Discharge: Condition: Good, Destination: Home . AVS Declined  Performed by:  Nat Math, RN

## 2023-10-10 NOTE — Anesthesia Postprocedure Evaluation (Signed)
 Anesthesia Post Note  Patient: Victoria Chavez  Procedure(s) Performed: DILATATION AND CURETTAGE Larri Ply RESECTION POLYPECTOMY     Patient location during evaluation: PACU Anesthesia Type: General Level of consciousness: awake and alert Pain management: pain level controlled Vital Signs Assessment: post-procedure vital signs reviewed and stable Respiratory status: spontaneous breathing, nonlabored ventilation, respiratory function stable and patient connected to nasal cannula oxygen Cardiovascular status: blood pressure returned to baseline and stable Postop Assessment: no apparent nausea or vomiting Anesthetic complications: no   No notable events documented.  Last Vitals:  Vitals:   10/08/23 1200 10/08/23 1215  BP: (!) 154/97 (!) 165/95  Pulse: 68 67  Resp: 13 14  Temp: 36.7 C   SpO2: 94% 94%    Last Pain:  Vitals:   10/08/23 1200  TempSrc:   PainSc: 0-No pain                 Chasitty Hehl S

## 2023-10-15 ENCOUNTER — Inpatient Hospital Stay

## 2023-10-15 ENCOUNTER — Other Ambulatory Visit: Payer: Self-pay

## 2023-10-15 DIAGNOSIS — E538 Deficiency of other specified B group vitamins: Secondary | ICD-10-CM | POA: Diagnosis not present

## 2023-10-15 MED ORDER — CYANOCOBALAMIN 1000 MCG/ML IJ SOLN: INTRAMUSCULAR | 12 refills | Status: AC

## 2023-10-15 MED ORDER — CYANOCOBALAMIN 1000 MCG/ML IJ SOLN
1000.0000 ug | Freq: Once | INTRAMUSCULAR | Status: AC
  Administered 2023-10-15: 1000 ug via INTRAMUSCULAR
  Filled 2023-10-15: qty 1

## 2023-10-15 MED ORDER — LUER LOCK SAFETY SYRINGES 22G X 1-1/2" 3 ML MISC: 1 refills | Status: DC

## 2023-10-15 NOTE — Progress Notes (Signed)
 Verbal order w/readback from Lacie Burton, NP for Cyanocobalamin  1048mcg/ml SQ 1ml weekly x6wks w/1st injection given today in Flush and then monthly to be administered at home by pt.  Pt stated in Flush she is comfortable with giving this injection to herself at home.  Pt had discussed with Lacie and Cleda Curly, CMA previously (see notes from Cleda Curly).  Verbal order w/readback also given for syringes so pt could administer Vitamin B12 injection at home.  Prescriptions entered and sent to pt's Preferred pharmacy.

## 2023-10-22 ENCOUNTER — Inpatient Hospital Stay

## 2023-10-27 ENCOUNTER — Other Ambulatory Visit: Payer: Self-pay | Admitting: Nurse Practitioner

## 2023-10-29 ENCOUNTER — Inpatient Hospital Stay

## 2023-11-03 ENCOUNTER — Inpatient Hospital Stay: Attending: Hematology

## 2023-11-03 DIAGNOSIS — N920 Excessive and frequent menstruation with regular cycle: Secondary | ICD-10-CM | POA: Diagnosis present

## 2023-11-03 DIAGNOSIS — D500 Iron deficiency anemia secondary to blood loss (chronic): Secondary | ICD-10-CM | POA: Insufficient documentation

## 2023-11-03 DIAGNOSIS — D649 Anemia, unspecified: Secondary | ICD-10-CM

## 2023-11-03 DIAGNOSIS — E538 Deficiency of other specified B group vitamins: Secondary | ICD-10-CM | POA: Diagnosis present

## 2023-11-03 LAB — CBC WITH DIFFERENTIAL/PLATELET
Abs Immature Granulocytes: 0.01 K/uL (ref 0.00–0.07)
Basophils Absolute: 0 K/uL (ref 0.0–0.1)
Basophils Relative: 1 %
Eosinophils Absolute: 0.2 K/uL (ref 0.0–0.5)
Eosinophils Relative: 3 %
HCT: 35.2 % — ABNORMAL LOW (ref 36.0–46.0)
Hemoglobin: 10.6 g/dL — ABNORMAL LOW (ref 12.0–15.0)
Immature Granulocytes: 0 %
Lymphocytes Relative: 27 %
Lymphs Abs: 2 K/uL (ref 0.7–4.0)
MCH: 21.3 pg — ABNORMAL LOW (ref 26.0–34.0)
MCHC: 30.1 g/dL (ref 30.0–36.0)
MCV: 70.8 fL — ABNORMAL LOW (ref 80.0–100.0)
Monocytes Absolute: 0.7 K/uL (ref 0.1–1.0)
Monocytes Relative: 9 %
Neutro Abs: 4.6 K/uL (ref 1.7–7.7)
Neutrophils Relative %: 60 %
Platelets: 321 K/uL (ref 150–400)
RBC: 4.97 MIL/uL (ref 3.87–5.11)
RDW: 23.7 % — ABNORMAL HIGH (ref 11.5–15.5)
WBC: 7.6 K/uL (ref 4.0–10.5)
nRBC: 0 % (ref 0.0–0.2)

## 2023-11-03 LAB — VITAMIN B12: Vitamin B-12: 1275 pg/mL — ABNORMAL HIGH (ref 180–914)

## 2023-11-04 LAB — FERRITIN: Ferritin: 45 ng/mL (ref 11–307)

## 2023-11-05 ENCOUNTER — Inpatient Hospital Stay

## 2023-11-12 ENCOUNTER — Inpatient Hospital Stay

## 2023-11-19 ENCOUNTER — Inpatient Hospital Stay

## 2023-11-22 ENCOUNTER — Encounter (HOSPITAL_COMMUNITY): Payer: Self-pay

## 2023-11-22 ENCOUNTER — Ambulatory Visit (HOSPITAL_COMMUNITY): Admission: EM | Admit: 2023-11-22 | Discharge: 2023-11-22 | Disposition: A | Discharge status: Home / Self Care

## 2023-11-22 ENCOUNTER — Emergency Department (HOSPITAL_COMMUNITY)

## 2023-11-22 ENCOUNTER — Other Ambulatory Visit: Payer: Self-pay

## 2023-11-22 ENCOUNTER — Inpatient Hospital Stay (HOSPITAL_COMMUNITY)
Admission: EM | Admit: 2023-11-22 | Discharge: 2023-11-26 | DRG: 872 | Disposition: A | Discharge status: Home / Self Care | Source: Ambulatory Visit | Attending: Student in an Organized Health Care Education/Training Program | Admitting: Student in an Organized Health Care Education/Training Program

## 2023-11-22 DIAGNOSIS — L0291 Cutaneous abscess, unspecified: Secondary | ICD-10-CM

## 2023-11-22 DIAGNOSIS — N730 Acute parametritis and pelvic cellulitis: Secondary | ICD-10-CM | POA: Diagnosis present

## 2023-11-22 DIAGNOSIS — K439 Ventral hernia without obstruction or gangrene: Secondary | ICD-10-CM | POA: Diagnosis present

## 2023-11-22 DIAGNOSIS — D509 Iron deficiency anemia, unspecified: Secondary | ICD-10-CM | POA: Diagnosis present

## 2023-11-22 DIAGNOSIS — R112 Nausea with vomiting, unspecified: Secondary | ICD-10-CM

## 2023-11-22 DIAGNOSIS — A419 Sepsis, unspecified organism: Secondary | ICD-10-CM | POA: Diagnosis present

## 2023-11-22 DIAGNOSIS — Z87891 Personal history of nicotine dependence: Secondary | ICD-10-CM | POA: Diagnosis not present

## 2023-11-22 DIAGNOSIS — E876 Hypokalemia: Principal | ICD-10-CM | POA: Diagnosis present

## 2023-11-22 DIAGNOSIS — K436 Other and unspecified ventral hernia with obstruction, without gangrene: Secondary | ICD-10-CM

## 2023-11-22 DIAGNOSIS — Z6841 Body Mass Index (BMI) 40.0 and over, adult: Secondary | ICD-10-CM | POA: Diagnosis not present

## 2023-11-22 DIAGNOSIS — I10 Essential (primary) hypertension: Secondary | ICD-10-CM | POA: Diagnosis present

## 2023-11-22 DIAGNOSIS — N7093 Salpingitis and oophoritis, unspecified: Secondary | ICD-10-CM | POA: Diagnosis present

## 2023-11-22 DIAGNOSIS — Z8679 Personal history of other diseases of the circulatory system: Secondary | ICD-10-CM | POA: Diagnosis not present

## 2023-11-22 DIAGNOSIS — Z79899 Other long term (current) drug therapy: Secondary | ICD-10-CM | POA: Diagnosis not present

## 2023-11-22 DIAGNOSIS — K420 Umbilical hernia with obstruction, without gangrene: Secondary | ICD-10-CM

## 2023-11-22 DIAGNOSIS — R109 Unspecified abdominal pain: Secondary | ICD-10-CM | POA: Diagnosis present

## 2023-11-22 LAB — CBC WITH DIFFERENTIAL/PLATELET
Abs Immature Granulocytes: 0.18 K/uL — ABNORMAL HIGH (ref 0.00–0.07)
Basophils Absolute: 0 K/uL (ref 0.0–0.1)
Basophils Relative: 0 %
Eosinophils Absolute: 0 K/uL (ref 0.0–0.5)
Eosinophils Relative: 0 %
HCT: 37.5 % (ref 36.0–46.0)
Hemoglobin: 11.4 g/dL — ABNORMAL LOW (ref 12.0–15.0)
Immature Granulocytes: 1 %
Lymphocytes Relative: 8 %
Lymphs Abs: 1.6 K/uL (ref 0.7–4.0)
MCH: 22.2 pg — ABNORMAL LOW (ref 26.0–34.0)
MCHC: 30.4 g/dL (ref 30.0–36.0)
MCV: 73 fL — ABNORMAL LOW (ref 80.0–100.0)
Monocytes Absolute: 2.3 K/uL — ABNORMAL HIGH (ref 0.1–1.0)
Monocytes Relative: 11 %
Neutro Abs: 17.1 K/uL — ABNORMAL HIGH (ref 1.7–7.7)
Neutrophils Relative %: 80 %
Platelets: 487 K/uL — ABNORMAL HIGH (ref 150–400)
RBC: 5.14 MIL/uL — ABNORMAL HIGH (ref 3.87–5.11)
RDW: 19.9 % — ABNORMAL HIGH (ref 11.5–15.5)
WBC: 21.3 K/uL — ABNORMAL HIGH (ref 4.0–10.5)
nRBC: 0 % (ref 0.0–0.2)

## 2023-11-22 LAB — COMPREHENSIVE METABOLIC PANEL WITH GFR
ALT: 20 U/L (ref 0–44)
AST: 20 U/L (ref 15–41)
Albumin: 2.8 g/dL — ABNORMAL LOW (ref 3.5–5.0)
Alkaline Phosphatase: 164 U/L — ABNORMAL HIGH (ref 38–126)
Anion gap: 12 (ref 5–15)
BUN: 7 mg/dL (ref 6–20)
CO2: 23 mmol/L (ref 22–32)
Calcium: 8.9 mg/dL (ref 8.9–10.3)
Chloride: 102 mmol/L (ref 98–111)
Creatinine, Ser: 0.93 mg/dL (ref 0.44–1.00)
GFR, Estimated: >60 mL/min (ref >60)
Glucose, Bld: 127 mg/dL — ABNORMAL HIGH (ref 70–99)
Potassium: 2.9 mmol/L — ABNORMAL LOW (ref 3.5–5.1)
Sodium: 137 mmol/L (ref 135–145)
Total Bilirubin: 0.8 mg/dL (ref 0.0–1.2)
Total Protein: 7.7 g/dL (ref 6.5–8.1)

## 2023-11-22 LAB — WET PREP, GENITAL
Sperm: NONE SEEN
Trich, Wet Prep: NONE SEEN
WBC, Wet Prep HPF POC: >=10 — AB (ref <10)
Yeast Wet Prep HPF POC: NONE SEEN

## 2023-11-22 LAB — LIPASE, BLOOD: Lipase: 17 U/L (ref 11–51)

## 2023-11-22 LAB — HCG, SERUM, QUALITATIVE: Preg, Serum: NEGATIVE

## 2023-11-22 MED ORDER — IOHEXOL 350 MG/ML SOLN
100.0000 mL | Freq: Once | INTRAVENOUS | Status: AC | PRN
  Administered 2023-11-22: 100 mL via INTRAVENOUS

## 2023-11-22 MED ORDER — POTASSIUM CHLORIDE CRYS ER 20 MEQ PO TBCR
20.0000 meq | EXTENDED_RELEASE_TABLET | Freq: Once | ORAL | Status: AC
  Administered 2023-11-22: 20 meq via ORAL
  Filled 2023-11-22: qty 1

## 2023-11-22 MED ORDER — ACETAMINOPHEN 650 MG RE SUPP: 650.0000 mg | Freq: Four times a day (QID) | RECTAL | Status: DC | PRN

## 2023-11-22 MED ORDER — KETOROLAC TROMETHAMINE 15 MG/ML IJ SOLN
15.0000 mg | Freq: Four times a day (QID) | INTRAMUSCULAR | Status: DC | PRN
  Administered 2023-11-24 – 2023-11-25 (×3): 15 mg via INTRAVENOUS
  Filled 2023-11-22 (×3): qty 1

## 2023-11-22 MED ORDER — METRONIDAZOLE 500 MG/100ML IV SOLN
500.0000 mg | Freq: Two times a day (BID) | INTRAVENOUS | Status: DC
  Administered 2023-11-23 – 2023-11-26 (×7): 500 mg via INTRAVENOUS
  Filled 2023-11-22 (×7): qty 100

## 2023-11-22 MED ORDER — SODIUM CHLORIDE 0.9 % IV SOLN
100.0000 mg | Freq: Once | INTRAVENOUS | Status: AC
  Administered 2023-11-22: 100 mg via INTRAVENOUS
  Filled 2023-11-22: qty 100

## 2023-11-22 MED ORDER — LACTATED RINGERS IV SOLN: INTRAVENOUS | Status: AC

## 2023-11-22 MED ORDER — MELATONIN 3 MG PO TABS: 3.0000 mg | ORAL_TABLET | Freq: Every evening | ORAL | Status: DC | PRN

## 2023-11-22 MED ORDER — METRONIDAZOLE 500 MG/100ML IV SOLN
500.0000 mg | Freq: Once | INTRAVENOUS | Status: AC
  Administered 2023-11-22: 500 mg via INTRAVENOUS
  Filled 2023-11-22: qty 100

## 2023-11-22 MED ORDER — SODIUM CHLORIDE 0.9 % IV BOLUS
1000.0000 mL | Freq: Once | INTRAVENOUS | Status: AC
  Administered 2023-11-22: 1000 mL via INTRAVENOUS

## 2023-11-22 MED ORDER — ONDANSETRON HCL 4 MG/2ML IJ SOLN: 4.0000 mg | Freq: Four times a day (QID) | INTRAMUSCULAR | Status: DC | PRN

## 2023-11-22 MED ORDER — SODIUM CHLORIDE 0.9 % IV SOLN
2.0000 g | INTRAVENOUS | Status: DC
  Administered 2023-11-22 – 2023-11-26 (×5): 2 g via INTRAVENOUS
  Filled 2023-11-22 (×5): qty 20

## 2023-11-22 MED ORDER — HYDROMORPHONE HCL 1 MG/ML IJ SOLN
1.0000 mg | Freq: Once | INTRAMUSCULAR | Status: AC
  Administered 2023-11-22: 1 mg via INTRAVENOUS
  Filled 2023-11-22: qty 1

## 2023-11-22 MED ORDER — NALOXONE HCL 0.4 MG/ML IJ SOLN: 0.4000 mg | INTRAMUSCULAR | Status: DC | PRN

## 2023-11-22 MED ORDER — POTASSIUM CHLORIDE 10 MEQ/100ML IV SOLN
10.0000 meq | INTRAVENOUS | Status: AC
  Administered 2023-11-22 – 2023-11-23 (×4): 10 meq via INTRAVENOUS
  Filled 2023-11-22 (×4): qty 100

## 2023-11-22 MED ORDER — ACETAMINOPHEN 325 MG PO TABS
650.0000 mg | ORAL_TABLET | Freq: Four times a day (QID) | ORAL | Status: DC | PRN
Start: 2023-11-22 — End: 2023-11-26

## 2023-11-22 MED ORDER — HYDROMORPHONE HCL 1 MG/ML IJ SOLN
0.5000 mg | INTRAMUSCULAR | Status: DC | PRN
  Administered 2023-11-22 – 2023-11-24 (×5): 0.5 mg via INTRAVENOUS
  Filled 2023-11-22 (×5): qty 0.5

## 2023-11-22 MED ORDER — MORPHINE SULFATE (PF) 4 MG/ML IV SOLN
4.0000 mg | Freq: Once | INTRAVENOUS | Status: AC
  Administered 2023-11-22: 4 mg via INTRAVENOUS
  Filled 2023-11-22: qty 1

## 2023-11-22 MED ORDER — SODIUM CHLORIDE 0.9 % IV SOLN
100.0000 mg | Freq: Two times a day (BID) | INTRAVENOUS | Status: DC
  Administered 2023-11-23 – 2023-11-26 (×8): 100 mg via INTRAVENOUS
  Filled 2023-11-22 (×10): qty 100

## 2023-11-22 NOTE — ED Triage Notes (Signed)
 Pt to ED from UC c/o incarcerated umbilical hernia. Reports no BM in 4 days and emesis x 4 days.

## 2023-11-22 NOTE — ED Notes (Signed)
 Patient is being discharged from the Urgent Care and sent to the Emergency Department via POV . Per Dorna Silk, FNP, patient is in need of higher level of care due to incarcerated umbilical hernia. Patient is aware and verbalizes understanding of plan of care.  Vitals:   11/22/23 1113  Pulse: (!) 108  Resp: 18  Temp: 98.6 F (37 C)  SpO2: 98%

## 2023-11-22 NOTE — ED Triage Notes (Signed)
 Pt c/o constipation worsening umbilical hernia and vomiting for 4 days. States she has had the hernia for awhile but over last 4 days it has gotten bigger and warm and she cannot have BM or keep anything down

## 2023-11-22 NOTE — ED Provider Notes (Addendum)
 [facility]    CSN: [id number] Arrival date & time: 11/22/23  1048      History   Chief Complaint Chief Complaint  Patient presents with   Hernia   Constipation    HPI Victoria Chavez is a 43 y.o. female.   Victoria Chavez is a 43 y.o. female presenting for chief complaint of Hernia and Constipation.  History of umbilical hernia, states the hernia has grown in size and warmth significantly over the last 4 days.  She has not had a normal bowel movement in the last 4 days and states her stools have been jelly in appearance.  Urinating normally without difficulty or dysuria/frequency.  She has not noticed any blood or mucus in her stools.  She has had multiple episodes of nausea and vomiting over the last 4 days and has been able to keep down sips of fluids without vomiting in the last 4 hours.  Emesis is nonbloody/nonbilious.  She is unable to keep down solid foods without vomiting. History of incarcerated umbilical hernia repair x 2 last performed in 2021.  Umbilical hernia returned several months ago, she has discussed this with her family practice provider and GI but they were waiting to repair this until she was able to have bariatric surgery in one procedure instead of separate procedures.  Hernia has become incarcerated over the last 4 days. Denies fever, chills, flank pain, and blood in the stools.  She tried taking milk of magnesia without relief.   Constipation   Past Medical History:  Diagnosis Date   Abdominal hernia    Dyspnea    albuterol  inhaler prn   HTN (hypertension), benign    IDA (iron  deficiency anemia) 10/02/2023   Morbid obesity (HCC)    Sleep apnea 2007   does not use CPAP    Patient Active Problem List   Diagnosis Date Noted   B12 deficiency 10/07/2023   Iron  deficiency anemia 09/04/2023   Gonorrhea 10/28/2019   Ileus (HCC) 10/27/2019   Pelvic abscess in female 10/26/2019   Trichomoniasis 10/26/2019   Anemia 10/26/2019    Incarcerated umbilical hernia 08/12/2019   Morbid obesity (HCC) 02/18/2017   Umbilical hernia 02/18/2017   Tobacco user 02/18/2017    Past Surgical History:  Procedure Laterality Date   HERNIA REPAIR  2013   with mesh   HYSTEROSCOPY WITH D & C N/A 10/08/2023   Procedure: DILATATION AND CURETTAGE /HYSTEROSCOPY;  Surgeon: Rosalva Sawyer, MD;  Location: [facility] OR;  Service: Gynecology;  Laterality: N/A;   INCISIONAL HERNIA REPAIR N/A 08/12/2019   Procedure: Recurrent incisional hernia repair;  Surgeon: Rubin Calamity, MD;  Location: [facility] OR;  Service: General;  Laterality: N/A;   LAPAROTOMY N/A 08/12/2019   Procedure: EXPLORATORY LAPAROTOMY;  Surgeon: Rubin Calamity, MD;  Location: [facility] OR;  Service: General;  Laterality: N/A;   MELINDA RESECTION N/A 10/08/2023   Procedure: MELINDA RESECTION POLYPECTOMY;  Surgeon: Rosalva Sawyer, MD;  Location: [facility] OR;  Service: Gynecology;  Laterality: N/A;   OMENTECTOMY N/A 08/12/2019   Procedure: Partial Omentectomy;  Surgeon: Rubin Calamity, MD;  Location: [facility] OR;  Service: General;  Laterality: N/A;    OB History     Gravida  0   Para  0   Term  0   Preterm  0   AB  0   Living  0      SAB  0   IAB  0   Ectopic  0   Multiple  0  Live Births  0            Home Medications    Prior to Admission medications   Medication Sig Start Date End Date Taking? Authorizing Provider  albuterol  (PROVENTIL  HFA;VENTOLIN  HFA) 108 (90 BASE) MCG/ACT inhaler Inhale 2 puffs into the lungs every 6 (six) hours as needed. For shortness of breath    [provider]  amLODipine  (NORVASC ) 5 MG tablet Take 1 tablet (5 mg total) by mouth daily. 10/29/19   Izell Harari, MD  cyanocobalamin  (VITAMIN B12) 1000 MCG/ML injection Inject 1 mL (1,000 mcg total) into the muscle once a week for 35 days, THEN 1 mL (1,000 mcg total) every 30 (thirty) days. 10/15/23 10/14/24  Burton, Lacie K, NP  ibuprofen  (ADVIL ) 800 MG tablet Take 1 tablet (800 mg total) by mouth  every 8 (eight) hours as needed for mild pain (pain score 1-3). 10/08/23   Rosalva Sawyer, MD  Loperamide HCl (IMODIUM PO) Take 2 tablets by mouth as needed (for diarrhea).    [provider]  spironolactone (ALDACTONE) 25 MG tablet Take 25 mg by mouth daily. 03/13/23   [provider]  SYRINGE-NEEDLE, DISP, 3 ML (LUER LOCK SAFETY SYRINGES) 22G X 1-1/2 3 ML MISC 1 Syringe by Does not apply route once a week for 35 days, THEN 1 Syringe every 30 (thirty) days. Used to administer Cyanocobalamin  (Vitamin B12) injection. 10/15/23 10/14/24  Burton, Lacie K, NP    Family History No family history on file.  Social History Social History   Tobacco Use   Smoking status: Former    Current packs/day: 0.50    Types: Cigarettes   Smokeless tobacco: Never   Tobacco comments:    Quit cigarettes in 2021  Vaping Use   Vaping status: Never Used  Substance Use Topics   Alcohol use: Yes    Alcohol/week: 1.0 - 2.0 standard drink of alcohol    Types: 1 - 2 Glasses of wine per week    Comment: wine   Drug use: Yes    Frequency: 14.0 times per week    Types: Marijuana    Comment: Last use 10/07/23, informed to withhold until after surgery     Allergies   Patient has no known allergies.   Review of Systems Review of Systems  Gastrointestinal:  Positive for constipation.  Per HPI   Physical Exam Triage Vital Signs ED Triage Vitals  Encounter Vitals Group     BP --      Girls Systolic BP Percentile --      Girls Diastolic BP Percentile --      Boys Systolic BP Percentile --      Boys Diastolic BP Percentile --      Pulse Rate 11/22/23 1113 (!) 108     Resp 11/22/23 1113 18     Temp 11/22/23 1113 98.6 F (37 C)     Temp Source 11/22/23 1113 Oral     SpO2 11/22/23 1113 98 %     Weight --      Height --      Head Circumference --      Peak Flow --      Pain Score 11/22/23 1117 10     Pain Loc --      Pain Education --      Exclude from Growth Chart --    No data  found.  Updated Vital Signs Pulse (!) 108   Temp 98.6 F (37 C) (Oral)  Resp 18   LMP 11/10/2023 (Exact Date)   SpO2 98%   Visual Acuity Right Eye Distance:   Left Eye Distance:   Bilateral Distance:    Right Eye Near:   Left Eye Near:    Bilateral Near:     Physical Exam Vitals and nursing note reviewed.  Constitutional:      Appearance: She is not ill-appearing or toxic-appearing.  HENT:     Head: Normocephalic and atraumatic.     Right Ear: Hearing and external ear normal.     Left Ear: Hearing and external ear normal.     Nose: Nose normal.     Mouth/Throat:     Lips: Pink.  Eyes:     General: Lids are normal. Vision grossly intact. Gaze aligned appropriately.     Extraocular Movements: Extraocular movements intact.     Conjunctiva/sclera: Conjunctivae normal.  Pulmonary:     Effort: Pulmonary effort is normal.  Abdominal:     General: Bowel sounds are absent.     Palpations: Abdomen is soft.     Tenderness: There is abdominal tenderness in the periumbilical area. There is no right CVA tenderness, left CVA tenderness or guarding.     Hernia: A hernia is present. Hernia is present in the umbilical area (large umbilical hernia on exam).     Comments: See image below of ventral hernia.   Musculoskeletal:     Cervical back: Neck supple.  Skin:    General: Skin is warm and dry.     Capillary Refill: Capillary refill takes less than 2 seconds.     Findings: No rash.  Neurological:     General: No focal deficit present.     Mental Status: She is alert and oriented to person, place, and time. Mental status is at baseline.     Cranial Nerves: No dysarthria or facial asymmetry.  Psychiatric:        Mood and Affect: Mood normal.        Speech: Speech normal.        Behavior: Behavior normal.        Thought Content: Thought content normal.        Judgment: Judgment normal.    Incarcerated umbilical hernia   UC Treatments / Results  Labs (all labs ordered are  listed, but only abnormal results are displayed) Labs Reviewed - No data to display  EKG   Radiology   Procedures Procedures (including critical care time)  Medications Ordered in UC Medications - No data to display  Initial Impression / Assessment and Plan / UC Course  I have reviewed the triage vital signs and the nursing notes.  Pertinent labs & imaging results that were available during my care of the patient were reviewed by me and considered in my medical decision making (see chart for details).   1.  Incarcerated umbilical hernia, nausea with vomiting Incarcerated umbilical hernia on exam. This is going to require an emergency department evaluation and workup and likely surgical evaluation/intervention on an urgent basis. Discussed concerning findings with patient who agrees to proceed to the nearest emergency room for further workup and evaluation. Vital signs are hemodynamically stable, she is safe for transport to the ER via private car. Verbal contract given to proceed directly to the emergency room.   Final Clinical Impressions(s) / UC Diagnoses   Final diagnoses:  Nausea and vomiting, unspecified vomiting type  Irreducible ventral hernia   Discharge Instructions   None    ED  Prescriptions   None    PDMP not reviewed this encounter.   Enedelia Dorna HERO, FNP 11/22/23 1216    Enedelia Dorna [place], [place] 11/22/23 1635

## 2023-11-22 NOTE — ED Provider Notes (Signed)
 Clinical Course as of 11/22/23 1929  Sat Nov 22, 2023  1754 Pt reassessed - still having abdominal pain.  Will give dilaudid .  Left page with [facility] provider now, who is in a procedure, regarding possible TOA.  Pt completing antibiotics and in agreement with admission.  Per general surgeon, hernia could be repaired outpatient if pt tolerating PO. [MT]  1928 Dr Henry [facility] coverage agrees with medical admission on antibiotics; they will consult tomorrow; if pt failing to improve after 2-3 days of antibiotics, could consider IR drainage of fluid collection [MT]    Clinical Course User Index [MT] Cottie Donnice PARAS, MD   Will admit to [facility] inpatient hospital service on antibiotics  Gen surgery and OBGYN both contacted to follow as consultants inpatient  Consider IR drainage of fluid collection tomorrow vs watchful waiting with antibiotics inpatient   Cottie Donnice PARAS, MD 11/22/23 1930

## 2023-11-22 NOTE — ED Notes (Signed)
 [facility] care pf pt

## 2023-11-22 NOTE — H&P (Signed)
 History and Physical      Victoria Chavez FMW:[id number] DOB: Apr 23, 1981 DOA: 11/22/2023; DOS: 11/22/2023  PCP: Ilah Crigler, MD  Patient coming from: home   I have personally briefly reviewed patient's old medical records in [facility]  Chief Complaint: Abdominal pain  HPI: Victoria Chavez is a 43 y.o. female with medical history significant for ventral hernia status post mesh hernia repair in 2013 with recurrent incisional hernia repair in April 2021, essential hypertension, chronic iron  deficiency anemia with baseline hemoglobin 8.5-11, who is admitted to [facility] on 11/22/2023 with sepsis due to pelvic inflammatory disease associated with left-sided tubule ovarian abscess after presenting from home to [facility] ED complaining of abdominal pain.   The patient reports 4 days of progressive sharp lower abdominal discomfort, worse with palpation.  Has been associated with intermittent nausea resulting in 3-4 episodes of nonbloody, nonbilious emesis over this timeframe.  Not associate with any melena or hematochezia.  She notes associated subjective fever in the absence of chills, will by rigors, or generalized myalgias.  Patient has noted recent development of vaginal drainage, no recent dysuria.  No recent chest pain , shortness of breath, or cough.   She has a history of previous ventral hernias, status post mesh hernia repair in 2013 followed by recurrent incisional hernia repair via Dr. Rubin In April 2021.    ED Course:  Vital signs in the ED were notable for the following: Temperature max 99.1; heart rates in the low 100s; systolic blood pressures in the low 100s to 140s; respiratory rate 17-23, oxygen saturation 97 to 100% on room air.  Labs were notable for the following: CMP was notable for the following: Potassium 2.9, bicarbonate 23, creatinine 0.93, glucose 127, alkaline phosphatase 164.  Otherwise, liver enzymes are within normal limits.  Lipase 17.  CBC notable  for white cell count 21,300 with 80% neutrophils, hemoglobin 11.4 associated with microcytic finding as well as elevated RDW and normochromic result, relative to most recent prior hemoglobin value of 10.6 on 11/03/2023, but 487.  Blood culture was collected prior to initiation of IV antibiotics, as below.  Urinalysis has been ordered, with result currently pending.  Qualitative serum hCG was negative.  RPR pending.  Additionally, EDP performed speculum/cervical exam, during which time significant vaginal drainage was noted.  Associated wet prep and gonorrhea/chlamydia probe are currently pending.  Per my interpretation, EKG in ED demonstrated the following: Sinus tachycardia with heart rate 105, normal intervals, nonspecific T wave inversion in lead III, and no evidence of ST changes, including no evidence of ST elevation.  Imaging in the ED, per corresponding formal radiology read, was notable for the following: CT of the abdomen/pelvis with contrast, in comparison to most recent prior CT abdomen/pelvis from 08/12/2023 shows interval development of a large complex fluid collection in the cul-de-sac of the pelvis, measuring 13 x 8 cm, concerning for tubo-ovarian abscess.  Additionally, CT abdomen/pelvis shows moderate-sized supraumbilical ventral hernia and large umbilical ventral hernia, with these hernias containing the right colon, transverse colon, and numerous loops of small bowel, without evidence of strangulation or bowel obstruction.  This imaging also showed cholelithiasis without evidence of acute cholecystitis or evidence of choledocholithiasis.  Additionally, the patient underwent transabdominal and trans vaginal ultrasound which showed complex low-attenuation mass seen in the left adnexa/left paraovarian region, measuring 7.9 x 6.9 x 6.3 cm, which, per radiology report, may represent tubo-ovarian abscess, with differential also including complex versus hemorrhagic cyst versus endometrioma.  No  evidence of ovarian torsion.  EDP d/w on-call Margarete Kettle, Dr. Henry, who will formally consult and see patient in the AM. She agrees with existing antibiotic regimen of Rocephin , doxycycline , and Flagyl , and recommends holding off on IR drainage of the abscess for 2-3 days to allow time for abx to take effect. EDP discussed pt's large ventral hernia with on-call general surgery, Dr. Rubin, who has consulted. From the perspective of the ventral hernia, he recommended conservative measures for now, PO challenge, pain control.  He conveyed that general surgery will continue to follow during this hospitalization and noted no indication for urgent/emergent surgical intervention at this time.   While in the ED, the following were administered: Dilaudid  1 mg IV x 1 dose, morphine  4 mg IV x 1 dose, Rocephin , doxycycline , IV Flagyl .  Normal saline x 1 L bolus.  Subsequently, the patient was admitted for further evaluation management of presenting sepsis due to PID in the setting of suspected left tubo-ovarian abscess, also noting the presence of supraumbilical ventral hernia as well as umbilical ventral hernia, and with labs notable for hypokalemia.     Review of Systems: As per HPI otherwise 10 point review of systems negative.   Past Medical History:  Diagnosis Date   Abdominal hernia    Dyspnea    albuterol  inhaler prn   HTN (hypertension), benign    IDA (iron  deficiency anemia) 10/02/2023   Morbid obesity (HCC)    Sleep apnea 2007   does not use CPAP    Past Surgical History:  Procedure Laterality Date   HERNIA REPAIR  2013   with mesh   HYSTEROSCOPY WITH D & C N/A 10/08/2023   Procedure: DILATATION AND CURETTAGE /HYSTEROSCOPY;  Surgeon: Rosalva Sawyer, MD;  Location: [facility] OR;  Service: Gynecology;  Laterality: N/A;   INCISIONAL HERNIA REPAIR N/A 08/12/2019   Procedure: Recurrent incisional hernia repair;  Surgeon: Rubin Calamity, MD;  Location: [facility] OR;  Service: General;  Laterality: N/A;    LAPAROTOMY N/A 08/12/2019   Procedure: EXPLORATORY LAPAROTOMY;  Surgeon: Rubin Calamity, MD;  Location: [facility] OR;  Service: General;  Laterality: N/A;   MELINDA RESECTION N/A 10/08/2023   Procedure: MELINDA RESECTION POLYPECTOMY;  Surgeon: Rosalva Sawyer, MD;  Location: [facility] OR;  Service: Gynecology;  Laterality: N/A;   OMENTECTOMY N/A 08/12/2019   Procedure: Partial Omentectomy;  Surgeon: Rubin Calamity, MD;  Location: [facility] OR;  Service: General;  Laterality: N/A;    Social History:  reports that she has quit smoking. Her smoking use included cigarettes. She has never used smokeless tobacco. She reports current alcohol use of about 1.0 - 2.0 standard drink of alcohol per week. She reports current drug use. Frequency: 14.00 times per week. Drug: Marijuana.   No Known Allergies  History reviewed. No pertinent family history.   Prior to Admission medications   Medication Sig Start Date End Date Taking? Authorizing Provider  albuterol  (PROVENTIL  HFA;VENTOLIN  HFA) 108 (90 BASE) MCG/ACT inhaler Inhale 2 puffs into the lungs every 6 (six) hours as needed. For shortness of breath   Yes [provider]  amLODipine  (NORVASC ) 5 MG tablet Take 1 tablet (5 mg total) by mouth daily. 10/29/19  Yes Izell Harari, MD  cyanocobalamin  (VITAMIN B12) 1000 MCG/ML injection Inject 1 mL (1,000 mcg total) into the muscle once a week for 35 days, THEN 1 mL (1,000 mcg total) every 30 (thirty) days. 10/15/23 10/14/24 Yes Burton, Lacie K, NP  ibuprofen  (ADVIL ) 800 MG tablet Take 1 tablet (800 mg total)  by mouth every 8 (eight) hours as needed for mild pain (pain score 1-3). 10/08/23  Yes Rosalva Sawyer, MD  Loperamide HCl (IMODIUM PO) Take 2 tablets by mouth as needed (for diarrhea).   Yes [provider]  spironolactone (ALDACTONE) 25 MG tablet Take 25 mg by mouth daily. 03/13/23  Yes [provider]  SYRINGE-NEEDLE, DISP, 3 ML (LUER LOCK SAFETY SYRINGES) 22G X 1-1/2 3 ML MISC 1 Syringe by Does not  apply route once a week for 35 days, THEN 1 Syringe every 30 (thirty) days. Used to administer Cyanocobalamin  (Vitamin B12) injection. 10/15/23 10/14/24  Burton, Lacie K, NP     Objective    Physical Exam: Vitals:   11/22/23 1725 11/22/23 1730 11/22/23 1800 11/22/23 1830  BP: 110/61 113/72 105/75 117/71  Pulse: (!) 106 (!) 108 (!) 104 (!) 103  Resp: 18 (!) 23 (!) 25 (!) 24  Temp:      TempSrc:      SpO2: 99% 99% 97% 94%  Weight:      Height:        General: appears to be stated age; alert, oriented Skin: warm, dry, no rash Head:  AT/ Mouth:  Oral mucosa membranes appear dry, normal dentition Neck: supple; trachea midline Heart: Mildly tachycardic, the regular; did not appreciate any M/R/G Lungs: CTAB, did not appreciate any wheezes, rales, or rhonchi Abdomen: + BS; soft, hernias noted, tenderness to palpation, particularly in the bilateral lower abdominal quadrants, in the absence of associated guarding, rigidity, or rebound tenderness Vascular: 2+ pedal pulses b/l; 2+ radial pulses b/l Extremities: no peripheral edema, no muscle wasting    Labs on Admission: I have personally reviewed following labs and imaging studies  CBC: Recent Labs  Lab 11/22/23 1259  WBC 21.3*  NEUTROABS 17.1*  HGB 11.4*  HCT 37.5  MCV 73.0*  PLT 487*   Basic Metabolic Panel: Recent Labs  Lab 11/22/23 1259  NA 137  K 2.9*  CL 102  CO2 23  GLUCOSE 127*  BUN 7  CREATININE 0.93  CALCIUM 8.9   GFR: Estimated Creatinine Clearance: 117.7 mL/min (by C-G formula based on SCr of 0.93 mg/dL). Liver Function Tests: Recent Labs  Lab 11/22/23 1259  AST 20  ALT 20  ALKPHOS 164*  BILITOT 0.8  PROT 7.7  ALBUMIN 2.8*   Recent Labs  Lab 11/22/23 1259  LIPASE 17   No results for input(s): AMMONIA in the last 168 hours. Coagulation Profile: No results for input(s): INR, PROTIME in the last 168 hours. Cardiac Enzymes: No results for input(s): CKTOTAL, CKMB, CKMBINDEX,  TROPONINI in the last 168 hours. BNP (last 3 results) No results for input(s): PROBNP in the last 8760 hours. HbA1C: No results for input(s): HGBA1C in the last 72 hours. CBG: No results for input(s): GLUCAP in the last 168 hours. Lipid Profile: No results for input(s): CHOL, HDL, LDLCALC, TRIG, CHOLHDL, LDLDIRECT in the last 72 hours. Thyroid Function Tests: No results for input(s): TSH, T4TOTAL, FREET4, T3FREE, THYROIDAB in the last 72 hours. Anemia Panel: No results for input(s): VITAMINB12, FOLATE, FERRITIN, TIBC, IRON , RETICCTPCT in the last 72 hours. Urine analysis:    Component Value Date/Time   COLORURINE YELLOW 01/18/2021 0639   APPEARANCEUR CLEAR 01/18/2021 0639   LABSPEC 1.025 01/18/2021 0639   PHURINE 6.0 01/18/2021 0639   GLUCOSEU NEGATIVE 01/18/2021 0639   HGBUR NEGATIVE 01/18/2021 0639   BILIRUBINUR NEGATIVE 01/18/2021 0639   KETONESUR NEGATIVE 01/18/2021 0639   PROTEINUR NEGATIVE 01/18/2021 9360  NITRITE NEGATIVE 01/18/2021 0639   LEUKOCYTESUR SMALL (A) 01/18/2021 0639    Radiological Exams on Admission: US  PELVIC COMPLETE W TRANSVAGINAL AND TORSION R/O Result Date: 11/22/2023 CLINICAL DATA:  Pain EXAM: TRANSABDOMINAL AND TRANSVAGINAL ULTRASOUND OF PELVIS TECHNIQUE: Both transabdominal and transvaginal ultrasound examinations of the pelvis were performed. Transabdominal technique was performed for global imaging of the pelvis including uterus, ovaries, adnexal regions, and pelvic cul-de-sac. It was necessary to proceed with endovaginal exam following the transabdominal exam to visualize the ovaries. COMPARISON:  CT abdomen and pelvis 11/22/2023. FINDINGS: Uterus Measurements: 7.3 x 2.7 x 3.5 cm = volume: 35 mL. Limited evaluation. No fibroids or other mass visualized. Endometrium Thickness: 9.5 mm.  No focal abnormality visualized. Right ovary Not visualized. Left ovary Measurements: 4.4 x 3.9 x 2.4 cm = volume: 21 mL. Complex  low-attenuation mass containing septations and internal debris seen in the left adnexa/left paraovarian region measuring 7.9 x 6.9 x 6.3 cm. Vascular flow is identified in the left ovary. Other findings No abnormal free fluid. IMPRESSION: 1. Complex low-attenuation mass containing septations and internal debris seen in the left adnexa/left paraovarian region measuring 7.9 x 6.9 x 6.3 cm. Findings are indeterminate and may represent tubo-ovarian abscess, although endometrioma and complex or hemorrhagic cysts are also in the differential. 2. Right ovary not visualized. Electronically Signed   By: Greig Pique M.D.   On: 11/22/2023 17:43   CT ABDOMEN PELVIS W CONTRAST Result Date: 11/22/2023 CLINICAL DATA:  Bowel obstruction suspected Hernia suspected, abdominal wall. No bowel movement in 4 days. Vomiting. EXAM: CT ABDOMEN AND PELVIS WITH CONTRAST TECHNIQUE: Multidetector CT imaging of the abdomen and pelvis was performed using the standard protocol following bolus administration of intravenous contrast. RADIATION DOSE REDUCTION: This exam was performed according to the departmental dose-optimization program which includes automated exposure control, adjustment of the mA and/or kV according to patient size and/or use of iterative reconstruction technique. CONTRAST:  OMNIPAQUE  IOHEXOL  350 MG/ML SOLN COMPARISON:  08/12/2023 FINDINGS: Lower chest: No acute findings Hepatobiliary: Multiple gallstones within the gallbladder. No CT evidence of acute cholecystitis. No focal hepatic abnormality. Pancreas: No focal abnormality or ductal dilatation. Spleen: No focal abnormality.  Normal size. Adrenals/Urinary Tract: Adrenal glands normal. 10 mm left lower pole nonobstructing renal stone. No ureteral stones or hydronephrosis. Urinary bladder decompressed, grossly unremarkable. Stomach/Bowel: Left colonic diverticulosis. No active diverticulitis. Stomach and small bowel decompressed. Supra umbilical ventral hernia  present containing transverse colon. There is a large umbilical hernia which contains transverse colon and right colon as well as numerous small bowel loops. No evidence of bowel obstruction. Vascular/Lymphatic: No aneurysm. Shotty retroperitoneal lymph nodes have increased since prior study, with short axis diameter measuring up to 9 mm. These are likely reactive. Reproductive: Large complex fluid collection noted in the pelvis which appears to be posterior to the uterus in the cul-de-sac. This entire complex area measures 13 x 8 cm. This is concerning for abscess. Neither ovary definitively visualized. Other: No free fluid or free air. Musculoskeletal: No acute bony abnormality. IMPRESSION: Large complex fluid collection in the cul-de-sac of the pelvis measuring 13 x 8 cm, new since prior study. This is concerning for tubo-ovarian abscess. Moderate-sized supraumbilical ventral hernia and large umbilical ventral hernia. Hernias contain right colon, transverse colon and numerous small bowel loops. No evidence of bowel obstruction. Cholelithiasis. Shotty retroperitoneal adenopathy, likely reactive. Electronically Signed   By: Franky Crease M.D.   On: 11/22/2023 15:16      Assessment/Plan   Principal  Problem:   Tubal ovarian abscess Active Problems:   Ventral hernia   Chronic iron  deficiency anemia   Sepsis (HCC)   PID (acute pelvic inflammatory disease)   Hypokalemia   History of essential hypertension    #) Sepsis due to pelvic inflammatory disease as a result of left tubo-ovarian abscess: In the setting of 4 days of progressive lower abdominal discomfort associated with subjective fever, with  today's CT abdomen/pelvis shows new finding of large complex fluid collection in the cul-de-sac of the pelvis concerning for tubo-ovarian abscess, with transabdominal/transvaginal ultrasound showing complex low-attenuation mass in the left adnexa/left paraovarian region concerning for tubo-ovarian abscess.   Qualitative serum hCG was negative.  Additionally, speculum/cervical exam performed by EDP today was noted to be associated with significant vaginal drainage.  Associated wet prep as well as gonorrhea/chlamydia probe are currently pending, while RPR and UA are also pending.  It is noted that the transvaginal ultrasound showed no evidence to suggest associated ovarian torsion.  SIRS criteria met via leukocytosis with presenting white blood cell count of 21,300 with neutrophilic predominance, tachycardia. Of note, in the absence of any evidence of end organ damage, which noting that the STAT LA that I have ordered is currently pending, pt's sepsis does not meet criteria to be considered severe in nature. Also, in the absence of LA level greater than or equal to 4.0, and in the absence of any associated hypotension refractory to IVF's, there are no indications for administration of a 30 mL/kg IVF bolus at this time.   EDP has ordered collection of blood culture followed by administration of doxycycline , Rocephin , as well as Flagyl .  I have ordered STAT LA, the result of which is currently pending.  EDP d/w on-call Margarete Kettle, Dr. Henry, who will formally consult and see patient in the AM. She agrees with existing antibiotic regimen of Rocephin , doxycycline , and Flagyl , and recommends holding off on IR drainage of the abscess for 2-3 days to allow time for abx to take effect.   Plan: CBC w/ diff and CMP in AM. Follow for results of blood cx's, as above. Abx: Continue Rocephin , doxycycline , IV Flagyl , as above.  Lactated Ringer 's at one 125 cc/h x 12 hours.  Stat lactic acid every 3 hours x 2 occurrences, as above.  Follow for results of wet prep, gonorrhea/chlamydia probe, RPR.  Follow-up result urinalysis.  OB/GYN to formally consult and see the patient tomorrow, as above.  As needed acetaminophen  for fever.  As needed IV Dilaudid .  As needed IV Toradol .  As needed IV  Zofran .                    #) Supraumbilical ventral hernia and umbilical ventral hernia: In the context of a history of prior ventral hernia status post mesh hernia repair in 2013 followed by recurrent incisional hernia repair in April 2021, the patient is noted on today's CT abdomen/pelvis with contrast to have moderate size supraumbilical ventral hernia as well as a large umbilical ventral hernia, containing right colon, transverse colon and numerous loops of small bowel, but in the absence of evidence of bowel obstruction or strangulation.   EDP discussed pt's ventral hernias with on-call general surgery, Dr. Rubin, who has consulted. From the perspective of the ventral hernia, he recommended conservative measures for now, PO challenge, pain control.  He conveyed that general surgery will continue to follow during this hospitalization and noted no indication for urgent/emergent surgical intervention at this time.  Plan: Clear liquid diet.  Prn IV Dilaudid .  IV fluids, as above.  Prn IV Zofran .  General surgery to continue to follow during this hospitalization, as above.  Repeat CBC in the morning.  Check INR.  Incentive spirometry ordered.  Monitor strict I's and O's and daily weights.                      #) Hypokalemia: presenting potassium level noted to be 2.9, in the setting of recent increase in GI losses in the form of 4 days of intermittent nausea/vomiting.    Plan: monitor on tele. KCl  40 meq  iv over 4 hours.  Additionally, will provide kcl 20 meq po x1 dose now.  Add-on serum mag level. CMP, mag level in the AM.  Lactated Ringer 's, as above.                     #) Essential Hypertension: documented h/o such, with outpatient antihypertensive regimen including spironolactone as well as Norvasc .  SBP's in the ED today: Low 100s to 140s mmHg. in the setting of presenting sepsis and plan for ongoing IV fluids, will hold home  spironolactone and Norvasc  for now.  Plan: Close monitoring of subsequent BP via routine VS. hold home spironolactone and Norvasc  for now, as above.  Monitor strict I's and O's and daily weights.                    #) Chronic iron  deficiency anemia: Documented history of such, a/w with baseline hgb range 8.5-11, with presenting hgb slightly higher than this range , with presenting value of 11.4, with relative elevation suspected to be on basis of hemoconcentration from clinical evidence of mild dehydration in the setting of recent GI losses in the form of 4 days of intermittent nausea/vomiting.  Per chart review, it does not appear that this is on the basis of any history of GI bleed.  And current presentation is not associate with any overt evidence of active bleed.    Plan: Repeat CBC in the morning.  Check INR.       DVT prophylaxis: SCD's   Code Status: Full code Family Communication: none Disposition Plan: Per Rounding Team Consults called: EDP d/w on-call Margarete Kettle, Dr. Henry, who will formally consult and see patient in the AM. She agrees with existing antibiotic regimen of Rocephin , doxycycline , and Flagyl , and recommends holding off on IR drainage of the abscess for 2-3 days to allow time for abx to take effect. EDP discussed pt's ventral hernias with on-call general surgery, Dr. Rubin, who has consulted. From the perspective of the ventral hernia, he recommended conservative measures for now, PO challenge, pain control.  He conveyed that general surgery will continue to follow during this hospitalization and noted no indication for urgent/emergent surgical intervention at this time. ;  Admission status: Inpatient     I SPENT GREATER THAN 75  MINUTES IN CLINICAL CARE TIME/MEDICAL DECISION-MAKING IN COMPLETING THIS ADMISSION.      Eva NOVAK Deara Bober DO [facility]  From 7PM - 7AM   11/22/2023, 8:11 PM

## 2023-11-22 NOTE — Consult Note (Addendum)
 Reason for Consult: Ventral hernia Referring Physician: Dr. Charlyn Sidles Victoria Chavez is an 43 y.o. female.  HPI: Patient is a 43 year old female well-known to me secondary to ventral hernia.  She has had a previous repair with mesh.  She has history of morbid obesity.  Recently in the last clinic visit we discussed likely bariatric surgery to help decrease the weight medialize the chance of recurrence.  Patient comes in today secondary to abdominal pain of the last 4 days.  States that she had some diarrhea.  She took some medication help with diarrhea she states that the hernias gotten larger the last 4 days the hernia areas been warm.  States that she has been having minimal p.o. intake and has not been able to tolerate much without any nausea or vomiting.  Patient with a CT scan.  Per my read there is a large hernia contents however no signs of obstruction.  She does have some stool within the colon.  I was asked see the patient's provide further recommendations.  Past Medical History:  Diagnosis Date   Abdominal hernia    Dyspnea    albuterol  inhaler prn   HTN (hypertension), benign    IDA (iron  deficiency anemia) 10/02/2023   Morbid obesity (HCC)    Sleep apnea 2007   does not use CPAP    Past Surgical History:  Procedure Laterality Date   HERNIA REPAIR  2013   with mesh   HYSTEROSCOPY WITH D & C N/A 10/08/2023   Procedure: DILATATION AND CURETTAGE /HYSTEROSCOPY;  Surgeon: Rosalva Sawyer, MD;  Location: [facility] OR;  Service: Gynecology;  Laterality: N/A;   INCISIONAL HERNIA REPAIR N/A 08/12/2019   Procedure: Recurrent incisional hernia repair;  Surgeon: Rubin Calamity, MD;  Location: [facility] OR;  Service: General;  Laterality: N/A;   LAPAROTOMY N/A 08/12/2019   Procedure: EXPLORATORY LAPAROTOMY;  Surgeon: Rubin Calamity, MD;  Location: [facility] OR;  Service: General;  Laterality: N/A;   MELINDA RESECTION N/A 10/08/2023   Procedure: MELINDA RESECTION POLYPECTOMY;  Surgeon: Rosalva Sawyer, MD;   Location: [facility] OR;  Service: Gynecology;  Laterality: N/A;   OMENTECTOMY N/A 08/12/2019   Procedure: Partial Omentectomy;  Surgeon: Rubin Calamity, MD;  Location: [facility] OR;  Service: General;  Laterality: N/A;    History reviewed. No pertinent family history.  Social History:  reports that she has quit smoking. Her smoking use included cigarettes. She has never used smokeless tobacco. She reports current alcohol use of about 1.0 - 2.0 standard drink of alcohol per week. She reports current drug use. Frequency: 14.00 times per week. Drug: Marijuana.  Allergies: No Known Allergies  Medications: I have reviewed the patient's current medications.  Results for orders placed or performed during the hospital encounter of 11/22/23 (from the past 48 hours)  CBC with Differential     Status: Abnormal   Collection Time: 11/22/23 12:59 PM  Result Value Ref Range   WBC 21.3 (H) 4.0 - 10.5 K/uL   RBC 5.14 (H) 3.87 - 5.11 MIL/uL   Hemoglobin 11.4 (L) 12.0 - 15.0 g/dL   HCT 62.4 63.9 - 53.9 %   MCV 73.0 (L) 80.0 - 100.0 fL   MCH 22.2 (L) 26.0 - 34.0 pg   MCHC 30.4 30.0 - 36.0 g/dL   RDW 80.0 (H) 88.4 - 84.4 %   Platelets 487 (H) 150 - 400 K/uL   nRBC 0.0 0.0 - 0.2 %   Neutrophils Relative % 80 %   Neutro Abs 17.1 (H) 1.7 -  7.7 K/uL   Lymphocytes Relative 8 %   Lymphs Abs 1.6 0.7 - 4.0 K/uL   Monocytes Relative 11 %   Monocytes Absolute 2.3 (H) 0.1 - 1.0 K/uL   Eosinophils Relative 0 %   Eosinophils Absolute 0.0 0.0 - 0.5 K/uL   Basophils Relative 0 %   Basophils Absolute 0.0 0.0 - 0.1 K/uL   Immature Granulocytes 1 %   Abs Immature Granulocytes 0.18 (H) 0.00 - 0.07 K/uL    Comment: Performed at [facility], [address] [place], [place] [address]  Comprehensive metabolic panel     Status: Abnormal   Collection Time: 11/22/23 12:59 PM  Result Value Ref Range   Sodium 137 135 - 145 mmol/L   Potassium 2.9 (L) 3.5 - 5.1 mmol/L   Chloride 102 98 - 111 mmol/L   CO2 23 22 - 32 mmol/L    Glucose, Bld 127 (H) 70 - 99 mg/dL    Comment: Glucose reference range applies only to samples taken after fasting for at least 8 hours.   BUN 7 6 - 20 mg/dL   Creatinine, Ser 9.06 0.44 - 1.00 mg/dL   Calcium 8.9 8.9 - 89.6 mg/dL   Total Protein 7.7 6.5 - 8.1 g/dL   Albumin 2.8 (L) 3.5 - 5.0 g/dL   AST 20 15 - 41 U/L   ALT 20 0 - 44 U/L   Alkaline Phosphatase 164 (H) 38 - 126 U/L   Total Bilirubin 0.8 0.0 - 1.2 mg/dL   GFR, Estimated >39 >39 mL/min    Comment: (NOTE) Calculated using the CKD-EPI Creatinine Equation (2021)    Anion gap 12 5 - 15    Comment: Performed at [facility], [address], [place], [place] [address]  hCG, serum, qualitative     Status: None   Collection Time: 11/22/23 12:59 PM  Result Value Ref Range   Preg, Serum NEGATIVE NEGATIVE    Comment:        THE SENSITIVITY OF THIS METHODOLOGY IS >10 mIU/mL. Performed at [facility], [address], [place], [place] [address]   Lipase, blood     Status: None   Collection Time: 11/22/23 12:59 PM  Result Value Ref Range   Lipase 17 11 - 51 U/L    Comment: Performed at [facility], [address] [place], [place] [address]    No results found.  Review of Systems  Constitutional:  Negative for chills and fever.  HENT:  Negative for ear discharge, hearing loss and sore throat.   Eyes:  Negative for discharge.  Respiratory:  Negative for cough and shortness of breath.   Cardiovascular:  Negative for chest pain and leg swelling.  Gastrointestinal:  Positive for abdominal pain, constipation and diarrhea. Negative for nausea and vomiting.  Musculoskeletal:  Negative for myalgias and neck pain.  Skin:  Negative for rash.  Allergic/Immunologic: Negative for environmental allergies.  Neurological:  Negative for dizziness and seizures.  Hematological:  Does not bruise/bleed easily.  Psychiatric/Behavioral:  Negative for suicidal ideas.   All other systems reviewed and are negative.  Blood  pressure 126/64, pulse (!) 103, temperature 99.1 F (37.3 C), temperature source Oral, resp. rate (!) 25, height 5' 7 (1.702 m), weight (!) 146.5 kg, last menstrual period 11/10/2023, SpO2 99%. Physical Exam Constitutional:      Appearance: She is well-developed.     Comments: Conversant No acute distress  HENT:     Head:  Normocephalic and atraumatic.  Eyes:     General: Lids are normal. No scleral icterus.    Pupils: Pupils are equal, round, and reactive to light.     Comments: Pupils are equal round and reactive No lid lag Moist conjunctiva  Neck:     Thyroid: No thyromegaly.     Trachea: No tracheal tenderness.     Comments: No cervical lymphadenopathy Cardiovascular:     Rate and Rhythm: Normal rate and regular rhythm.     Heart sounds: No murmur heard. Pulmonary:     Effort: Pulmonary effort is normal.     Breath sounds: Normal breath sounds. No wheezing or rales.  Abdominal:     Tenderness: There is abdominal tenderness in the periumbilical area.     Hernia: No hernia is present.      Comments: Abd pain, hernia  Musculoskeletal:     Cervical back: Normal range of motion and neck supple.  Skin:    General: Skin is warm.     Findings: No rash.     Nails: There is no clubbing.     Comments: Normal skin turgor  Neurological:     Mental Status: She is alert and oriented to person, place, and time.     Comments: Normal gait and station  Psychiatric:        Mood and Affect: Mood normal.        Thought Content: Thought content normal.        Judgment: Judgment normal.     Comments: Appropriate affect     Assessment/Plan: 43 year old female with large ventral hernia x 2. History of morbid obesity.  1.  At this time we will try p.o. trial to see if she can tolerate this.  If so would recommend discharge with pain control and she can follow-up this week so we can discuss surgery on a more urgent basis.  If patient is unable to tolerate p.o. we will plan on admission  for IV fluid hydration, pain control I still feel that outpatient treatment of the hernia would be ideal less this is not improving I discussed this plan with Dr. Charlyn   Addendum:  CT with likely TOA If admitted for tx will follow along  Lynda Leos 11/22/2023, 3:01 PM

## 2023-11-22 NOTE — ED Provider Notes (Signed)
 [place] EMERGENCY DEPARTMENT AT [facility] Provider Note   CSN: [id number] Arrival date & time: 11/22/23  1212     Patient presents with: Hernia, Constipation, and Emesis   Victoria Chavez is a 43 y.o. female.   HPI     43 year old female comes into the emergency room with chief complaint of abdominal pain.  Patient has previous history of incarcerated umbilical hernia.  She had the hernia repaired in 2021.  She states that afterwards, she had noted a plum sized hernia returned.  She subsequently has been seen by surgery, there was discussion that patient can potentially get both a weight loss surgery and hernia repair at the same time.  Patient was doing well until about 4 days ago, when the swelling got worse.  In the last 2 days, she has had severe pain along with nausea, vomiting, feeling bloated and when she has a BM, it is jellylike.  She went to urgent care and was advised to come to the ER. Patient denies any fevers, chills.  Prior to Admission medications   Medication Sig Start Date End Date Taking? Authorizing Provider  albuterol  (PROVENTIL  HFA;VENTOLIN  HFA) 108 (90 BASE) MCG/ACT inhaler Inhale 2 puffs into the lungs every 6 (six) hours as needed. For shortness of breath   Yes [provider]  amLODipine  (NORVASC ) 5 MG tablet Take 1 tablet (5 mg total) by mouth daily. 10/29/19  Yes Izell Harari, MD  cyanocobalamin  (VITAMIN B12) 1000 MCG/ML injection Inject 1 mL (1,000 mcg total) into the muscle once a week for 35 days, THEN 1 mL (1,000 mcg total) every 30 (thirty) days. 10/15/23 10/14/24 Yes Burton, Lacie K, NP  ibuprofen  (ADVIL ) 800 MG tablet Take 1 tablet (800 mg total) by mouth every 8 (eight) hours as needed for mild pain (pain score 1-3). 10/08/23  Yes Rosalva Sawyer, MD  Loperamide HCl (IMODIUM PO) Take 2 tablets by mouth as needed (for diarrhea).   Yes [provider]  spironolactone (ALDACTONE) 25 MG tablet Take 25 mg by mouth daily.  03/13/23  Yes [provider]  SYRINGE-NEEDLE, DISP, 3 ML (LUER LOCK SAFETY SYRINGES) 22G X 1-1/2 3 ML MISC 1 Syringe by Does not apply route once a week for 35 days, THEN 1 Syringe every 30 (thirty) days. Used to administer Cyanocobalamin  (Vitamin B12) injection. 10/15/23 10/14/24  Burton, Lacie K, NP    Allergies: Patient has no known allergies.    Review of Systems  All other systems reviewed and are negative.   Updated Vital Signs BP 117/71   Pulse (!) 103   Temp 99 F (37.2 C) (Oral)   Resp (!) 24   Ht 5' 7 (1.702 m)   Wt (!) 146.5 kg   LMP 11/10/2023 (Exact Date)   SpO2 94%   BMI 50.59 kg/m   Physical Exam Vitals and nursing note reviewed.  Constitutional:      General: She is in acute distress.     Appearance: She is well-developed.  HENT:     Head: Atraumatic.  Cardiovascular:     Rate and Rhythm: Normal rate.  Pulmonary:     Effort: Pulmonary effort is normal.  Abdominal:     Palpations: Abdomen is soft.     Hernia: A hernia is present.     Comments: Large ventral hernia noted, with some warmth to touch, tender to palpation  Musculoskeletal:     Cervical back: Normal range of motion and neck supple.  Skin:  General: Skin is warm and dry.  Neurological:     Mental Status: She is alert and oriented to person, place, and time.     (all labs ordered are listed, but only abnormal results are displayed) Labs Reviewed  WET PREP, GENITAL - Abnormal; Notable for the following components:      Result Value   Clue Cells Wet Prep HPF POC PRESENT (*)    WBC, Wet Prep HPF POC >=10 (*)    All other components within normal limits  CBC WITH DIFFERENTIAL/PLATELET - Abnormal; Notable for the following components:   WBC 21.3 (*)    RBC 5.14 (*)    Hemoglobin 11.4 (*)    MCV 73.0 (*)    MCH 22.2 (*)    RDW 19.9 (*)    Platelets 487 (*)    Neutro Abs 17.1 (*)    Monocytes Absolute 2.3 (*)    Abs Immature Granulocytes 0.18 (*)    All other components  within normal limits  COMPREHENSIVE METABOLIC PANEL WITH GFR - Abnormal; Notable for the following components:   Potassium 2.9 (*)    Glucose, Bld 127 (*)    Albumin 2.8 (*)    Alkaline Phosphatase 164 (*)    All other components within normal limits  CULTURE, BLOOD (SINGLE)  HCG, SERUM, QUALITATIVE  LIPASE, BLOOD  RPR  URINALYSIS, ROUTINE W REFLEX MICROSCOPIC  CBC WITH DIFFERENTIAL/PLATELET  COMPREHENSIVE METABOLIC PANEL WITH GFR  MAGNESIUM  MAGNESIUM  I-STAT CG4 LACTIC ACID, ED  GC/CHLAMYDIA PROBE AMP ([place]) NOT AT [facility]    EKG: EKG Interpretation Date/Time:  Saturday November 22 2023 12:59:10 EDT Ventricular Rate:  105 PR Interval:  149 QRS Duration:  94 QT Interval:  321 QTC Calculation: 425 R Axis:   7  Text Interpretation: Sinus tachycardia Sinus pause Left ventricular hypertrophy Borderline T abnormalities, inferior leads Nonspecific ST and T wave abnormality Confirmed by Charlyn Sora [phone]) on 11/22/2023 2:35:10 PM  Radiology: US  PELVIC COMPLETE W TRANSVAGINAL AND TORSION R/O Result Date: 11/22/2023 CLINICAL DATA:  Pain EXAM: TRANSABDOMINAL AND TRANSVAGINAL ULTRASOUND OF PELVIS TECHNIQUE: Both transabdominal and transvaginal ultrasound examinations of the pelvis were performed. Transabdominal technique was performed for global imaging of the pelvis including uterus, ovaries, adnexal regions, and pelvic cul-de-sac. It was necessary to proceed with endovaginal exam following the transabdominal exam to visualize the ovaries. COMPARISON:  CT abdomen and pelvis 11/22/2023. FINDINGS: Uterus Measurements: 7.3 x 2.7 x 3.5 cm = volume: 35 mL. Limited evaluation. No fibroids or other mass visualized. Endometrium Thickness: 9.5 mm.  No focal abnormality visualized. Right ovary Not visualized. Left ovary Measurements: 4.4 x 3.9 x 2.4 cm = volume: 21 mL. Complex low-attenuation mass containing septations and internal debris seen in the left adnexa/left paraovarian region measuring  7.9 x 6.9 x 6.3 cm. Vascular flow is identified in the left ovary. Other findings No abnormal free fluid. IMPRESSION: 1. Complex low-attenuation mass containing septations and internal debris seen in the left adnexa/left paraovarian region measuring 7.9 x 6.9 x 6.3 cm. Findings are indeterminate and may represent tubo-ovarian abscess, although endometrioma and complex or hemorrhagic cysts are also in the differential. 2. Right ovary not visualized. Electronically Signed   By: Greig Pique M.D.   On: 11/22/2023 17:43   CT ABDOMEN PELVIS W CONTRAST Result Date: 11/22/2023 CLINICAL DATA:  Bowel obstruction suspected Hernia suspected, abdominal wall. No bowel movement in 4 days. Vomiting. EXAM: CT ABDOMEN AND PELVIS WITH CONTRAST TECHNIQUE: Multidetector CT imaging of the abdomen  and pelvis was performed using the standard protocol following bolus administration of intravenous contrast. RADIATION DOSE REDUCTION: This exam was performed according to the departmental dose-optimization program which includes automated exposure control, adjustment of the mA and/or kV according to patient size and/or use of iterative reconstruction technique. CONTRAST:  OMNIPAQUE  IOHEXOL  350 MG/ML SOLN COMPARISON:  08/12/2023 FINDINGS: Lower chest: No acute findings Hepatobiliary: Multiple gallstones within the gallbladder. No CT evidence of acute cholecystitis. No focal hepatic abnormality. Pancreas: No focal abnormality or ductal dilatation. Spleen: No focal abnormality.  Normal size. Adrenals/Urinary Tract: Adrenal glands normal. 10 mm left lower pole nonobstructing renal stone. No ureteral stones or hydronephrosis. Urinary bladder decompressed, grossly unremarkable. Stomach/Bowel: Left colonic diverticulosis. No active diverticulitis. Stomach and small bowel decompressed. Supra umbilical ventral hernia present containing transverse colon. There is a large umbilical hernia which contains transverse colon and right colon as well  as numerous small bowel loops. No evidence of bowel obstruction. Vascular/Lymphatic: No aneurysm. Shotty retroperitoneal lymph nodes have increased since prior study, with short axis diameter measuring up to 9 mm. These are likely reactive. Reproductive: Large complex fluid collection noted in the pelvis which appears to be posterior to the uterus in the cul-de-sac. This entire complex area measures 13 x 8 cm. This is concerning for abscess. Neither ovary definitively visualized. Other: No free fluid or free air. Musculoskeletal: No acute bony abnormality. IMPRESSION: Large complex fluid collection in the cul-de-sac of the pelvis measuring 13 x 8 cm, new since prior study. This is concerning for tubo-ovarian abscess. Moderate-sized supraumbilical ventral hernia and large umbilical ventral hernia. Hernias contain right colon, transverse colon and numerous small bowel loops. No evidence of bowel obstruction. Cholelithiasis. Shotty retroperitoneal adenopathy, likely reactive. Electronically Signed   By: Franky Crease M.D.   On: 11/22/2023 15:16     Hernia reduction  Date/Time: 11/22/2023 2:45 PM  Performed by: Charlyn Sora, MD Authorized by: Charlyn Sora, MD  Consent: Verbal consent obtained Risks and benefits: risks, benefits and alternatives were discussed Consent given by: patient Patient understanding: patient states understanding of the procedure being performed Imaging studies: imaging studies available Patient identity confirmed: arm band Time out: Immediately prior to procedure a time out was called to verify the correct patient, procedure, equipment, support staff and site/side marked as required. Preparation: Patient was prepped and draped in the usual sterile fashion. Local anesthesia used: no  Anesthesia: Local anesthesia used: no  Sedation: Patient sedated: no  Patient tolerance: patient tolerated the procedure well with no immediate complications Comments: Attempted to  reduce the large hernia, without success      Medications Ordered in the ED  cefTRIAXone  (ROCEPHIN ) 2 g in sodium chloride  0.9 % 100 mL IVPB (0 g Intravenous Stopped 11/22/23 1645)  HYDROmorphone  (DILAUDID ) injection 0.5 mg (has no administration in time range)  acetaminophen  (TYLENOL ) tablet 650 mg (has no administration in time range)    Or  acetaminophen  (TYLENOL ) suppository 650 mg (has no administration in time range)  melatonin tablet 3 mg (has no administration in time range)  ondansetron  (ZOFRAN ) injection 4 mg (has no administration in time range)  lactated ringers  infusion (has no administration in time range)  naloxone  (NARCAN ) injection 0.4 mg (has no administration in time range)  doxycycline  (VIBRAMYCIN ) 100 mg in sodium chloride  0.9 % 250 mL IVPB (has no administration in time range)  metroNIDAZOLE  (FLAGYL ) IVPB 500 mg (has no administration in time range)  morphine  (PF) 4 MG/ML injection 4 mg (4 mg Intravenous Given  11/22/23 1315)  iohexol  (OMNIPAQUE ) 350 MG/ML injection 100 mL (100 mLs Intravenous Contrast Given 11/22/23 1409)  metroNIDAZOLE  (FLAGYL ) IVPB 500 mg (0 mg Intravenous Stopped 11/22/23 1729)  doxycycline  (VIBRAMYCIN ) 100 mg in sodium chloride  0.9 % 250 mL IVPB (0 mg Intravenous Stopped 11/22/23 1810)  HYDROmorphone  (DILAUDID ) injection 1 mg (1 mg Intravenous Given 11/22/23 1753)  sodium chloride  0.9 % bolus 1,000 mL (1,000 mLs Intravenous New Bag/Given 11/22/23 2025)    Clinical Course as of 11/22/23 2042  Sat Nov 22, 2023  1754 Pt reassessed - still having abdominal pain.  Will give dilaudid .  Left page with [facility] provider now, who is in a procedure, regarding possible TOA.  Pt completing antibiotics and in agreement with admission.  Per general surgeon, hernia could be repaired outpatient if pt tolerating PO. [MT]  1928 Dr Henry [facility] coverage agrees with medical admission on antibiotics; they will consult tomorrow; if pt failing to improve after 2-3 days  of antibiotics, could consider IR drainage of fluid collection [MT]    Clinical Course User Index [MT] Trifan, Donnice PARAS, MD                                 Medical Decision Making Amount and/or Complexity of Data Reviewed Labs: ordered. Radiology: ordered.  Risk Prescription drug management. Decision regarding hospitalization.   This patient presents to the ED with chief complaint(s) of abdominal pain with pertinent past medical history of previous history of hernia repair, and worsening hernia more recently.The complaint involves an extensive differential diagnosis and also carries with it a high risk of complications and morbidity.    The differential diagnosis includes : Small bowel obstruction due to incarcerated hernia, hernia related pain, bowel ischemia  The initial plan is to get basic labs and CT abdomen and pelvis.  I was unable to reduce the hernia.  Given that the hernia was small just until 4 days ago and became large, she is at high risk of incarceration versus someone who always has a large hernia.  Her white count is elevated.  I have consulted general surgery and spoken with Dr. Rubin.  I informed him that I am concerned patient likely has incarcerated hernia, CT is pending.  Dr. Rubin to come and see the patient.   Additional history obtained: Records reviewed urgent care visit note, CT scan from April when patient had seen general surgery, outpatient surgery notes  Independent labs interpretation:  The following labs were independently interpreted: Elevated white count  Independent visualization and interpretation of imaging: - I independently visualized the following imaging with scope of interpretation limited to determining acute life threatening conditions related to emergency care: CT, which revealed large hernia, no clear SBO. Per Radiologist - there is fluid that is concerning for TOA.  Pt indicates that she had sex for the first time in months on  her b'day and has vaginal discharge, but not bleeding or pain. Pelvic exam is revealing pus, she has adnexal tenderness.  US  ordered to eval for TOA. Care signed out to incoming team.  Clinically not septic. She is a difficult stick, I have asked to get 1 culture only.    Final diagnoses:  Hypokalemia  PID (acute pelvic inflammatory disease)  Abscess  Ventral hernia without obstruction or gangrene  TOA (tubo-ovarian abscess)    ED Discharge Orders     None  Charlyn Sora, MD 11/22/23 2042

## 2023-11-23 ENCOUNTER — Inpatient Hospital Stay (HOSPITAL_COMMUNITY)

## 2023-11-23 DIAGNOSIS — N7093 Salpingitis and oophoritis, unspecified: Secondary | ICD-10-CM | POA: Diagnosis not present

## 2023-11-23 LAB — CBC WITH DIFFERENTIAL/PLATELET
Abs Immature Granulocytes: 0.12 K/uL — ABNORMAL HIGH (ref 0.00–0.07)
Basophils Absolute: 0 K/uL (ref 0.0–0.1)
Basophils Relative: 0 %
Eosinophils Absolute: 0 K/uL (ref 0.0–0.5)
Eosinophils Relative: 0 %
HCT: 36.6 % (ref 36.0–46.0)
Hemoglobin: 10.8 g/dL — ABNORMAL LOW (ref 12.0–15.0)
Immature Granulocytes: 1 %
Lymphocytes Relative: 8 %
Lymphs Abs: 1.3 K/uL (ref 0.7–4.0)
MCH: 22.2 pg — ABNORMAL LOW (ref 26.0–34.0)
MCHC: 29.5 g/dL — ABNORMAL LOW (ref 30.0–36.0)
MCV: 75.2 fL — ABNORMAL LOW (ref 80.0–100.0)
Monocytes Absolute: 1.9 K/uL — ABNORMAL HIGH (ref 0.1–1.0)
Monocytes Relative: 13 %
Neutro Abs: 12.2 K/uL — ABNORMAL HIGH (ref 1.7–7.7)
Neutrophils Relative %: 78 %
Platelets: 246 K/uL (ref 150–400)
RBC: 4.87 MIL/uL (ref 3.87–5.11)
RDW: 20.5 % — ABNORMAL HIGH (ref 11.5–15.5)
WBC: 15.5 K/uL — ABNORMAL HIGH (ref 4.0–10.5)
nRBC: 0 % (ref 0.0–0.2)

## 2023-11-23 LAB — COMPREHENSIVE METABOLIC PANEL WITH GFR
ALT: 23 U/L (ref 0–44)
AST: 34 U/L (ref 15–41)
Albumin: 2.4 g/dL — ABNORMAL LOW (ref 3.5–5.0)
Alkaline Phosphatase: 164 U/L — ABNORMAL HIGH (ref 38–126)
Anion gap: 13 (ref 5–15)
BUN: 8 mg/dL (ref 6–20)
CO2: 18 mmol/L — ABNORMAL LOW (ref 22–32)
Calcium: 8.4 mg/dL — ABNORMAL LOW (ref 8.9–10.3)
Chloride: 106 mmol/L (ref 98–111)
Creatinine, Ser: 0.73 mg/dL (ref 0.44–1.00)
GFR, Estimated: >60 mL/min (ref >60)
Glucose, Bld: 122 mg/dL — ABNORMAL HIGH (ref 70–99)
Potassium: 4.3 mmol/L (ref 3.5–5.1)
Sodium: 137 mmol/L (ref 135–145)
Total Bilirubin: 1 mg/dL (ref 0.0–1.2)
Total Protein: 6.6 g/dL (ref 6.5–8.1)

## 2023-11-23 LAB — URINALYSIS, ROUTINE W REFLEX MICROSCOPIC
Bacteria, UA: NONE SEEN
Bilirubin Urine: NEGATIVE
Glucose, UA: NEGATIVE mg/dL
Hgb urine dipstick: NEGATIVE
Ketones, ur: 5 mg/dL — AB
Leukocytes,Ua: NEGATIVE
Nitrite: NEGATIVE
Protein, ur: 30 mg/dL — AB
Specific Gravity, Urine: >1.046 — ABNORMAL HIGH (ref 1.005–1.030)
pH: 5 (ref 5.0–8.0)

## 2023-11-23 LAB — RPR: RPR Ser Ql: NONREACTIVE

## 2023-11-23 LAB — MAGNESIUM
Magnesium: 2.2 mg/dL (ref 1.7–2.4)
Magnesium: 2.2 mg/dL (ref 1.7–2.4)

## 2023-11-23 LAB — PROTIME-INR
INR: 1.2 (ref 0.8–1.2)
Prothrombin Time: 15.8 s — ABNORMAL HIGH (ref 11.4–15.2)

## 2023-11-23 NOTE — Plan of Care (Signed)

## 2023-11-23 NOTE — Progress Notes (Signed)
 Progress Note    Victoria Chavez  FMW:[id number] DOB: 1981-03-01  DOA: 11/22/2023 PCP: Ilah Crigler, MD      Brief Narrative:    Medical records reviewed and are as summarized below:  Victoria Chavez is a 43 y.o. female  with medical history significant for ventral hernia status post mesh hernia repair in 2013 with recurrent incisional hernia repair in April 2021, essential hypertension, chronic iron  deficiency anemia with baseline hemoglobin 8.5-11, who presented to the hospital with abdominal pain, nausea and vomiting.  She was found to have sepsis secondary to left-sided tubo-ovarian abscess.       Assessment/Plan:   Principal Problem:   Tubal ovarian abscess Active Problems:   Ventral hernia   Chronic iron  deficiency anemia   Sepsis (HCC)   PID (acute pelvic inflammatory disease)   Hypokalemia   History of essential hypertension    Body mass index is 50.59 kg/m.  (Morbid obesity)   Sepsis secondary to PID/left tubo-ovarian abscess:Leukocytosis improving (down from 21.3-15.5).  Continue empiric IV antibiotics (ceftriaxone , Flagyl  and doxycycline ).  Analgesics as needed for pain.  Follow-up with gynecologist for further recommendations.   Large ventral/umbilical hernia, constipation: Normal x-rays pending.  Follow-up with general surgeon for further recommendations.   Hypokalemia: Improved.   Comorbidities include hypertension, chronic iron  deficiency anemia, morbid obesity.    Diet Order             Diet clear liquid Room service appropriate? Yes; Fluid consistency: Thin  Diet effective now                            Consultants: General Surgeon Gynecologist  Procedures: None    Medications:    Continuous Infusions:  cefTRIAXone  (ROCEPHIN )  IV Stopped (11/23/23 0940)   doxycycline  (VIBRAMYCIN ) IV Stopped (11/23/23 0557)   metronidazole  Stopped (11/23/23 0417)     Anti-infectives (From admission, onward)     Start     Dose/Rate Route Frequency Ordered Stop   11/23/23 0300  doxycycline  (VIBRAMYCIN ) 100 mg in sodium chloride  0.9 % 250 mL IVPB        100 mg 125 mL/hr over 120 Minutes Intravenous Every 12 hours 11/22/23 2010     11/23/23 0300  metroNIDAZOLE  (FLAGYL ) IVPB 500 mg        500 mg 100 mL/hr over 60 Minutes Intravenous Every 12 hours 11/22/23 2010     11/22/23 1530  cefTRIAXone  (ROCEPHIN ) 2 g in sodium chloride  0.9 % 100 mL IVPB        2 g 200 mL/hr over 30 Minutes Intravenous Every 24 hours 11/22/23 1522     11/22/23 1530  metroNIDAZOLE  (FLAGYL ) IVPB 500 mg        500 mg 100 mL/hr over 60 Minutes Intravenous  Once 11/22/23 1522 11/22/23 1729   11/22/23 1530  doxycycline  (VIBRAMYCIN ) 100 mg in sodium chloride  0.9 % 250 mL IVPB        100 mg 125 mL/hr over 120 Minutes Intravenous  Once 11/22/23 1522 11/22/23 1810              Family Communication/Anticipated D/C date and plan/Code Status   DVT prophylaxis: SCDs Start: 11/22/23 2005     Code Status: Full Code  Family Communication: None Disposition Plan: Plan to discharge home   Status is: Inpatient Remains inpatient appropriate because: Tubo-ovarian abscess       Subjective:   Interval events noted.  Abdominal pain  is better because she just got some pain medicine.  She complains of constipation.  Objective:    Vitals:   11/22/23 2231 11/22/23 2303 11/22/23 2305 11/23/23 0814  BP: 121/69  126/60 117/74  Pulse: (!) 102 99 99 90  Resp: 18 19  20   Temp: 98.7 F (37.1 C) 98.8 F (37.1 C)  99.1 F (37.3 C)  TempSrc: Oral Oral  Oral  SpO2: 97% 97%    Weight:      Height:  5' 7 (1.702 m)     No data found.   Intake/Output Summary (Last 24 hours) at 11/23/2023 1147 Last data filed at 11/23/2023 1100 Gross per 24 hour  Intake 1642.74 ml  Output 500 ml  Net 1142.74 ml   Filed Weights   11/22/23 1218  Weight: (!) 146.5 kg    Exam:  GEN: NAD SKIN: Warm and dry EYES: No pallor or icterus ENT:  MMM CV: RRR PULM: CTA B ABD: soft, obese, large umbilical hernia with some tenderness, no rebound tenderness or guarding, +BS CNS: AAO x 3, non focal EXT: No edema or tenderness        Data Reviewed:   I have personally reviewed following labs and imaging studies:  Labs: Labs show the following:   Basic Metabolic Panel: Recent Labs  Lab 11/22/23 1259 11/22/23 2339 11/23/23 0405  NA 137  --  137  K 2.9*  --  4.3  CL 102  --  106  CO2 23  --  18*  GLUCOSE 127*  --  122*  BUN 7  --  8  CREATININE 0.93  --  0.73  CALCIUM 8.9  --  8.4*  MG  --  2.2 2.2   GFR Estimated Creatinine Clearance: 136.8 mL/min (by C-G formula based on SCr of 0.73 mg/dL). Liver Function Tests: Recent Labs  Lab 11/22/23 1259 11/23/23 0405  AST 20 34  ALT 20 23  ALKPHOS 164* 164*  BILITOT 0.8 1.0  PROT 7.7 6.6  ALBUMIN 2.8* 2.4*   Recent Labs  Lab 11/22/23 1259  LIPASE 17   No results for input(s): AMMONIA in the last 168 hours. Coagulation profile Recent Labs  Lab 11/23/23 0405  INR 1.2    CBC: Recent Labs  Lab 11/22/23 1259 11/23/23 0405  WBC 21.3* 15.5*  NEUTROABS 17.1* 12.2*  HGB 11.4* 10.8*  HCT 37.5 36.6  MCV 73.0* 75.2*  PLT 487* 246   Cardiac Enzymes: No results for input(s): CKTOTAL, CKMB, CKMBINDEX, TROPONINI in the last 168 hours. BNP (last 3 results) No results for input(s): PROBNP in the last 8760 hours. CBG: No results for input(s): GLUCAP in the last 168 hours. D-Dimer: No results for input(s): DDIMER in the last 72 hours. Hgb A1c: No results for input(s): HGBA1C in the last 72 hours. Lipid Profile: No results for input(s): CHOL, HDL, LDLCALC, TRIG, CHOLHDL, LDLDIRECT in the last 72 hours. Thyroid function studies: No results for input(s): TSH, T4TOTAL, T3FREE, THYROIDAB in the last 72 hours.  Invalid input(s): FREET3 Anemia work up: No results for input(s): VITAMINB12, FOLATE, FERRITIN, TIBC,  IRON , RETICCTPCT in the last 72 hours. Sepsis Labs: Recent Labs  Lab 11/22/23 1259 11/23/23 0405  WBC 21.3* 15.5*    Microbiology Recent Results (from the past 240 hours)  Wet prep, genital     Status: Abnormal   Collection Time: 11/22/23  3:57 PM   Specimen: PATH Cytology Ancillary Only  Result Value Ref Range Status   Yeast Wet Prep HPF  POC NONE SEEN NONE SEEN Final   Trich, Wet Prep NONE SEEN NONE SEEN Final   Clue Cells Wet Prep HPF POC PRESENT (A) NONE SEEN Final   WBC, Wet Prep HPF POC >=10 (A) <10 Final   Sperm NONE SEEN  Final    Comment: Performed at [facility], [address] [place], [place] [address]  Culture, blood (single)     Status: None (Preliminary result)   Collection Time: 11/22/23  4:00 PM   Specimen: BLOOD  Result Value Ref Range Status   Specimen Description BLOOD SITE NOT SPECIFIED  Final   Special Requests   Final    BOTTLES DRAWN AEROBIC AND ANAEROBIC Blood Culture adequate volume   Culture   Final    NO GROWTH < 24 HOURS Performed at [facility], [address] [place], [place] [address]    Report Status PENDING  Incomplete    Procedures and diagnostic studies:  US  PELVIC COMPLETE W TRANSVAGINAL AND TORSION R/O Result Date: 11/22/2023 CLINICAL DATA:  Pain EXAM: TRANSABDOMINAL AND TRANSVAGINAL ULTRASOUND OF PELVIS TECHNIQUE: Both transabdominal and transvaginal ultrasound examinations of the pelvis were performed. Transabdominal technique was performed for global imaging of the pelvis including uterus, ovaries, adnexal regions, and pelvic cul-de-sac. It was necessary to proceed with endovaginal exam following the transabdominal exam to visualize the ovaries. COMPARISON:  CT abdomen and pelvis 11/22/2023. FINDINGS: Uterus Measurements: 7.3 x 2.7 x 3.5 cm = volume: 35 mL. Limited evaluation. No fibroids or other mass visualized. Endometrium Thickness: 9.5 mm.  No focal abnormality visualized. Right ovary Not visualized. Left ovary  Measurements: 4.4 x 3.9 x 2.4 cm = volume: 21 mL. Complex low-attenuation mass containing septations and internal debris seen in the left adnexa/left paraovarian region measuring 7.9 x 6.9 x 6.3 cm. Vascular flow is identified in the left ovary. Other findings No abnormal free fluid. IMPRESSION: 1. Complex low-attenuation mass containing septations and internal debris seen in the left adnexa/left paraovarian region measuring 7.9 x 6.9 x 6.3 cm. Findings are indeterminate and may represent tubo-ovarian abscess, although endometrioma and complex or hemorrhagic cysts are also in the differential. 2. Right ovary not visualized. Electronically Signed   By: Greig Pique M.D.   On: 11/22/2023 17:43   CT ABDOMEN PELVIS W CONTRAST Result Date: 11/22/2023 CLINICAL DATA:  Bowel obstruction suspected Hernia suspected, abdominal wall. No bowel movement in 4 days. Vomiting. EXAM: CT ABDOMEN AND PELVIS WITH CONTRAST TECHNIQUE: Multidetector CT imaging of the abdomen and pelvis was performed using the standard protocol following bolus administration of intravenous contrast. RADIATION DOSE REDUCTION: This exam was performed according to the departmental dose-optimization program which includes automated exposure control, adjustment of the mA and/or kV according to patient size and/or use of iterative reconstruction technique. CONTRAST:  OMNIPAQUE  IOHEXOL  350 MG/ML SOLN COMPARISON:  08/12/2023 FINDINGS: Lower chest: No acute findings Hepatobiliary: Multiple gallstones within the gallbladder. No CT evidence of acute cholecystitis. No focal hepatic abnormality. Pancreas: No focal abnormality or ductal dilatation. Spleen: No focal abnormality.  Normal size. Adrenals/Urinary Tract: Adrenal glands normal. 10 mm left lower pole nonobstructing renal stone. No ureteral stones or hydronephrosis. Urinary bladder decompressed, grossly unremarkable. Stomach/Bowel: Left colonic diverticulosis. No active diverticulitis. Stomach and small  bowel decompressed. Supra umbilical ventral hernia present containing transverse colon. There is a large umbilical hernia which contains transverse colon and right colon as well as numerous small bowel loops. No evidence of bowel obstruction. Vascular/Lymphatic: No aneurysm. Shotty retroperitoneal lymph nodes  have increased since prior study, with short axis diameter measuring up to 9 mm. These are likely reactive. Reproductive: Large complex fluid collection noted in the pelvis which appears to be posterior to the uterus in the cul-de-sac. This entire complex area measures 13 x 8 cm. This is concerning for abscess. Neither ovary definitively visualized. Other: No free fluid or free air. Musculoskeletal: No acute bony abnormality. IMPRESSION: Large complex fluid collection in the cul-de-sac of the pelvis measuring 13 x 8 cm, new since prior study. This is concerning for tubo-ovarian abscess. Moderate-sized supraumbilical ventral hernia and large umbilical ventral hernia. Hernias contain right colon, transverse colon and numerous small bowel loops. No evidence of bowel obstruction. Cholelithiasis. Shotty retroperitoneal adenopathy, likely reactive. Electronically Signed   By: Franky Crease M.D.   On: 11/22/2023 15:16               LOS: 1 day   Jaymari Cromie  [facility]   Pager on [email or web address]. If 7PM-7AM, please contact night-coverage at [email or web address]     11/23/2023, 11:47 AM

## 2023-11-23 NOTE — Progress Notes (Addendum)
 Subjective: Patient reports tolerating PO, + flatus, and no problems voiding.   S/p hysteroscopy D&C Polypectomy 10/08/23 with benign path revealing endometrial polyp LMP 11/10/23 normal flow and only 5 days compared to her usual 7 days.  She had prolonged and heavy bleeding prior to procedure on 10/08/23. Denies F/C/N/V/D. Abdominal pain started 4 days ago and worsened with abdominal hernia getting increasingly bigger until about 2 days ago when the pain was excruciating and brought her to the hospital.  Pt passed flatus this morning with a little yellowish stuff that came out.  No BM since 4 days ago.  Size of hernia went from about a golf to tangerine size to greater than what looks like at least 10cm (maybe about 15 cms) on exam.  Pt seen at the request of Dr. Donnice Hughes.  Objective: I have reviewed patient's vital signs.  General: alert, cooperative, and no distress Resp: unlabored breathing Cardio: regular rate and rhythm GI: large right sided hernia about 15cm with bowel and very warm and mod tender Extremities: no calf tenderness Vaginal Bleeding: none   Assessment/Plan: HD #2 patient of Dr. Rexene Hoit at [facility] admitted to medicine for sepsis protocol with worsening abdominal pain likely d/t large right sided non-obstructive hernia followed by general surgery.  Pt states surgery is being planned and per her report they want to coordinate gastric sleeve with hernia repair. BMI 50.59  TOA vs Endometrioma vs Hemorrhagic cyst on ultrasound.  CT favored TOA.  Currently on rocephin , doxy and flagy with decreased WBC from 21.3 to 15.5 today.  Pt has been afebrile and Tmax this admission 99.1.  Recommend follow WBC and cont abx.  Will need a repeat ultrasound in 6-8wks but more pressing is management of pt's hernia.  Blood and urine cultures pending.  Will sign out to [facility] in the morning   LOS: 1 day    Jon CINDERELLA Rummer, MD 11/23/2023, 4:47 PM

## 2023-11-23 NOTE — Progress Notes (Signed)
 Assessment & Plan: HD#58 - 43 year old female with large ventral hernia x 2 - tolerating clear liquid diet, passing flatus, no BM - increased pain at hernia - AXR 2-view this AM pending TOA - abx's per medical service - WBC 15K, improved - GYN to see History of morbid obesity  Will discuss with Dr. Rubin today.  VH more tender, not reducible.  Check AXR this AM.         Victoria Spinner, MD [facility] practice Office: [phone]        Chief Complaint: Abd pain, VH, TOA  Subjective: Patient in bed, complains of abd pain.  Tolerating clear liquids.  Passing flatus, no BM.  Objective: Vital signs in last 24 hours: Temp:  [98.6 F (37 C)-99.1 F (37.3 C)] 99.1 F (37.3 C) (07/27 0814) Pulse Rate:  [90-116] 90 (07/27 0814) Resp:  [12-25] 20 (07/27 0814) BP: (97-141)/(60-93) 117/74 (07/27 0814) SpO2:  [94 %-100 %] 97 % (07/26 2303) Weight:  [146.5 kg] 146.5 kg (07/26 1218) Last BM Date : 11/19/23  Intake/Output from previous day: 07/26 0701 - 07/27 0700 In: -  Out: 500 [Urine:500] Intake/Output this shift: No intake/output data recorded.  Physical Exam: HEENT - sclerae clear, mucous membranes moist Abdomen - obese; obvious VH mid abdomen, tender to palpation, not reducible  Lab Results:  Recent Labs    11/22/23 1259 11/23/23 0405  WBC 21.3* 15.5*  HGB 11.4* 10.8*  HCT 37.5 36.6  PLT 487* 246   BMET Recent Labs    11/22/23 1259 11/23/23 0405  NA 137 137  K 2.9* 4.3  CL 102 106  CO2 23 18*  GLUCOSE 127* 122*  BUN 7 8  CREATININE 0.93 0.73  CALCIUM 8.9 8.4*   PT/INR Recent Labs    11/23/23 0405  LABPROT 15.8*  INR 1.2   Comprehensive Metabolic Panel:    Component Value Date/Time   NA 137 11/23/2023 0405   NA 137 11/22/2023 1259   K 4.3 11/23/2023 0405   K 2.9 (L) 11/22/2023 1259   CL 106 11/23/2023 0405   CL 102 11/22/2023 1259   CO2 18 (L) 11/23/2023 0405   CO2 23 11/22/2023 1259   BUN 8 11/23/2023 0405    BUN 7 11/22/2023 1259   CREATININE 0.73 11/23/2023 0405   CREATININE 0.93 11/22/2023 1259   GLUCOSE 122 (H) 11/23/2023 0405   GLUCOSE 127 (H) 11/22/2023 1259   CALCIUM 8.4 (L) 11/23/2023 0405   CALCIUM 8.9 11/22/2023 1259   AST 34 11/23/2023 0405   AST 20 11/22/2023 1259   ALT 23 11/23/2023 0405   ALT 20 11/22/2023 1259   ALKPHOS 164 (H) 11/23/2023 0405   ALKPHOS 164 (H) 11/22/2023 1259   BILITOT 1.0 11/23/2023 0405   BILITOT 0.8 11/22/2023 1259   PROT 6.6 11/23/2023 0405   PROT 7.7 11/22/2023 1259   ALBUMIN 2.4 (L) 11/23/2023 0405   ALBUMIN 2.8 (L) 11/22/2023 1259    Studies/Results: US  PELVIC COMPLETE W TRANSVAGINAL AND TORSION R/O Result Date: 11/22/2023 CLINICAL DATA:  Pain EXAM: TRANSABDOMINAL AND TRANSVAGINAL ULTRASOUND OF PELVIS TECHNIQUE: Both transabdominal and transvaginal ultrasound examinations of the pelvis were performed. Transabdominal technique was performed for global imaging of the pelvis including uterus, ovaries, adnexal regions, and pelvic cul-de-sac. It was necessary to proceed with endovaginal exam following the transabdominal exam to visualize the ovaries. COMPARISON:  CT abdomen and pelvis 11/22/2023. FINDINGS: Uterus Measurements: 7.3 x 2.7 x 3.5 cm = volume: 35 mL. Limited  evaluation. No fibroids or other mass visualized. Endometrium Thickness: 9.5 mm.  No focal abnormality visualized. Right ovary Not visualized. Left ovary Measurements: 4.4 x 3.9 x 2.4 cm = volume: 21 mL. Complex low-attenuation mass containing septations and internal debris seen in the left adnexa/left paraovarian region measuring 7.9 x 6.9 x 6.3 cm. Vascular flow is identified in the left ovary. Other findings No abnormal free fluid. IMPRESSION: 1. Complex low-attenuation mass containing septations and internal debris seen in the left adnexa/left paraovarian region measuring 7.9 x 6.9 x 6.3 cm. Findings are indeterminate and may represent tubo-ovarian abscess, although endometrioma and complex  or hemorrhagic cysts are also in the differential. 2. Right ovary not visualized. Electronically Signed   By: Greig Pique M.D.   On: 11/22/2023 17:43   CT ABDOMEN PELVIS W CONTRAST Result Date: 11/22/2023 CLINICAL DATA:  Bowel obstruction suspected Hernia suspected, abdominal wall. No bowel movement in 4 days. Vomiting. EXAM: CT ABDOMEN AND PELVIS WITH CONTRAST TECHNIQUE: Multidetector CT imaging of the abdomen and pelvis was performed using the standard protocol following bolus administration of intravenous contrast. RADIATION DOSE REDUCTION: This exam was performed according to the departmental dose-optimization program which includes automated exposure control, adjustment of the mA and/or kV according to patient size and/or use of iterative reconstruction technique. CONTRAST:  OMNIPAQUE  IOHEXOL  350 MG/ML SOLN COMPARISON:  08/12/2023 FINDINGS: Lower chest: No acute findings Hepatobiliary: Multiple gallstones within the gallbladder. No CT evidence of acute cholecystitis. No focal hepatic abnormality. Pancreas: No focal abnormality or ductal dilatation. Spleen: No focal abnormality.  Normal size. Adrenals/Urinary Tract: Adrenal glands normal. 10 mm left lower pole nonobstructing renal stone. No ureteral stones or hydronephrosis. Urinary bladder decompressed, grossly unremarkable. Stomach/Bowel: Left colonic diverticulosis. No active diverticulitis. Stomach and small bowel decompressed. Supra umbilical ventral hernia present containing transverse colon. There is a large umbilical hernia which contains transverse colon and right colon as well as numerous small bowel loops. No evidence of bowel obstruction. Vascular/Lymphatic: No aneurysm. Shotty retroperitoneal lymph nodes have increased since prior study, with short axis diameter measuring up to 9 mm. These are likely reactive. Reproductive: Large complex fluid collection noted in the pelvis which appears to be posterior to the uterus in the cul-de-sac.  This entire complex area measures 13 x 8 cm. This is concerning for abscess. Neither ovary definitively visualized. Other: No free fluid or free air. Musculoskeletal: No acute bony abnormality. IMPRESSION: Large complex fluid collection in the cul-de-sac of the pelvis measuring 13 x 8 cm, new since prior study. This is concerning for tubo-ovarian abscess. Moderate-sized supraumbilical ventral hernia and large umbilical ventral hernia. Hernias contain right colon, transverse colon and numerous small bowel loops. No evidence of bowel obstruction. Cholelithiasis. Shotty retroperitoneal adenopathy, likely reactive. Electronically Signed   By: Franky Crease M.D.   On: 11/22/2023 15:16      Victoria Chavez 11/23/2023  Patient ID: Victoria Chavez, female   DOB: 19-Nov-1980, 43 y.o.   MRN: [id number]

## 2023-11-24 DIAGNOSIS — N7093 Salpingitis and oophoritis, unspecified: Secondary | ICD-10-CM | POA: Diagnosis not present

## 2023-11-24 DIAGNOSIS — K439 Ventral hernia without obstruction or gangrene: Secondary | ICD-10-CM | POA: Diagnosis not present

## 2023-11-24 DIAGNOSIS — E876 Hypokalemia: Secondary | ICD-10-CM | POA: Diagnosis not present

## 2023-11-24 DIAGNOSIS — N730 Acute parametritis and pelvic cellulitis: Secondary | ICD-10-CM | POA: Diagnosis not present

## 2023-11-24 LAB — BASIC METABOLIC PANEL WITH GFR
Anion gap: 10 (ref 5–15)
BUN: <5 mg/dL — ABNORMAL LOW (ref 6–20)
CO2: 24 mmol/L (ref 22–32)
Calcium: 8.3 mg/dL — ABNORMAL LOW (ref 8.9–10.3)
Chloride: 103 mmol/L (ref 98–111)
Creatinine, Ser: 0.65 mg/dL (ref 0.44–1.00)
GFR, Estimated: >60 mL/min (ref >60)
Glucose, Bld: 124 mg/dL — ABNORMAL HIGH (ref 70–99)
Potassium: 3.1 mmol/L — ABNORMAL LOW (ref 3.5–5.1)
Sodium: 137 mmol/L (ref 135–145)

## 2023-11-24 LAB — CBC
HCT: 33.5 % — ABNORMAL LOW (ref 36.0–46.0)
Hemoglobin: 10.3 g/dL — ABNORMAL LOW (ref 12.0–15.0)
MCH: 22.2 pg — ABNORMAL LOW (ref 26.0–34.0)
MCHC: 30.7 g/dL (ref 30.0–36.0)
MCV: 72.4 fL — ABNORMAL LOW (ref 80.0–100.0)
Platelets: 426 K/uL — ABNORMAL HIGH (ref 150–400)
RBC: 4.63 MIL/uL (ref 3.87–5.11)
RDW: 19.7 % — ABNORMAL HIGH (ref 11.5–15.5)
WBC: 12.8 K/uL — ABNORMAL HIGH (ref 4.0–10.5)
nRBC: 0 % (ref 0.0–0.2)

## 2023-11-24 LAB — HEMOGLOBIN A1C
Hgb A1c MFr Bld: 5.8 % — ABNORMAL HIGH (ref 4.8–5.6)
Mean Plasma Glucose: 119.76 mg/dL

## 2023-11-24 LAB — GC/CHLAMYDIA PROBE AMP (~~LOC~~) NOT AT ARMC
Chlamydia: NEGATIVE
Comment: NEGATIVE
Comment: NORMAL
Neisseria Gonorrhea: NEGATIVE

## 2023-11-24 MED ORDER — POLYETHYLENE GLYCOL 3350 17 G PO PACK
17.0000 g | PACK | Freq: Two times a day (BID) | ORAL | Status: DC
  Administered 2023-11-24 (×2): 17 g via ORAL
  Filled 2023-11-24 (×3): qty 1

## 2023-11-24 MED ORDER — SMOG ENEMA
960.0000 mL | Freq: Once | RECTAL | Status: DC
  Filled 2023-11-24: qty 960

## 2023-11-24 MED ORDER — POTASSIUM CHLORIDE CRYS ER 20 MEQ PO TBCR
40.0000 meq | EXTENDED_RELEASE_TABLET | Freq: Two times a day (BID) | ORAL | Status: AC
  Administered 2023-11-24 (×2): 40 meq via ORAL
  Filled 2023-11-24 (×2): qty 2

## 2023-11-24 MED ORDER — DOCUSATE SODIUM 100 MG PO CAPS
100.0000 mg | ORAL_CAPSULE | Freq: Two times a day (BID) | ORAL | Status: DC
  Administered 2023-11-24 (×2): 100 mg via ORAL
  Filled 2023-11-24 (×2): qty 1

## 2023-11-24 NOTE — Progress Notes (Addendum)
 Subjective: CC: Continued pain around her hernia. Just received IV Dilaudid . Tolerating cld without n/v. Passing flatus. No BM in 5d.   Tmax 99.8. No tachycardia or hypotension. WBC downtrending.   She reports hx - Lap UHR with mesh in [place] 2013 - Ex lap, recurrent incisional hernia repair and partial omentectomy by Dr. Rubin in 2021 for  incarcerated, recurrent incisional hernia, purulent ascites  - Recurred ~2 years ago.  - Saw Dr. Rubin in March 2025 for recurrence. They discussed weight loss options before surgery.  - Reports Hernia is always out and painful but not usually this much and is usually she is able to massage it back flat but recurs immediately after.   Objective: Vital signs in last 24 hours: Temp:  [98.3 F (36.8 C)-99.8 F (37.7 C)] 99.8 F (37.7 C) (07/28 0840) Pulse Rate:  [87-96] 88 (07/28 0840) Resp:  [16-22] 22 (07/28 0840) BP: (104-130)/(61-98) 125/98 (07/28 0840) SpO2:  [96 %-98 %] 98 % (07/28 0840) Weight:  [144.3 kg] 144.3 kg (07/28 0637) Last BM Date : 11/19/23  Intake/Output from previous day: 07/27 0701 - 07/28 0700 In: 1831.8 [P.O.:360; I.V.:738.9; IV Piggyback:732.9] Out: -  Intake/Output this shift: Total I/O In: 240 [P.O.:240] Out: -   PE: Gen:  Alert, NAD, pleasant  Abd: Soft, ND, NT except over the hernia. Large umbilical/ventral hernia that is partially reducible with some ttp over but no peritonitis. No overlying erythema or heat.   Lab Results:  Recent Labs    11/23/23 0405 11/24/23 0338  WBC 15.5* 12.8*  HGB 10.8* 10.3*  HCT 36.6 33.5*  PLT 246 426*   BMET Recent Labs    11/23/23 0405 11/24/23 0338  NA 137 137  K 4.3 3.1*  CL 106 103  CO2 18* 24  GLUCOSE 122* 124*  BUN 8 <5*  CREATININE 0.73 0.65  CALCIUM 8.4* 8.3*   PT/INR Recent Labs    11/23/23 0405  LABPROT 15.8*  INR 1.2   CMP     Component Value Date/Time   NA 137 11/24/2023 0338   K 3.1 (L) 11/24/2023 0338   CL 103 11/24/2023 0338    CO2 24 11/24/2023 0338   GLUCOSE 124 (H) 11/24/2023 0338   BUN <5 (L) 11/24/2023 0338   CREATININE 0.65 11/24/2023 0338   CALCIUM 8.3 (L) 11/24/2023 0338   PROT 6.6 11/23/2023 0405   ALBUMIN 2.4 (L) 11/23/2023 0405   AST 34 11/23/2023 0405   ALT 23 11/23/2023 0405   ALKPHOS 164 (H) 11/23/2023 0405   BILITOT 1.0 11/23/2023 0405   GFRNONAA >60 11/24/2023 0338   GFRAA >60 10/28/2019 0422   Lipase     Component Value Date/Time   LIPASE 17 11/22/2023 1259    Studies/Results: DG Abd 2 Views Result Date: 11/23/2023 CLINICAL DATA:  [id number] Ventral incisional hernia [id number] Colonic obstruction (HCC) [id number] EXAM: ABDOMEN - 2 VIEW COMPARISON:  10/27/2019 FINDINGS: Visualized lung bases clear. No free air. Nonobstructive bowel gas pattern. Bowel loops project over the proximal right femur and lateral to the right hemipelvis suggesting large hernia. No abnormal abdominal calcifications. Regional bones unremarkable. IMPRESSION: 1. Nonobstructive bowel gas pattern. 2. Probable large right ventral or spigelian hernia. Electronically Signed   By: JONETTA Faes M.D.   On: 11/23/2023 14:21   US  PELVIC COMPLETE W TRANSVAGINAL AND TORSION R/O Result Date: 11/22/2023 CLINICAL DATA:  Pain EXAM: TRANSABDOMINAL AND TRANSVAGINAL ULTRASOUND OF PELVIS TECHNIQUE: Both transabdominal and transvaginal ultrasound examinations of  the pelvis were performed. Transabdominal technique was performed for global imaging of the pelvis including uterus, ovaries, adnexal regions, and pelvic cul-de-sac. It was necessary to proceed with endovaginal exam following the transabdominal exam to visualize the ovaries. COMPARISON:  CT abdomen and pelvis 11/22/2023. FINDINGS: Uterus Measurements: 7.3 x 2.7 x 3.5 cm = volume: 35 mL. Limited evaluation. No fibroids or other mass visualized. Endometrium Thickness: 9.5 mm.  No focal abnormality visualized. Right ovary Not visualized. Left ovary Measurements: 4.4 x 3.9 x 2.4 cm = volume: 21  mL. Complex low-attenuation mass containing septations and internal debris seen in the left adnexa/left paraovarian region measuring 7.9 x 6.9 x 6.3 cm. Vascular flow is identified in the left ovary. Other findings No abnormal free fluid. IMPRESSION: 1. Complex low-attenuation mass containing septations and internal debris seen in the left adnexa/left paraovarian region measuring 7.9 x 6.9 x 6.3 cm. Findings are indeterminate and may represent tubo-ovarian abscess, although endometrioma and complex or hemorrhagic cysts are also in the differential. 2. Right ovary not visualized. Electronically Signed   By: Greig Pique M.D.   On: 11/22/2023 17:43   CT ABDOMEN PELVIS W CONTRAST Result Date: 11/22/2023 CLINICAL DATA:  Bowel obstruction suspected Hernia suspected, abdominal wall. No bowel movement in 4 days. Vomiting. EXAM: CT ABDOMEN AND PELVIS WITH CONTRAST TECHNIQUE: Multidetector CT imaging of the abdomen and pelvis was performed using the standard protocol following bolus administration of intravenous contrast. RADIATION DOSE REDUCTION: This exam was performed according to the departmental dose-optimization program which includes automated exposure control, adjustment of the mA and/or kV according to patient size and/or use of iterative reconstruction technique. CONTRAST:  OMNIPAQUE  IOHEXOL  350 MG/ML SOLN COMPARISON:  08/12/2023 FINDINGS: Lower chest: No acute findings Hepatobiliary: Multiple gallstones within the gallbladder. No CT evidence of acute cholecystitis. No focal hepatic abnormality. Pancreas: No focal abnormality or ductal dilatation. Spleen: No focal abnormality.  Normal size. Adrenals/Urinary Tract: Adrenal glands normal. 10 mm left lower pole nonobstructing renal stone. No ureteral stones or hydronephrosis. Urinary bladder decompressed, grossly unremarkable. Stomach/Bowel: Left colonic diverticulosis. No active diverticulitis. Stomach and small bowel decompressed. Supra umbilical ventral  hernia present containing transverse colon. There is a large umbilical hernia which contains transverse colon and right colon as well as numerous small bowel loops. No evidence of bowel obstruction. Vascular/Lymphatic: No aneurysm. Shotty retroperitoneal lymph nodes have increased since prior study, with short axis diameter measuring up to 9 mm. These are likely reactive. Reproductive: Large complex fluid collection noted in the pelvis which appears to be posterior to the uterus in the cul-de-sac. This entire complex area measures 13 x 8 cm. This is concerning for abscess. Neither ovary definitively visualized. Other: No free fluid or free air. Musculoskeletal: No acute bony abnormality. IMPRESSION: Large complex fluid collection in the cul-de-sac of the pelvis measuring 13 x 8 cm, new since prior study. This is concerning for tubo-ovarian abscess. Moderate-sized supraumbilical ventral hernia and large umbilical ventral hernia. Hernias contain right colon, transverse colon and numerous small bowel loops. No evidence of bowel obstruction. Cholelithiasis. Shotty retroperitoneal adenopathy, likely reactive. Electronically Signed   By: Franky Crease M.D.   On: 11/22/2023 15:16    Anti-infectives: Anti-infectives (From admission, onward)    Start     Dose/Rate Route Frequency Ordered Stop   11/23/23 0300  doxycycline  (VIBRAMYCIN ) 100 mg in sodium chloride  0.9 % 250 mL IVPB        100 mg 125 mL/hr over 120 Minutes Intravenous Every 12 hours 11/22/23  2010     11/23/23 0300  metroNIDAZOLE  (FLAGYL ) IVPB 500 mg        500 mg 100 mL/hr over 60 Minutes Intravenous Every 12 hours 11/22/23 2010     11/22/23 1530  cefTRIAXone  (ROCEPHIN ) 2 g in sodium chloride  0.9 % 100 mL IVPB        2 g 200 mL/hr over 30 Minutes Intravenous Every 24 hours 11/22/23 1522     11/22/23 1530  metroNIDAZOLE  (FLAGYL ) IVPB 500 mg        500 mg 100 mL/hr over 60 Minutes Intravenous  Once 11/22/23 1522 11/22/23 1729   11/22/23 1530   doxycycline  (VIBRAMYCIN ) 100 mg in sodium chloride  0.9 % 250 mL IVPB        100 mg 125 mL/hr over 120 Minutes Intravenous  Once 11/22/23 1522 11/22/23 1810        Assessment/Plan Large ventral hernia x 2 - CT with evidence of small bowel and colon in hernia sac without evidence of bowel obstruction - No cellulitis on exam  - No indication for emergency surgery today - Ideally we could avoid an operation inpatient given she also has an active infection in her abdomen (TOA) that she is being tx with abx for. If she required surgery inpatient it would likely be and open primary repair with high risk for recurrence given we would not be able to repair with mesh and her hx of morbid obesity.  - If improves with conservative management, would have her follow up with Dr. Rubin to discuss options for repair. Per last office note she was being referred to weight loss surgeons for consultation on surgical weight loss options and discuss potential medical weight loss medications with them prior to surgery.  - Ice to hernia  - We will follow with you  FEN - FLD, possible soft diet this PM. Bowel regimen.  VTE - SCDs, okay for chem ppx from a general surgery standpoint ID - Per primary   TOA - Per GYN. On abx's  - They recommend repeat ultrasound in 6-8wks  History of morbid obesity  I reviewed nursing notes, Consultant (GYN) notes, hospitalist notes, last 24 h vitals and pain scores, last 48 h intake and output, last 24 h labs and trends, and last 24 h imaging results.   LOS: 2 days    Ozell CHRISTELLA Shaper, [facility] 11/24/2023, 9:51 AM Please see Amion for pager number during day hours 7:00am-4:30pm

## 2023-11-24 NOTE — Plan of Care (Signed)

## 2023-11-24 NOTE — Progress Notes (Signed)
 Progress Note   Victoria Chavez  FMW:[id number] DOB: [birth year]  DOA: 11/22/2023 PCP: Ilah Crigler, MD   Brief Narrative:  Victoria Chavez is a 43 y.o. female  with medical history significant for ventral hernia status post mesh hernia repair in 2013 with recurrent incisional hernia repair in April 2021, essential hypertension, chronic iron  deficiency anemia with baseline hemoglobin 8.5-11, who presented to the hospital with abdominal pain, nausea and vomiting. She was found to have sepsis secondary to left-sided tubo-ovarian abscess. Conservative management of hernia at this time with bowel regimen, surgery following. Continues on IV Abx for intraabdominal infection suspicion.  Assessment/Plan:   Principal Problem:   Tubal ovarian abscess Active Problems:   Ventral hernia   Chronic iron  deficiency anemia   Sepsis (HCC)   PID (acute pelvic inflammatory disease)   Hypokalemia   History of essential hypertension  Body mass index is 49.82 kg/m.  (Morbid obesity)  Sepsis secondary to suspected PID/left tubo-ovarian abscess based on CT evidence: - Continue empiric IV antibiotics (ceftriaxone , Flagyl  and doxycycline ).  Appears to be effective as she is afebrile and WBC improving.  -Analgesics as needed for pain.  Follow-up with gynecologist for further recommendations. F/u US  6-8 weeks   Large ventral/umbilical hernia, constipation. No bowel obstruction acutely on imaging.  - added bowel regimen including enema.  Follow-up with general surgeon for further recommendations.   Hypokalemia: Improved. Replete and monitor PRN  Comorbidities include hypertension, chronic iron  deficiency anemia, morbid obesity.   Diet Order             Diet clear liquid Room service appropriate? Yes; Fluid consistency: Thin  Diet effective now                  Consultants: General Surgeon Gynecologist  Procedures: None  Family Communication/Anticipated D/C date and plan/Code Status    DVT prophylaxis: SCDs Start: 11/22/23 2005     Code Status: Full Code  Family Communication: None Disposition Plan: Plan to discharge home  Status is: Inpatient Remains inpatient appropriate because: Tubo-ovarian abscess  Subjective:   Patient reports pain at hernia site. She really wants to have surgery. Endorses passing gas but no BM since admission.   Objective:   Vitals:   11/23/23 2037 11/23/23 2325 11/24/23 0535 11/24/23 0637  BP: 123/69 126/71 104/61   Pulse:   87   Resp: 20 20 16    Temp: 98.9 F (37.2 C) 98.3 F (36.8 C) 98.8 F (37.1 C)   TempSrc: Oral Oral Oral   SpO2:   96%   Weight:    (!) 144.3 kg  Height:        Intake/Output Summary (Last 24 hours) at 11/24/2023 0708 Last data filed at 11/23/2023 1800 Gross per 24 hour  Intake 1831.79 ml  Output --  Net 1831.79 ml   Filed Weights   11/22/23 1218 11/23/23 0500 11/24/23 0637  Weight: (!) 146.5 kg (!) 141.5 kg (!) 144.3 kg   Exam:  GEN: NAD SKIN: Warm and dry EYES: No pallor or icterus ENT: MMM CV: RRR PULM: CTA B ABD: soft, obese, large umbilical hernia with some tenderness, no rebound tenderness or guarding, +BS CNS: AAO x 3, non focal EXT: No edema or tenderness  Data Reviewed:   Basic Metabolic Panel: Recent Labs  Lab 11/22/23 1259 11/22/23 2339 11/23/23 0405 11/24/23 0338  NA 137  --  137 137  K 2.9*  --  4.3 3.1*  CL 102  --  106 103  CO2 23  --  18* 24  GLUCOSE 127*  --  122* 124*  BUN 7  --  8 <5*  CREATININE 0.93  --  0.73 0.65  CALCIUM 8.9  --  8.4* 8.3*  MG  --  2.2 2.2  --    GFR Estimated Creatinine Clearance: 135.6 mL/min (by C-G formula based on SCr of 0.65 mg/dL). Liver Function Tests: Recent Labs  Lab 11/22/23 1259 11/23/23 0405  AST 20 34  ALT 20 23  ALKPHOS 164* 164*  BILITOT 0.8 1.0  PROT 7.7 6.6  ALBUMIN 2.8* 2.4*   Recent Labs  Lab 11/22/23 1259  LIPASE 17   CBC: Recent Labs  Lab 11/22/23 1259 11/23/23 0405 11/24/23 0338  WBC 21.3*  15.5* 12.8*  NEUTROABS 17.1* 12.2*  --   HGB 11.4* 10.8* 10.3*  HCT 37.5 36.6 33.5*  MCV 73.0* 75.2* 72.4*  PLT 487* 246 426*  Microbiology Recent Results (from the past 240 hours)  Wet prep, genital     Status: Abnormal   Collection Time: 11/22/23  3:57 PM   Specimen: PATH Cytology Ancillary Only  Result Value Ref Range Status   Yeast Wet Prep HPF POC NONE SEEN NONE SEEN Final   Trich, Wet Prep NONE SEEN NONE SEEN Final   Clue Cells Wet Prep HPF POC PRESENT (A) NONE SEEN Final   WBC, Wet Prep HPF POC >=10 (A) <10 Final   Sperm NONE SEEN  Final    Comment: Performed at [facility], [address] [place], [place] [address]  Culture, blood (single)     Status: None (Preliminary result)   Collection Time: 11/22/23  4:00 PM   Specimen: BLOOD  Result Value Ref Range Status   Specimen Description BLOOD SITE NOT SPECIFIED  Final   Special Requests   Final    BOTTLES DRAWN AEROBIC AND ANAEROBIC Blood Culture adequate volume   Culture   Final    NO GROWTH < 24 HOURS Performed at [facility], [address] [place], [place] [address]    Report Status PENDING  Incomplete    Procedures and diagnostic studies:  DG Abd 2 Views Result Date: 11/23/2023 CLINICAL DATA:  [id number] Ventral incisional hernia [id number] Colonic obstruction (HCC) [id number] EXAM: ABDOMEN - 2 VIEW COMPARISON:  10/27/2019 FINDINGS: Visualized lung bases clear. No free air. Nonobstructive bowel gas pattern. Bowel loops project over the proximal right femur and lateral to the right hemipelvis suggesting large hernia. No abnormal abdominal calcifications. Regional bones unremarkable. IMPRESSION: 1. Nonobstructive bowel gas pattern. 2. Probable large right ventral or spigelian hernia. Electronically Signed   By: JONETTA Faes M.D.   On: 11/23/2023 14:21   US  PELVIC COMPLETE W TRANSVAGINAL AND TORSION R/O Result Date: 11/22/2023 CLINICAL DATA:  Pain EXAM: TRANSABDOMINAL AND TRANSVAGINAL ULTRASOUND OF PELVIS  TECHNIQUE: Both transabdominal and transvaginal ultrasound examinations of the pelvis were performed. Transabdominal technique was performed for global imaging of the pelvis including uterus, ovaries, adnexal regions, and pelvic cul-de-sac. It was necessary to proceed with endovaginal exam following the transabdominal exam to visualize the ovaries. COMPARISON:  CT abdomen and pelvis 11/22/2023. FINDINGS: Uterus Measurements: 7.3 x 2.7 x 3.5 cm = volume: 35 mL. Limited evaluation. No fibroids or other mass visualized. Endometrium Thickness: 9.5 mm.  No focal abnormality visualized. Right ovary Not visualized. Left ovary Measurements: 4.4 x 3.9 x 2.4 cm = volume: 21 mL. Complex low-attenuation mass containing septations and internal debris seen in the left adnexa/left  paraovarian region measuring 7.9 x 6.9 x 6.3 cm. Vascular flow is identified in the left ovary. Other findings No abnormal free fluid. IMPRESSION: 1. Complex low-attenuation mass containing septations and internal debris seen in the left adnexa/left paraovarian region measuring 7.9 x 6.9 x 6.3 cm. Findings are indeterminate and may represent tubo-ovarian abscess, although endometrioma and complex or hemorrhagic cysts are also in the differential. 2. Right ovary not visualized. Electronically Signed   By: Greig Pique M.D.   On: 11/22/2023 17:43   CT ABDOMEN PELVIS W CONTRAST Result Date: 11/22/2023 CLINICAL DATA:  Bowel obstruction suspected Hernia suspected, abdominal wall. No bowel movement in 4 days. Vomiting. EXAM: CT ABDOMEN AND PELVIS WITH CONTRAST TECHNIQUE: Multidetector CT imaging of the abdomen and pelvis was performed using the standard protocol following bolus administration of intravenous contrast. RADIATION DOSE REDUCTION: This exam was performed according to the departmental dose-optimization program which includes automated exposure control, adjustment of the mA and/or kV according to patient size and/or use of iterative  reconstruction technique. CONTRAST:  OMNIPAQUE  IOHEXOL  350 MG/ML SOLN COMPARISON:  08/12/2023 FINDINGS: Lower chest: No acute findings Hepatobiliary: Multiple gallstones within the gallbladder. No CT evidence of acute cholecystitis. No focal hepatic abnormality. Pancreas: No focal abnormality or ductal dilatation. Spleen: No focal abnormality.  Normal size. Adrenals/Urinary Tract: Adrenal glands normal. 10 mm left lower pole nonobstructing renal stone. No ureteral stones or hydronephrosis. Urinary bladder decompressed, grossly unremarkable. Stomach/Bowel: Left colonic diverticulosis. No active diverticulitis. Stomach and small bowel decompressed. Supra umbilical ventral hernia present containing transverse colon. There is a large umbilical hernia which contains transverse colon and right colon as well as numerous small bowel loops. No evidence of bowel obstruction. Vascular/Lymphatic: No aneurysm. Shotty retroperitoneal lymph nodes have increased since prior study, with short axis diameter measuring up to 9 mm. These are likely reactive. Reproductive: Large complex fluid collection noted in the pelvis which appears to be posterior to the uterus in the cul-de-sac. This entire complex area measures 13 x 8 cm. This is concerning for abscess. Neither ovary definitively visualized. Other: No free fluid or free air. Musculoskeletal: No acute bony abnormality. IMPRESSION: Large complex fluid collection in the cul-de-sac of the pelvis measuring 13 x 8 cm, new since prior study. This is concerning for tubo-ovarian abscess. Moderate-sized supraumbilical ventral hernia and large umbilical ventral hernia. Hernias contain right colon, transverse colon and numerous small bowel loops. No evidence of bowel obstruction. Cholelithiasis. Shotty retroperitoneal adenopathy, likely reactive. Electronically Signed   By: Franky Crease M.D.   On: 11/22/2023 15:16    LOS: 2 days   Ashton Belote L [employer] on  [email or web address]. If 7PM-7AM, please contact night-coverage at [email or web address]   11/24/2023, 7:08 AM

## 2023-11-24 NOTE — Progress Notes (Signed)
 Subjective: Patient reports tolerating PO, + flatus, and no problems voiding has had a small BM today. She states her abdominal pain has significantly improved.   S/p hysteroscopy D&C Polypectomy 10/08/23 with benign path revealing endometrial polyp LMP 11/10/23 normal flow and only 5 days compared to her usual 7 days.  She had prolonged and heavy bleeding prior to procedure on 10/08/23. Denies F/C/N/V/D. Abdominal pain started 4 days ago and worsened with abdominal hernia getting increasingly bigger until about 2 days ago when the pain was excruciating and brought her to the hospital.  Pt passed flatus.  No BM for 4 days but most recent BM was today.  Size of hernia went from about a golf to tangerine size to greater than what looks like at least 10cm (maybe about 15 cms) on exam.  Pt seen at the request of Dr. Donnice Hughes.  Objective: I have reviewed patient's vital signs.  General: alert, cooperative, and no distress Resp: unlabored breathing Cardio: regular rate and rhythm GI: large right sided hernia about 15cm with bowel, warm and mildly tender Extremities: no calf tenderness Vaginal Bleeding: none   Assessment/Plan: HD #3 patient of Dr. Rexene Hoit at [facility] admitted to medicine for sepsis protocol with worsening abdominal pain likely d/t large right sided non-obstructive hernia followed by general surgery.  Pt states surgery may be planned and per her report they want to coordinate gastric sleeve with hernia repair. BMI 50.59  TOA vs Endometrioma vs Hemorrhagic cyst on ultrasound.  CT favored TOA.  Currently on rocephin , doxy and flagy with decreased WBC from 21.3>15.5>12.8 today.  Pt has been afebrile and Tmax this admission 99.1.  Recommend follow WBC and cont abx with total of 14 days of treatment.  Will need a repeat ultrasound in 6-8wks but more pressing is management of pt's hernia.  Blood and urine cultures pending.  For treatment of TOA, consider switching to oral  treatment when:  - The patient's clinical picture improves - Afebrile for at least 24-48 hours - Able to tolerate oral medications - Able to comply with out patient follow up appts.    LOS: 2 days    Rojean Sacramento, DO 11/24/2023, 5:45 PM

## 2023-11-25 ENCOUNTER — Telehealth: Payer: Self-pay | Admitting: Hematology

## 2023-11-25 DIAGNOSIS — N7093 Salpingitis and oophoritis, unspecified: Secondary | ICD-10-CM | POA: Diagnosis not present

## 2023-11-25 LAB — CBC
HCT: 32.3 % — ABNORMAL LOW (ref 36.0–46.0)
Hemoglobin: 10 g/dL — ABNORMAL LOW (ref 12.0–15.0)
MCH: 22.2 pg — ABNORMAL LOW (ref 26.0–34.0)
MCHC: 31 g/dL (ref 30.0–36.0)
MCV: 71.8 fL — ABNORMAL LOW (ref 80.0–100.0)
Platelets: 442 K/uL — ABNORMAL HIGH (ref 150–400)
RBC: 4.5 MIL/uL (ref 3.87–5.11)
RDW: 19.8 % — ABNORMAL HIGH (ref 11.5–15.5)
WBC: 13.6 K/uL — ABNORMAL HIGH (ref 4.0–10.5)
nRBC: 0 % (ref 0.0–0.2)

## 2023-11-25 LAB — BASIC METABOLIC PANEL WITH GFR
Anion gap: 12 (ref 5–15)
BUN: <5 mg/dL — ABNORMAL LOW (ref 6–20)
CO2: 23 mmol/L (ref 22–32)
Calcium: 8.3 mg/dL — ABNORMAL LOW (ref 8.9–10.3)
Chloride: 102 mmol/L (ref 98–111)
Creatinine, Ser: 0.59 mg/dL (ref 0.44–1.00)
GFR, Estimated: >60 mL/min (ref >60)
Glucose, Bld: 126 mg/dL — ABNORMAL HIGH (ref 70–99)
Potassium: 3.1 mmol/L — ABNORMAL LOW (ref 3.5–5.1)
Sodium: 137 mmol/L (ref 135–145)

## 2023-11-25 MED ORDER — POTASSIUM CHLORIDE CRYS ER 20 MEQ PO TBCR
40.0000 meq | EXTENDED_RELEASE_TABLET | Freq: Three times a day (TID) | ORAL | Status: AC
  Administered 2023-11-25 – 2023-11-26 (×3): 40 meq via ORAL
  Filled 2023-11-25 (×3): qty 2

## 2023-11-25 MED ORDER — WHITE PETROLATUM EX OINT
TOPICAL_OINTMENT | CUTANEOUS | Status: DC | PRN
  Filled 2023-11-25: qty 28.35

## 2023-11-25 NOTE — Plan of Care (Signed)
   Problem: Elimination: Goal: Will not experience complications related to bowel motility Outcome: Progressing   Problem: Pain Managment: Goal: General experience of comfort will improve and/or be controlled Outcome: Progressing   Problem: Safety: Goal: Ability to remain free from injury will improve Outcome: Progressing

## 2023-11-25 NOTE — Progress Notes (Signed)
 Attempted to call and give report to RN on [facility], with no answer. Will try again.

## 2023-11-25 NOTE — TOC CM/SW Note (Signed)
 Transition of Care [facility]) - Inpatient Brief Assessment   Patient Details  Name: Lashaun Poch MRN: [id number] Date of Birth: [birth year]  Transition of [facility]) CM/SW Contact:    Lauraine FORBES Saa, LCSW Phone Number: 11/25/2023, 2:40 PM   Clinical Narrative:  2:40 PM Per chart review, patient has a PCP and insurance. Patient does not have SNF/HH/DME history. Patient's preferred pharmacy is [facility] [address]. No TOC needs were identified at this time. TOC will continue to follow and be available to assist.  Transition of Care Asessment: Insurance and Status: Insurance coverage has been reviewed Patient has primary care physician: Yes Home environment has been reviewed: Private Residence Prior level of function:: N/A Prior/Current Home Services: No current home services Social Drivers of Health Review: SDOH reviewed no interventions necessary Readmission risk has been reviewed: Yes Transition of care needs: no transition of care needs at this time

## 2023-11-25 NOTE — Progress Notes (Signed)
 Subjective: CC: Continued pain around her hernia. Has not required PRN narcotic pain medication in the last 24 hours. Tolerating fld without n/v or worsening pain. Passing flatus. Several loose BM's yesterday.   Objective: Vital signs in last 24 hours: Temp:  [97.7 F (36.5 C)-99.6 F (37.6 C)] 97.7 F (36.5 C) (07/29 0822) Pulse Rate:  [86-91] 88 (07/29 0822) Resp:  [18-21] 21 (07/29 0822) BP: (123-152)/(70-88) 152/76 (07/29 0822) SpO2:  [97 %-98 %] 98 % (07/29 0822) Weight:  [143.1 kg] 143.1 kg (07/29 0352) Last BM Date : 11/24/23  Intake/Output from previous day: 07/28 0701 - 07/29 0700 In: 1603.4 [P.O.:720; IV Piggyback:883.4] Out: -  Intake/Output this shift: No intake/output data recorded.  PE: Gen:  Alert, NAD, pleasant Abd: Soft, ND, NT except over the hernia. Large umbilical/ventral hernia that is partially reducible with some ttp over but no peritonitis. No overlying erythema or heat.   Lab Results:  Recent Labs    11/24/23 0338 11/25/23 0312  WBC 12.8* 13.6*  HGB 10.3* 10.0*  HCT 33.5* 32.3*  PLT 426* 442*   BMET Recent Labs    11/24/23 0338 11/25/23 0312  NA 137 137  K 3.1* 3.1*  CL 103 102  CO2 24 23  GLUCOSE 124* 126*  BUN <5* <5*  CREATININE 0.65 0.59  CALCIUM 8.3* 8.3*   PT/INR Recent Labs    11/23/23 0405  LABPROT 15.8*  INR 1.2   CMP     Component Value Date/Time   NA 137 11/25/2023 0312   K 3.1 (L) 11/25/2023 0312   CL 102 11/25/2023 0312   CO2 23 11/25/2023 0312   GLUCOSE 126 (H) 11/25/2023 0312   BUN <5 (L) 11/25/2023 0312   CREATININE 0.59 11/25/2023 0312   CALCIUM 8.3 (L) 11/25/2023 0312   PROT 6.6 11/23/2023 0405   ALBUMIN 2.4 (L) 11/23/2023 0405   AST 34 11/23/2023 0405   ALT 23 11/23/2023 0405   ALKPHOS 164 (H) 11/23/2023 0405   BILITOT 1.0 11/23/2023 0405   GFRNONAA >60 11/25/2023 0312   GFRAA >60 10/28/2019 0422   Lipase     Component Value Date/Time   LIPASE 17 11/22/2023 1259     Studies/Results: DG Abd 2 Views Result Date: 11/23/2023 CLINICAL DATA:  [id number] Ventral incisional hernia [id number] Colonic obstruction (HCC) [id number] EXAM: ABDOMEN - 2 VIEW COMPARISON:  10/27/2019 FINDINGS: Visualized lung bases clear. No free air. Nonobstructive bowel gas pattern. Bowel loops project over the proximal right femur and lateral to the right hemipelvis suggesting large hernia. No abnormal abdominal calcifications. Regional bones unremarkable. IMPRESSION: 1. Nonobstructive bowel gas pattern. 2. Probable large right ventral or spigelian hernia. Electronically Signed   By: JONETTA Faes M.D.   On: 11/23/2023 14:21    Anti-infectives: Anti-infectives (From admission, onward)    Start     Dose/Rate Route Frequency Ordered Stop   11/23/23 0300  doxycycline  (VIBRAMYCIN ) 100 mg in sodium chloride  0.9 % 250 mL IVPB        100 mg 125 mL/hr over 120 Minutes Intravenous Every 12 hours 11/22/23 2010     11/23/23 0300  metroNIDAZOLE  (FLAGYL ) IVPB 500 mg        500 mg 100 mL/hr over 60 Minutes Intravenous Every 12 hours 11/22/23 2010     11/22/23 1530  cefTRIAXone  (ROCEPHIN ) 2 g in sodium chloride  0.9 % 100 mL IVPB        2 g 200 mL/hr over 30 Minutes Intravenous Every  24 hours 11/22/23 1522     11/22/23 1530  metroNIDAZOLE  (FLAGYL ) IVPB 500 mg        500 mg 100 mL/hr over 60 Minutes Intravenous  Once 11/22/23 1522 11/22/23 1729   11/22/23 1530  doxycycline  (VIBRAMYCIN ) 100 mg in sodium chloride  0.9 % 250 mL IVPB        100 mg 125 mL/hr over 120 Minutes Intravenous  Once 11/22/23 1522 11/22/23 1810      She reports hx - Lap UHR with mesh in [place] 2013 - Ex lap, recurrent incisional hernia repair and partial omentectomy by Dr. Rubin in 2021 for  incarcerated, recurrent incisional hernia, purulent ascites  - Recurred ~2 years ago.  - Saw Dr. Rubin in March 2025 for recurrence. They discussed weight loss options before surgery.  - Reports Hernia is always out and painful but not  usually this much and is usually she is able to massage it back flat but recurs immediately after.   Assessment/Plan Large ventral hernia x 2 - CT with evidence of small bowel and colon in hernia sac without evidence of bowel obstruction. Patient having bowel function.  - No cellulitis on exam  - No indication for emergency surgery today - Ideally we could avoid an operation inpatient given she also has an active infection in her abdomen (TOA) that she is being tx with abx for. If she required surgery inpatient it would likely be and open primary repair with high risk for recurrence given we would not be able to repair with mesh and her hx of morbid obesity.  - If improves with conservative management, would have her follow up with Dr. Rubin to discuss options for repair. Per last office note she was being referred to weight loss surgeons for consultation on surgical weight loss options and discuss potential medical weight loss medications with them prior to surgery.  - Ice to hernia  - We will follow with you  FEN - Soft diet.  VTE - SCDs, okay for chem ppx from a general surgery standpoint ID - Per primary   TOA - Per GYN.  - They recommend 14d of abx and repeat ultrasound in 6-8wks  History of morbid obesity  I reviewed nursing notes, Consultant (GYN) notes, hospitalist notes, last 24 h vitals and pain scores, last 48 h intake and output, last 24 h labs and trends, and last 24 h imaging results.   LOS: 3 days    Victoria Chavez Shaper, [facility] 11/25/2023, 9:54 AM Please see Amion for pager number during day hours 7:00am-4:30pm

## 2023-11-25 NOTE — Progress Notes (Addendum)
 Subjective:Victoria Chavez reports improvement in abdominal pain. The pain she experienced when she presented was located at the site of her large ventral hernia. She denies any deep pelvic pain previously or now. She has had a bowel movement since admission. She has tolerated liquids and soft solids today. She denies vaginal bleeding. Her last menstrual period was July 17 lasting 5 days and bleeding was moderate.   Vitals:   11/25/23 0352 11/25/23 0822 11/25/23 1136 11/25/23 1622  BP: 123/79 (!) 152/76 (!) 146/84 133/86  Pulse: 86 88 84 82  Resp: 18 (!) 21 16 17   Temp: 98.6 F (37 C) 97.7 F (36.5 C) 98.4 F (36.9 C) 98.3 F (36.8 C)  TempSrc: Oral Oral Oral   SpO2: 97% 98% 100% 100%  Weight: (!) 143.1 kg     Height:        Objective:  General: Alert No distress  Resp: no distress Abdomen: soft no rebound no guarding.  large right sided hernia approximately 15 cm with bowel  Warm to touch and slightly tender.  Extremities: no edema is present.   Assessment/Plan: HD#4 patient  admitted to medicine for sepsis protocol with worsening abdominal pain likely d/t large right sided non-obstructive hernia followed by general surgery.  Pt states surgery may be planned and per her report they want to coordinate gastric sleeve with hernia repair. BMI 50.59   Pelvic ultrasound with left adnexa/left paraovarian region measuring 7.9 x 6.9 x 6.3 cm. TOA vs Endometrioma vs Hemorrhagic cyst on ultrasound.  Currently on rocephin , doxy and flagy with decreased WBC from 21.3>15.5>12.8.SABRA however 13.6 today.  Pt has been afebrile and Tmax this admission 99.1.  Recommend follow WBC and cont abx with total of 14 days of treatment.  Will need a repeat ultrasound in 6-8wks( Can be done outpatient) . I am not convinced she has a TOA given that her pain has been related to her hernia and she denies any deep pelvic pain. Consider another source for infection.   consider switching to oral treatment when:  - The patient's  clinical picture improves - Afebrile for at least 24-48 hours - Able to tolerate oral medications  Recommend follow up with gyn outpatient in 2 weeks. Plan for repeat ultrasound in 6-8 weeks . GYN will sign off  and follow outpatient. Please do not hesitate to call if further gyn consultation is desired during this admission.

## 2023-11-25 NOTE — Progress Notes (Signed)
 Report given to [facility] RN. Transported patient to [facility] in wheelchair. All belongings bagged and taken with patient. CCMD notified.

## 2023-11-25 NOTE — Telephone Encounter (Signed)
 Rescheduled appointment per provider on-call. Called and left a VM with appointment details for the patient.

## 2023-11-25 NOTE — Progress Notes (Signed)
 Progress Note   Victoria Chavez  FMW:[id number] DOB: [birth year]  DOA: 11/22/2023 PCP: Ilah Crigler, MD   Brief Narrative:  Victoria Chavez is a 43 y.o. female  with medical history significant for ventral hernia status post mesh hernia repair in 2013 with recurrent incisional hernia repair in April 2021, essential hypertension, chronic iron  deficiency anemia with baseline hemoglobin 8.5-11, who presented to the hospital with abdominal pain, nausea and vomiting. She was found to have sepsis secondary to left-sided tubo-ovarian abscess. Conservative management of hernia at this time with bowel regimen, surgery following. Continues on IV Abx for intraabdominal infection suspicion.  Assessment/Plan:   Principal Problem:   Tubal ovarian abscess Active Problems:   Ventral hernia   Chronic iron  deficiency anemia   Sepsis (HCC)   PID (acute pelvic inflammatory disease)   Hypokalemia   History of essential hypertension  Body mass index is 49.41 kg/m.  (Morbid obesity)  Sepsis secondary to suspected PID/left tubo-ovarian abscess based on CT evidence: sepsis criteria resolved.  - Continue empiric IV antibiotics (ceftriaxone , Flagyl  and doxycycline ).  Appears to be effective as she is afebrile and WBC curve improving. Is now experiencing loose stools which may be in response to the Abx.  -Analgesics as needed for pain.  Follow-up with gynecologist for further recommendations. F/u US  6-8 weeks  Large ventral/umbilical hernia, constipation. No bowel obstruction acutely on imaging. Bowel function appears to be intact. Tolerating PO intake, having Bms, and passing gas.  - continue bowel regimen Follow-up with general surgeon for further recommendations. - likely outpatient follow up. Patient is poor surgical candidate and not an urgent need for repair  Hypokalemia: Improved. Replete and monitor PRN  Comorbidities include hypertension, chronic iron  deficiency anemia, morbid obesity.  Diet  Order             Diet full liquid Fluid consistency: Thin  Diet effective now                  Consultants: General Surgeon Gynecologist  Procedures: None  Family Communication/Anticipated D/C date and plan/Code Status   DVT prophylaxis: SCDs Start: 11/22/23 2005    Code Status: Full Code  Family Communication: None Disposition Plan: Plan to discharge home  Status is: Inpatient Remains inpatient appropriate because: Tubo-ovarian abscess  Subjective:   Patient reports continued pain at hernia site. Having bowel movement during multiple visits.   Objective:   Vitals:   11/24/23 1623 11/24/23 2050 11/24/23 2346 11/25/23 0352  BP: 136/77 (!) 146/78 136/70 123/79  Pulse: 88 89 91 86  Resp: 20 20 20 18   Temp: 98.9 F (37.2 C) 99.6 F (37.6 C) 98.5 F (36.9 C) 98.6 F (37 C)  TempSrc: Oral Oral Oral Oral  SpO2: 97% 97% 97% 97%  Weight:    (!) 143.1 kg  Height:        Intake/Output Summary (Last 24 hours) at 11/25/2023 0735 Last data filed at 11/24/2023 1806 Gross per 24 hour  Intake 1603.38 ml  Output --  Net 1603.38 ml   Filed Weights   11/23/23 0500 11/24/23 0637 11/25/23 0352  Weight: (!) 141.5 kg (!) 144.3 kg (!) 143.1 kg   Exam:  GEN: NAD SKIN: Warm and dry EYES: No pallor or icterus ENT: MMM CV: RRR PULM: CTA B ABD: soft, obese, large umbilical hernia with some tenderness, no rebound tenderness or guarding, +BS CNS: AAO x 3, non focal EXT: No edema or tenderness  Data Reviewed:   Basic Metabolic Panel: Recent Labs  Lab 11/22/23 1259 11/22/23 2339 11/23/23 0405 11/24/23 0338 11/25/23 0312  NA 137  --  137 137 137  K 2.9*  --  4.3 3.1* 3.1*  CL 102  --  106 103 102  CO2 23  --  18* 24 23  GLUCOSE 127*  --  122* 124* 126*  BUN 7  --  8 <5* <5*  CREATININE 0.93  --  0.73 0.65 0.59  CALCIUM 8.9  --  8.4* 8.3* 8.3*  MG  --  2.2 2.2  --   --    GFR Estimated Creatinine Clearance: 134.8 mL/min (by C-G formula based on SCr of 0.59  mg/dL). Liver Function Tests: Recent Labs  Lab 11/22/23 1259 11/23/23 0405  AST 20 34  ALT 20 23  ALKPHOS 164* 164*  BILITOT 0.8 1.0  PROT 7.7 6.6  ALBUMIN 2.8* 2.4*   Recent Labs  Lab 11/22/23 1259  LIPASE 17   CBC: Recent Labs  Lab 11/22/23 1259 11/23/23 0405 11/24/23 0338 11/25/23 0312  WBC 21.3* 15.5* 12.8* 13.6*  NEUTROABS 17.1* 12.2*  --   --   HGB 11.4* 10.8* 10.3* 10.0*  HCT 37.5 36.6 33.5* 32.3*  MCV 73.0* 75.2* 72.4* 71.8*  PLT 487* 246 426* 442*  Microbiology Recent Results (from the past 240 hours)  Wet prep, genital     Status: Abnormal   Collection Time: 11/22/23  3:57 PM   Specimen: PATH Cytology Ancillary Only  Result Value Ref Range Status   Yeast Wet Prep HPF POC NONE SEEN NONE SEEN Final   Trich, Wet Prep NONE SEEN NONE SEEN Final   Clue Cells Wet Prep HPF POC PRESENT (A) NONE SEEN Final   WBC, Wet Prep HPF POC >=10 (A) <10 Final   Sperm NONE SEEN  Final    Comment: Performed at [facility], [address] [place], [place] [address]  Culture, blood (single)     Status: None (Preliminary result)   Collection Time: 11/22/23  4:00 PM   Specimen: BLOOD  Result Value Ref Range Status   Specimen Description BLOOD SITE NOT SPECIFIED  Final   Special Requests   Final    BOTTLES DRAWN AEROBIC AND ANAEROBIC Blood Culture adequate volume   Culture   Final    NO GROWTH 2 DAYS Performed at [facility], [address], [place], [place] [address]    Report Status PENDING  Incomplete    Procedures and diagnostic studies:  DG Abd 2 Views Result Date: 11/23/2023 CLINICAL DATA:  [id number] Ventral incisional hernia [id number] Colonic obstruction (HCC) [id number] EXAM: ABDOMEN - 2 VIEW COMPARISON:  10/27/2019 FINDINGS: Visualized lung bases clear. No free air. Nonobstructive bowel gas pattern. Bowel loops project over the proximal right femur and lateral to the right hemipelvis suggesting large hernia. No abnormal abdominal calcifications.  Regional bones unremarkable. IMPRESSION: 1. Nonobstructive bowel gas pattern. 2. Probable large right ventral or spigelian hernia. Electronically Signed   By: JONETTA Faes M.D.   On: 11/23/2023 14:21    LOS: 3 days   Jefferey Lippmann L [employer] on [email or web address]. If 7PM-7AM, please contact night-coverage at [email or web address]   11/25/2023, 7:35 AM

## 2023-11-26 DIAGNOSIS — N730 Acute parametritis and pelvic cellulitis: Secondary | ICD-10-CM | POA: Diagnosis not present

## 2023-11-26 DIAGNOSIS — N7093 Salpingitis and oophoritis, unspecified: Secondary | ICD-10-CM | POA: Diagnosis not present

## 2023-11-26 DIAGNOSIS — E876 Hypokalemia: Secondary | ICD-10-CM | POA: Diagnosis not present

## 2023-11-26 DIAGNOSIS — K439 Ventral hernia without obstruction or gangrene: Secondary | ICD-10-CM | POA: Diagnosis not present

## 2023-11-26 LAB — BASIC METABOLIC PANEL WITH GFR
Anion gap: 8 (ref 5–15)
BUN: 5 mg/dL — ABNORMAL LOW (ref 6–20)
CO2: 24 mmol/L (ref 22–32)
Calcium: 8.7 mg/dL — ABNORMAL LOW (ref 8.9–10.3)
Chloride: 105 mmol/L (ref 98–111)
Creatinine, Ser: 0.63 mg/dL (ref 0.44–1.00)
GFR, Estimated: >60 mL/min (ref >60)
Glucose, Bld: 150 mg/dL — ABNORMAL HIGH (ref 70–99)
Potassium: 3.9 mmol/L (ref 3.5–5.1)
Sodium: 137 mmol/L (ref 135–145)

## 2023-11-26 MED ORDER — POLYETHYLENE GLYCOL 3350 17 G PO PACK: 17.0000 g | PACK | Freq: Every day | ORAL | 0 refills | Status: AC | PRN

## 2023-11-26 MED ORDER — AMOXICILLIN-POT CLAVULANATE ER 1000-62.5 MG PO TB12: 2.0000 | ORAL_TABLET | Freq: Two times a day (BID) | ORAL | 0 refills | Status: DC

## 2023-11-26 NOTE — Plan of Care (Signed)

## 2023-11-26 NOTE — Discharge Summary (Signed)
 Physician Discharge Summary  Patient: Victoria Chavez FMW:[id number] DOB: 09-10-1980   Code Status: Full Code Admit date: 11/22/2023 Discharge date: 11/26/2023 Disposition: Home, No home health services recommended PCP: Ilah Crigler, MD  Recommendations for Outpatient Follow-up:  Follow up with PCP within 1-2 weeks Regarding general hospital follow up and preventative care Recommend BMP, CBC Follow up with OB/GYN in 2 weeks. Repeat US  pelvis 6-8 weeks Follow up with general surgery for ventral hernia and bariatric surgical ongoing evaluations.   Discharge Diagnoses:  Principal Problem:   Tubal ovarian abscess Active Problems:   Ventral hernia   Chronic iron  deficiency anemia   Sepsis (HCC)   PID (acute pelvic inflammatory disease)   Hypokalemia   History of essential hypertension  Brief Hospital Course Summary: Victoria Chavez is a 43 y.o. female  with medical history significant for ventral hernia status post mesh hernia repair in 2013 with recurrent incisional hernia repair in April 2021, essential hypertension, chronic iron  deficiency anemia with baseline hemoglobin 8.5-11, who presented to the hospital with abdominal pain, nausea and vomiting.  Admitted for suspected sepsis 2/2 suspected PID/left tubo-ovarian abscess. She also had significant ventral hernia which she described as larger than baseline but no evidence of bowel obstruction on imaging.  CT scan concerning for: Large complex fluid collection in the cul-de-sac of the pelvis measuring 13 x 8 cm, new since prior study. This is concerning for tubo-ovarian abscess. Moderate-sized supraumbilical ventral hernia and large umbilical ventral hernia. Hernias contain right colon, transverse colon and numerous small bowel loops. No evidence of bowel obstruction. Follow up pelvic US  showed: Complex low-attenuation mass containing septations and internal debris seen in the left adnexa/left paraovarian region measuring 7.9 x 6.9  x 6.3 cm. Findings are indeterminate and may represent tubo-ovarian abscess, although endometrioma and complex or hemorrhagic cysts are also in the differential.  She was treated with IV antibiotics covering intra-abdominal infection followed by PO regimen at dc for 14 total day duration per OB/GYN consult recs.   She described no BM for over 5 days and once started on a bowel regimen to have several bowel movements, she had significant improvement in her abdominal pain and food tolerance.  Sent home with recommendations for continued bowel regimen.  Will follow up with general surgery regarding hernia, bariatric surgery. Did not consider good candidate for surgery currently given no active issues with hernia, active infection, and likely poor outcomes with obesity.  Follow up with ob/gyn for reevaluation of CT findings.   She was found to have sepsis secondary to left-sided tubo-ovarian abscess. Conservative management of hernia at this time with bowel regimen, surgery following. Continues on IV Abx for intraabdominal infection suspicion.   All other chronic conditions were treated with home medications.    Discharge Condition: Good, improved Recommended discharge diet: Regular healthy diet  Consultations: Ob/gyn General surgery  Procedures/Studies: None   Allergies as of 11/26/2023   No Known Allergies      Medication List     STOP taking these medications    IMODIUM PO       TAKE these medications    albuterol  108 (90 Base) MCG/ACT inhaler Commonly known as: VENTOLIN  HFA Inhale 2 puffs into the lungs every 6 (six) hours as needed. For shortness of breath   amLODipine  5 MG tablet Commonly known as: NORVASC  Take 1 tablet (5 mg total) by mouth daily.   amoxicillin -clavulanate 1000-62.5 MG 12 hr tablet Commonly known as: AUGMENTIN  XR Take 2 tablets by mouth  2 (two) times daily for 12 days.   cyanocobalamin  1000 MCG/ML injection Commonly known as: VITAMIN  B12 Inject 1 mL (1,000 mcg total) into the muscle once a week for 35 days, THEN 1 mL (1,000 mcg total) every 30 (thirty) days. Start taking on: October 15, 2023   ibuprofen  800 MG tablet Commonly known as: ADVIL  Take 1 tablet (800 mg total) by mouth every 8 (eight) hours as needed for mild pain (pain score 1-3).   Luer Lock Safety Syringes 22G X 1-1/2 3 ML Misc Generic drug: SYRINGE-NEEDLE (DISP) 3 ML 1 Syringe by Does not apply route once a week for 35 days, THEN 1 Syringe every 30 (thirty) days. Used to administer Cyanocobalamin  (Vitamin B12) injection. Start taking on: October 15, 2023   polyethylene glycol 17 g packet Commonly known as: MIRALAX  / GLYCOLAX  Take 17 g by mouth daily as needed for mild constipation.   spironolactone 25 MG tablet Commonly known as: ALDACTONE Take 25 mg by mouth daily.        Follow-up Information     Rosalva Sawyer, MD. Schedule an appointment as soon as possible for a visit in 2 week(s).   Specialty: Obstetrics and Gynecology Why: Please schedule a hospital follow up with Dr. Rosalva in 2 weeks Contact information: [address] [place] [address] [phone]                Subjective   Pt reports feeling well. She had a large BM this morning and several the day prior. Able to tolerate a full breakfast without abdominal pain nor nausea. Agreeable to going home today and following up with gyn and surgery.   All questions and concerns were addressed at time of discharge.  Objective  Blood pressure (!) 142/97, pulse 84, temperature 97.8 F (36.6 C), temperature source Oral, resp. rate 18, height 5' 7 (1.702 m), weight (!) 144 kg, last menstrual period 11/10/2023, SpO2 100%.   General: Pt is alert, awake, not in acute distress Cardiovascular: RRR, S1/S2 +, no rubs, no gallops Respiratory: CTA bilaterally, no wheezing, no rhonchi Abdominal: Soft, NT. Significant, complex ventral hernia, soft and reducible without tenderness.   Extremities: no edema, no cyanosis  The results of significant diagnostics from this hospitalization (including imaging, microbiology, ancillary and laboratory) are listed below for reference.   Imaging studies: DG Abd 2 Views Result Date: 11/23/2023 CLINICAL DATA:  [id number] Ventral incisional hernia [id number] Colonic obstruction (HCC) [id number] EXAM: ABDOMEN - 2 VIEW COMPARISON:  10/27/2019 FINDINGS: Visualized lung bases clear. No free air. Nonobstructive bowel gas pattern. Bowel loops project over the proximal right femur and lateral to the right hemipelvis suggesting large hernia. No abnormal abdominal calcifications. Regional bones unremarkable. IMPRESSION: 1. Nonobstructive bowel gas pattern. 2. Probable large right ventral or spigelian hernia. Electronically Signed   By: JONETTA Faes M.D.   On: 11/23/2023 14:21   US  PELVIC COMPLETE W TRANSVAGINAL AND TORSION R/O Result Date: 11/22/2023 CLINICAL DATA:  Pain EXAM: TRANSABDOMINAL AND TRANSVAGINAL ULTRASOUND OF PELVIS TECHNIQUE: Both transabdominal and transvaginal ultrasound examinations of the pelvis were performed. Transabdominal technique was performed for global imaging of the pelvis including uterus, ovaries, adnexal regions, and pelvic cul-de-sac. It was necessary to proceed with endovaginal exam following the transabdominal exam to visualize the ovaries. COMPARISON:  CT abdomen and pelvis 11/22/2023. FINDINGS: Uterus Measurements: 7.3 x 2.7 x 3.5 cm = volume: 35 mL. Limited evaluation. No fibroids or other mass visualized. Endometrium Thickness: 9.5 mm.  No  focal abnormality visualized. Right ovary Not visualized. Left ovary Measurements: 4.4 x 3.9 x 2.4 cm = volume: 21 mL. Complex low-attenuation mass containing septations and internal debris seen in the left adnexa/left paraovarian region measuring 7.9 x 6.9 x 6.3 cm. Vascular flow is identified in the left ovary. Other findings No abnormal free fluid. IMPRESSION: 1. Complex low-attenuation  mass containing septations and internal debris seen in the left adnexa/left paraovarian region measuring 7.9 x 6.9 x 6.3 cm. Findings are indeterminate and may represent tubo-ovarian abscess, although endometrioma and complex or hemorrhagic cysts are also in the differential. 2. Right ovary not visualized. Electronically Signed   By: Greig Pique M.D.   On: 11/22/2023 17:43   CT ABDOMEN PELVIS W CONTRAST Result Date: 11/22/2023 CLINICAL DATA:  Bowel obstruction suspected Hernia suspected, abdominal wall. No bowel movement in 4 days. Vomiting. EXAM: CT ABDOMEN AND PELVIS WITH CONTRAST TECHNIQUE: Multidetector CT imaging of the abdomen and pelvis was performed using the standard protocol following bolus administration of intravenous contrast. RADIATION DOSE REDUCTION: This exam was performed according to the departmental dose-optimization program which includes automated exposure control, adjustment of the mA and/or kV according to patient size and/or use of iterative reconstruction technique. CONTRAST:  OMNIPAQUE  IOHEXOL  350 MG/ML SOLN COMPARISON:  08/12/2023 FINDINGS: Lower chest: No acute findings Hepatobiliary: Multiple gallstones within the gallbladder. No CT evidence of acute cholecystitis. No focal hepatic abnormality. Pancreas: No focal abnormality or ductal dilatation. Spleen: No focal abnormality.  Normal size. Adrenals/Urinary Tract: Adrenal glands normal. 10 mm left lower pole nonobstructing renal stone. No ureteral stones or hydronephrosis. Urinary bladder decompressed, grossly unremarkable. Stomach/Bowel: Left colonic diverticulosis. No active diverticulitis. Stomach and small bowel decompressed. Supra umbilical ventral hernia present containing transverse colon. There is a large umbilical hernia which contains transverse colon and right colon as well as numerous small bowel loops. No evidence of bowel obstruction. Vascular/Lymphatic: No aneurysm. Shotty retroperitoneal lymph nodes have  increased since prior study, with short axis diameter measuring up to 9 mm. These are likely reactive. Reproductive: Large complex fluid collection noted in the pelvis which appears to be posterior to the uterus in the cul-de-sac. This entire complex area measures 13 x 8 cm. This is concerning for abscess. Neither ovary definitively visualized. Other: No free fluid or free air. Musculoskeletal: No acute bony abnormality. IMPRESSION: Large complex fluid collection in the cul-de-sac of the pelvis measuring 13 x 8 cm, new since prior study. This is concerning for tubo-ovarian abscess. Moderate-sized supraumbilical ventral hernia and large umbilical ventral hernia. Hernias contain right colon, transverse colon and numerous small bowel loops. No evidence of bowel obstruction. Cholelithiasis. Shotty retroperitoneal adenopathy, likely reactive. Electronically Signed   By: Franky Crease M.D.   On: 11/22/2023 15:16    Labs: Basic Metabolic Panel: Recent Labs  Lab 11/22/23 1259 11/22/23 2339 11/23/23 0405 11/24/23 0338 11/25/23 0312 11/26/23 0522  NA 137  --  137 137 137 137  K 2.9*  --  4.3 3.1* 3.1* 3.9  CL 102  --  106 103 102 105  CO2 23  --  18* 24 23 24   GLUCOSE 127*  --  122* 124* 126* 150*  BUN 7  --  8 <5* <5* 5*  CREATININE 0.93  --  0.73 0.65 0.59 0.63  CALCIUM 8.9  --  8.4* 8.3* 8.3* 8.7*  MG  --  2.2 2.2  --   --   --    CBC: Recent Labs  Lab 11/22/23 1259 11/23/23  0405 11/24/23 0338 11/25/23 0312  WBC 21.3* 15.5* 12.8* 13.6*  NEUTROABS 17.1* 12.2*  --   --   HGB 11.4* 10.8* 10.3* 10.0*  HCT 37.5 36.6 33.5* 32.3*  MCV 73.0* 75.2* 72.4* 71.8*  PLT 487* 246 426* 442*   Microbiology: Results for orders placed or performed during the hospital encounter of 11/22/23  Wet prep, genital     Status: Abnormal   Collection Time: 11/22/23  3:57 PM   Specimen: PATH Cytology Ancillary Only  Result Value Ref Range Status   Yeast Wet Prep HPF POC NONE SEEN NONE SEEN Final   Trich, Wet  Prep NONE SEEN NONE SEEN Final   Clue Cells Wet Prep HPF POC PRESENT (A) NONE SEEN Final   WBC, Wet Prep HPF POC >=10 (A) <10 Final   Sperm NONE SEEN  Final    Comment: Performed at [facility], [address] [place], [place] [address]  Culture, blood (single)     Status: None (Preliminary result)   Collection Time: 11/22/23  4:00 PM   Specimen: BLOOD  Result Value Ref Range Status   Specimen Description BLOOD SITE NOT SPECIFIED  Final   Special Requests   Final    BOTTLES DRAWN AEROBIC AND ANAEROBIC Blood Culture adequate volume   Culture   Final    NO GROWTH 3 DAYS Performed at [facility], [address], [place], [place] [address]    Report Status PENDING  Incomplete    Time coordinating discharge: Over 30 minutes  Marien LITTIE Piety, MD  [facility] 11/26/2023, 10:11 AM

## 2023-11-26 NOTE — TOC Transition Note (Signed)
 Transition of Care [facility]) - Discharge Note   Patient Details  Name: Victoria Chavez MRN: [id number] Date of Birth: [birth year]  [facility] of [facility] CM/SW Contact:  Roxie KANDICE Stain, RN Phone Number: 11/26/2023, 12:02 PM   Clinical Narrative:    Kem Parcher is stable to discharge home. Follow up apt on AVS. No TOC needs at this time.    Final next level of care: Home/Self Care Barriers to Discharge: Barriers Resolved   Patient Goals and CMS Choice Patient states their goals for this hospitalization and ongoing recovery are:: return home          Discharge Placement               home        Discharge Plan and Services Additional resources added to the After Visit Summary for                                       Social Drivers of Health (SDOH) Interventions SDOH Screenings   Food Insecurity: No Food Insecurity (11/23/2023)  Housing: Low Risk  (11/23/2023)  Transportation Needs: No Transportation Needs (11/23/2023)  Utilities: Not At Risk (11/23/2023)  Tobacco Use: Medium Risk (11/22/2023)     Readmission Risk Interventions    11/26/2023   12:01 PM  Readmission Risk Prevention Plan  Post Dischage Appt Complete  Medication Screening Complete  Transportation Screening Complete

## 2023-11-26 NOTE — Progress Notes (Signed)
 Subjective: CC: Pain around her hernia has resolved. She reports she was able to push her hernia back in this morning till it was flat but than recurred spontaneously immedietly after - this is her baseline. Hernia feels back at baseline except slightly bigger. She is tolerating soft diet without n/v or current pain. Passing flatus. BM x 2 this AM. She has not required PRN pain medication since last night.  Objective: Vital signs in last 24 hours: Temp:  [97.8 F (36.6 C)-98.7 F (37.1 C)] 97.8 F (36.6 C) (07/30 0732) Pulse Rate:  [80-90] 84 (07/30 0732) Resp:  [16-19] 18 (07/30 0732) BP: (114-146)/(63-97) 142/97 (07/30 0732) SpO2:  [96 %-100 %] 100 % (07/30 0732) Weight:  [144 kg] 144 kg (07/30 0455) Last BM Date : 11/25/23  Intake/Output from previous day: 07/29 0701 - 07/30 0700 In: 850 [IV Piggyback:850] Out: -  Intake/Output this shift: No intake/output data recorded.  PE: Gen:  Alert, NAD, pleasant Abd: Soft, ND, NT except mild and improved pain over the hernia. Large umbilical/ventral hernia that is partially reducible (patient sitting up in the chair) with some ttp over but no peritonitis. No overlying erythema or heat.   Lab Results:  Recent Labs    11/24/23 0338 11/25/23 0312  WBC 12.8* 13.6*  HGB 10.3* 10.0*  HCT 33.5* 32.3*  PLT 426* 442*   BMET Recent Labs    11/25/23 0312 11/26/23 0522  NA 137 137  K 3.1* 3.9  CL 102 105  CO2 23 24  GLUCOSE 126* 150*  BUN <5* 5*  CREATININE 0.59 0.63  CALCIUM 8.3* 8.7*   PT/INR No results for input(s): LABPROT, INR in the last 72 hours.  CMP     Component Value Date/Time   NA 137 11/26/2023 0522   K 3.9 11/26/2023 0522   CL 105 11/26/2023 0522   CO2 24 11/26/2023 0522   GLUCOSE 150 (H) 11/26/2023 0522   BUN 5 (L) 11/26/2023 0522   CREATININE 0.63 11/26/2023 0522   CALCIUM 8.7 (L) 11/26/2023 0522   PROT 6.6 11/23/2023 0405   ALBUMIN 2.4 (L) 11/23/2023 0405   AST 34 11/23/2023 0405   ALT  23 11/23/2023 0405   ALKPHOS 164 (H) 11/23/2023 0405   BILITOT 1.0 11/23/2023 0405   GFRNONAA >60 11/26/2023 0522   GFRAA >60 10/28/2019 0422   Lipase     Component Value Date/Time   LIPASE 17 11/22/2023 1259    Studies/Results: No results found.   Anti-infectives: Anti-infectives (From admission, onward)    Start     Dose/Rate Route Frequency Ordered Stop   11/23/23 0300  doxycycline  (VIBRAMYCIN ) 100 mg in sodium chloride  0.9 % 250 mL IVPB        100 mg 125 mL/hr over 120 Minutes Intravenous Every 12 hours 11/22/23 2010     11/23/23 0300  metroNIDAZOLE  (FLAGYL ) IVPB 500 mg        500 mg 100 mL/hr over 60 Minutes Intravenous Every 12 hours 11/22/23 2010     11/22/23 1530  cefTRIAXone  (ROCEPHIN ) 2 g in sodium chloride  0.9 % 100 mL IVPB        2 g 200 mL/hr over 30 Minutes Intravenous Every 24 hours 11/22/23 1522     11/22/23 1530  metroNIDAZOLE  (FLAGYL ) IVPB 500 mg        500 mg 100 mL/hr over 60 Minutes Intravenous  Once 11/22/23 1522 11/22/23 1729   11/22/23 1530  doxycycline  (VIBRAMYCIN ) 100 mg in  sodium chloride  0.9 % 250 mL IVPB        100 mg 125 mL/hr over 120 Minutes Intravenous  Once 11/22/23 1522 11/22/23 1810      She reports hx - Lap UHR with mesh in [place] 2013 - Ex lap, recurrent incisional hernia repair and partial omentectomy by Dr. Rubin in 2021 for  incarcerated, recurrent incisional hernia, purulent ascites  - Recurred ~2 years ago.  - Saw Dr. Rubin in March 2025 for recurrence. They discussed weight loss options before surgery.  - Reports Hernia is always out and painful but not usually this much and is usually she is able to massage it back flat but recurs immediately after.   Assessment/Plan Large ventral hernia x 2 - Ideally we could avoid an operation inpatient given she also has an active infection in her abdomen (TOA) that she is being tx with abx for. If she required surgery inpatient it would likely be and open primary repair with high risk for  recurrence given we would not be able to repair with mesh and her hx of morbid obesity.  - CT with evidence of small bowel and colon in hernia sac without evidence of bowel obstruction.  - No cellulitis on exam. Patients pain has resolved, she is tolerating diet and having bowel function.  - No indication for emergency surgery  - General surgery will sign off. Please call back with questions or concerns.  - Patient has been seen in the office previously by Dr. Rubin to discuss options for repair. Per last office note she was being referred to weight loss surgeons for consultation on surgical weight loss options and discuss potential medical weight loss medications with them prior to surgery. Will arrange follow up in the office.   FEN - Soft diet.  VTE - SCDs, okay for chem ppx from a general surgery standpoint ID - Per GYN/primary   TOA - Per GYN.  - They recommend 14d of abx and repeat ultrasound in 6-8wks  History of morbid obesity  I reviewed nursing notes, Consultant (GYN) notes, hospitalist notes, last 24 h vitals and pain scores, last 48 h intake and output, last 24 h labs and trends, and last 24 h imaging results.   LOS: 4 days    Victoria Chavez, [facility] 11/26/2023, 9:17 AM Please see Amion for pager number during day hours 7:00am-4:30pm

## 2023-11-26 NOTE — Discharge Instructions (Signed)
 Please make a 2 week follow up with ob/gyn. Continue your antibiotics until the course is completed.   Please follow up with surgery for further discussions of plans for future procedures for your hernia repair and bariatric surgery

## 2023-11-27 LAB — CULTURE, BLOOD (SINGLE)
Culture: NO GROWTH
Special Requests: ADEQUATE

## 2023-12-02 ENCOUNTER — Other Ambulatory Visit: Payer: Self-pay | Admitting: Hematology

## 2023-12-02 DIAGNOSIS — D509 Iron deficiency anemia, unspecified: Secondary | ICD-10-CM

## 2023-12-02 DIAGNOSIS — E538 Deficiency of other specified B group vitamins: Secondary | ICD-10-CM

## 2023-12-02 NOTE — Assessment & Plan Note (Signed)
-  B12 level were very low in 100-120 range -start B12 injection in

## 2023-12-02 NOTE — Assessment & Plan Note (Addendum)
-   Chronic anemia with hemoglobin 7-8, very low ferritin, consistent with iron  deficiency. Low MCV in 58-75 range  -Hemoglobin electrophoresis was negative. -Likely secondary to menorrhagia and uterine polyps -Received IV iron  in May 2025 -Anemia improved but does not resolve after adequate IV iron , will check alpha thalassemia genetics -Continue to monitor

## 2023-12-02 NOTE — Assessment & Plan Note (Signed)
-   Long history of B12 deficiency, with serum B12 level in low 100's - Started B12 injection in June 2025

## 2023-12-03 ENCOUNTER — Inpatient Hospital Stay (HOSPITAL_BASED_OUTPATIENT_CLINIC_OR_DEPARTMENT_OTHER): Admitting: Hematology

## 2023-12-03 ENCOUNTER — Inpatient Hospital Stay: Admitting: Hematology

## 2023-12-03 ENCOUNTER — Inpatient Hospital Stay

## 2023-12-03 ENCOUNTER — Inpatient Hospital Stay: Attending: Hematology

## 2023-12-03 VITALS — BP 142/82 | HR 86 | Temp 98.1°F | Resp 17 | Ht 67.0 in | Wt 314.9 lb

## 2023-12-03 DIAGNOSIS — E538 Deficiency of other specified B group vitamins: Secondary | ICD-10-CM | POA: Insufficient documentation

## 2023-12-03 DIAGNOSIS — N920 Excessive and frequent menstruation with regular cycle: Secondary | ICD-10-CM | POA: Insufficient documentation

## 2023-12-03 DIAGNOSIS — D509 Iron deficiency anemia, unspecified: Secondary | ICD-10-CM

## 2023-12-03 DIAGNOSIS — D500 Iron deficiency anemia secondary to blood loss (chronic): Secondary | ICD-10-CM | POA: Insufficient documentation

## 2023-12-03 DIAGNOSIS — Z79899 Other long term (current) drug therapy: Secondary | ICD-10-CM | POA: Insufficient documentation

## 2023-12-03 DIAGNOSIS — R112 Nausea with vomiting, unspecified: Secondary | ICD-10-CM | POA: Diagnosis not present

## 2023-12-03 DIAGNOSIS — D649 Anemia, unspecified: Secondary | ICD-10-CM

## 2023-12-03 DIAGNOSIS — K430 Incisional hernia with obstruction, without gangrene: Secondary | ICD-10-CM | POA: Diagnosis not present

## 2023-12-03 LAB — CBC WITH DIFFERENTIAL/PLATELET
Abs Immature Granulocytes: 0.07 K/uL (ref 0.00–0.07)
Basophils Absolute: 0.1 K/uL (ref 0.0–0.1)
Basophils Relative: 0 %
Eosinophils Absolute: 0.1 K/uL (ref 0.0–0.5)
Eosinophils Relative: 1 %
HCT: 36.2 % (ref 36.0–46.0)
Hemoglobin: 11.1 g/dL — ABNORMAL LOW (ref 12.0–15.0)
Immature Granulocytes: 1 %
Lymphocytes Relative: 12 %
Lymphs Abs: 1.5 K/uL (ref 0.7–4.0)
MCH: 22.2 pg — ABNORMAL LOW (ref 26.0–34.0)
MCHC: 30.7 g/dL (ref 30.0–36.0)
MCV: 72.4 fL — ABNORMAL LOW (ref 80.0–100.0)
Monocytes Absolute: 0.9 K/uL (ref 0.1–1.0)
Monocytes Relative: 7 %
Neutro Abs: 9.8 K/uL — ABNORMAL HIGH (ref 1.7–7.7)
Neutrophils Relative %: 79 %
Platelets: 622 K/uL — ABNORMAL HIGH (ref 150–400)
RBC: 5 MIL/uL (ref 3.87–5.11)
RDW: 19.6 % — ABNORMAL HIGH (ref 11.5–15.5)
WBC: 12.4 K/uL — ABNORMAL HIGH (ref 4.0–10.5)
nRBC: 0 % (ref 0.0–0.2)

## 2023-12-03 LAB — FERRITIN: Ferritin: 111 ng/mL (ref 11–307)

## 2023-12-03 LAB — VITAMIN B12: Vitamin B-12: 1822 pg/mL — ABNORMAL HIGH (ref 180–914)

## 2023-12-03 NOTE — Progress Notes (Signed)
 [facility]   Telephone:([phone]) [phone] Fax:([phone]) [phone]   Clinic Follow up Note   Patient Care Team: Ilah Crigler, MD as PCP - General (Family Medicine)  Date of Service:  12/03/2023  CHIEF COMPLAINT: f/u of anemia   CURRENT THERAPY:  B12 injection at home  Oral iron  Iv iron  as needed   Oncology History   Chronic iron  deficiency anemia - Chronic anemia with hemoglobin 7-8, very low ferritin, consistent with iron  deficiency. Low MCV in 58-75 range  -Hemoglobin electrophoresis was negative. -Likely secondary to menorrhagia and uterine polyps -Received IV iron  in May 2025 -Anemia improved but does not resolve after adequate IV iron , will check alpha thalassemia genetics -Continue to monitor  B12 deficiency - Long history of B12 deficiency, with serum B12 level in low 100's - Started B12 injection in June 2025  Assessment & Plan Iron  deficiency anemia Iron  deficiency anemia with hemoglobin at 11.1, below normal. Previous IV iron  infusion improved iron  levels but did not normalize blood counts. Oral iron  is insufficient due to vomiting. Heavy menstrual bleeding due to a polyp was addressed with surgery on June 11th, and her periods have normalized since then. - Review iron  study results - Consider IV iron  infusion if iron  levels are low - Monitor blood counts regularly  Vitamin B12 deficiency Vitamin B12 deficiency managed with weekly B12 injections. Missed a week due to hospitalization but resumed injections at home. Plan to transition to monthly injections after two months of weekly injections. She is managing injections independently. - Continue weekly B12 injections for two months - Transition to monthly B12 injections after two months - Monitor B12 levels - Provide prescription for B12 if needed  Possible thalassemia (pending workup) Possible thalassemia being investigated as a cause of persistent anemia despite iron  supplementation. Blood tests for  thalassemia were drawn today. If confirmed, it may explain the mild anemia in addition to iron  deficiency. - Review results of thalassemia workup - Monitor blood counts   Plan -lab reviewed, Hg 11.1, ferritin pending  -lab every 2 months and f/u in 6 months  - She will continue home B12 injection weekly for total of 8 weeks, then changed to monthly, will refill her if needed - IV iron  as needed if ferritin less than 20   Discussed the use of AI scribe software for clinical note transcription with the patient, who gave verbal consent to proceed.  History of Present Illness Victoria Chavez is a 43 year old female with iron  deficiency anemia who presents for follow-up.  She experienced vomiting this morning after taking her iron  supplement with a protein shake on an empty stomach. This is the first occurrence since her recent hospital visit. She continues with oral iron  supplements and B12 injections at home, administered weekly. She missed a week of B12 injections due to hospitalization but resumed yesterday. She plans to switch to monthly injections after two months of weekly doses. Her periods have normalized since polyp removal surgery on June 11th.     All other systems were reviewed with the patient and are negative.  MEDICAL HISTORY:  Past Medical History:  Diagnosis Date   Abdominal hernia    Dyspnea    albuterol  inhaler prn   HTN (hypertension), benign    IDA (iron  deficiency anemia) 10/02/2023   Morbid obesity (HCC)    Sleep apnea 2007   does not use CPAP    SURGICAL HISTORY: Past Surgical History:  Procedure Laterality Date   HERNIA REPAIR  2013   with mesh   HYSTEROSCOPY WITH D & C N/A 10/08/2023   Procedure: DILATATION AND CURETTAGE /HYSTEROSCOPY;  Surgeon: Rosalva Sawyer, MD;  Location: [place] [facility] OR;  Service: Gynecology;  Laterality: N/A;   INCISIONAL HERNIA REPAIR N/A 08/12/2019   Procedure: Recurrent incisional hernia repair;  Surgeon: Rubin Calamity, MD;  Location:  [facility] OR;  Service: General;  Laterality: N/A;   LAPAROTOMY N/A 08/12/2019   Procedure: EXPLORATORY LAPAROTOMY;  Surgeon: Rubin Calamity, MD;  Location: [facility] OR;  Service: General;  Laterality: N/A;   MELINDA RESECTION N/A 10/08/2023   Procedure: MELINDA RESECTION POLYPECTOMY;  Surgeon: Rosalva Sawyer, MD;  Location: [facility] OR;  Service: Gynecology;  Laterality: N/A;   OMENTECTOMY N/A 08/12/2019   Procedure: Partial Omentectomy;  Surgeon: Rubin Calamity, MD;  Location: [facility] OR;  Service: General;  Laterality: N/A;    I have reviewed the social history and family history with the patient and they are unchanged from previous note.  ALLERGIES:  has no known allergies.  MEDICATIONS:  Current Outpatient Medications  Medication Sig Dispense Refill   albuterol  (PROVENTIL  HFA;VENTOLIN  HFA) 108 (90 BASE) MCG/ACT inhaler Inhale 2 puffs into the lungs every 6 (six) hours as needed. For shortness of breath     amLODipine  (NORVASC ) 5 MG tablet Take 1 tablet (5 mg total) by mouth daily. 90 tablet 1   amoxicillin -clavulanate (AUGMENTIN  XR) 1000-62.5 MG 12 hr tablet Take 2 tablets by mouth 2 (two) times daily for 12 days. 48 tablet 0   cyanocobalamin  (VITAMIN B12) 1000 MCG/ML injection Inject 1 mL (1,000 mcg total) into the muscle once a week for 35 days, THEN 1 mL (1,000 mcg total) every 30 (thirty) days. 1 mL 12   ibuprofen  (ADVIL ) 800 MG tablet Take 1 tablet (800 mg total) by mouth every 8 (eight) hours as needed for mild pain (pain score 1-3). 30 tablet 0   polyethylene glycol (MIRALAX  / GLYCOLAX ) 17 g packet Take 17 g by mouth daily as needed for mild constipation. 14 each 0   spironolactone  (ALDACTONE ) 25 MG tablet Take 25 mg by mouth daily.     SYRINGE-NEEDLE, DISP, 3 ML (LUER LOCK SAFETY SYRINGES) 22G X 1-1/2 3 ML MISC 1 Syringe by Does not apply route once a week for 35 days, THEN 1 Syringe every 30 (thirty) days. Used to administer Cyanocobalamin  (Vitamin B12) injection. 12 each 1   No current  facility-administered medications for this visit.    PHYSICAL EXAMINATION: ECOG PERFORMANCE STATUS: 1 - Symptomatic but completely ambulatory  Vitals:   12/03/23 0913 12/03/23 0915  BP: (!) 171/105 (!) 142/82  Pulse: 86   Resp: 17   Temp: 98.1 F (36.7 C)   SpO2: 100%    Wt Readings from Last 3 Encounters:  12/03/23 (!) 314 lb 14.4 oz (142.8 kg)  11/26/23 (!) 317 lb 7.4 oz (144 kg)  10/09/23 (!) 321 lb (145.6 kg)     GENERAL:alert, no distress and comfortable SKIN: skin color, texture, turgor are normal, no rashes or significant lesions EYES: normal, Conjunctiva are pink and non-injected, sclera clear Musculoskeletal:no cyanosis of digits and no clubbing  NEURO: alert & oriented x 3 with fluent speech, no focal motor/sensory deficits  Physical Exam    LABORATORY DATA:  I have reviewed the data as listed    Latest Ref Rng & Units 12/03/2023    8:51 AM 11/25/2023    3:12 AM 11/24/2023    3:38 AM  CBC  WBC 4.0 - 10.5 K/uL 12.4  13.6  12.8   Hemoglobin 12.0 - 15.0 g/dL 88.8  89.9  89.6   Hematocrit 36.0 - 46.0 % 36.2  32.3  33.5   Platelets 150 - 400 K/uL 622  442  426         Latest Ref Rng & Units 11/26/2023    5:22 AM 11/25/2023    3:12 AM 11/24/2023    3:38 AM  CMP  Glucose 70 - 99 mg/dL 849  873  875   BUN 6 - 20 mg/dL 5  <5  <5   Creatinine 0.44 - 1.00 mg/dL 9.36  9.40  9.34   Sodium 135 - 145 mmol/L 137  137  137   Potassium 3.5 - 5.1 mmol/L 3.9  3.1  3.1   Chloride 98 - 111 mmol/L 105  102  103   CO2 22 - 32 mmol/L 24  23  24    Calcium 8.9 - 10.3 mg/dL 8.7  8.3  8.3       RADIOGRAPHIC STUDIES: I have personally reviewed the radiological images as listed and agreed with the findings in the report. No results found.    No orders of the defined types were placed in this encounter.  All questions were answered. The patient knows to call the clinic with any problems, questions or concerns. No barriers to learning was detected. The total time spent in  the appointment was 15 minutes, including review of chart and various tests results, discussions about plan of care and coordination of care plan     Victoria Mattock, MD 12/03/2023

## 2023-12-05 ENCOUNTER — Inpatient Hospital Stay (HOSPITAL_COMMUNITY)

## 2023-12-05 ENCOUNTER — Inpatient Hospital Stay (HOSPITAL_COMMUNITY)
Admission: EM | Admit: 2023-12-05 | Discharge: 2023-12-17 | DRG: 335 | Disposition: A | Discharge status: Home / Self Care | Attending: Internal Medicine | Admitting: Internal Medicine

## 2023-12-05 ENCOUNTER — Other Ambulatory Visit: Payer: Self-pay

## 2023-12-05 ENCOUNTER — Emergency Department (HOSPITAL_COMMUNITY)

## 2023-12-05 ENCOUNTER — Encounter (HOSPITAL_COMMUNITY): Payer: Self-pay

## 2023-12-05 DIAGNOSIS — K56609 Unspecified intestinal obstruction, unspecified as to partial versus complete obstruction: Secondary | ICD-10-CM | POA: Diagnosis not present

## 2023-12-05 DIAGNOSIS — D75839 Thrombocytosis, unspecified: Secondary | ICD-10-CM

## 2023-12-05 DIAGNOSIS — N170 Acute kidney failure with tubular necrosis: Secondary | ICD-10-CM | POA: Diagnosis not present

## 2023-12-05 DIAGNOSIS — D62 Acute posthemorrhagic anemia: Secondary | ICD-10-CM | POA: Diagnosis not present

## 2023-12-05 DIAGNOSIS — D519 Vitamin B12 deficiency anemia, unspecified: Secondary | ICD-10-CM | POA: Diagnosis present

## 2023-12-05 DIAGNOSIS — I959 Hypotension, unspecified: Secondary | ICD-10-CM | POA: Diagnosis not present

## 2023-12-05 DIAGNOSIS — E861 Hypovolemia: Secondary | ICD-10-CM | POA: Diagnosis present

## 2023-12-05 DIAGNOSIS — R112 Nausea with vomiting, unspecified: Secondary | ICD-10-CM | POA: Diagnosis present

## 2023-12-05 DIAGNOSIS — I10 Essential (primary) hypertension: Secondary | ICD-10-CM | POA: Diagnosis present

## 2023-12-05 DIAGNOSIS — E876 Hypokalemia: Secondary | ICD-10-CM | POA: Diagnosis present

## 2023-12-05 DIAGNOSIS — K439 Ventral hernia without obstruction or gangrene: Secondary | ICD-10-CM | POA: Diagnosis not present

## 2023-12-05 DIAGNOSIS — D72829 Elevated white blood cell count, unspecified: Secondary | ICD-10-CM | POA: Diagnosis present

## 2023-12-05 DIAGNOSIS — D75838 Other thrombocytosis: Secondary | ICD-10-CM | POA: Diagnosis present

## 2023-12-05 DIAGNOSIS — E66813 Obesity, class 3: Secondary | ICD-10-CM | POA: Diagnosis present

## 2023-12-05 DIAGNOSIS — N179 Acute kidney failure, unspecified: Secondary | ICD-10-CM | POA: Diagnosis not present

## 2023-12-05 DIAGNOSIS — N7093 Salpingitis and oophoritis, unspecified: Secondary | ICD-10-CM | POA: Diagnosis present

## 2023-12-05 DIAGNOSIS — D649 Anemia, unspecified: Secondary | ICD-10-CM | POA: Diagnosis not present

## 2023-12-05 DIAGNOSIS — K430 Incisional hernia with obstruction, without gangrene: Secondary | ICD-10-CM | POA: Diagnosis present

## 2023-12-05 DIAGNOSIS — Z79899 Other long term (current) drug therapy: Secondary | ICD-10-CM

## 2023-12-05 DIAGNOSIS — K660 Peritoneal adhesions (postprocedural) (postinfection): Secondary | ICD-10-CM | POA: Diagnosis present

## 2023-12-05 DIAGNOSIS — Z87891 Personal history of nicotine dependence: Secondary | ICD-10-CM

## 2023-12-05 DIAGNOSIS — Z6841 Body Mass Index (BMI) 40.0 and over, adult: Secondary | ICD-10-CM | POA: Diagnosis not present

## 2023-12-05 LAB — CBC WITH DIFFERENTIAL/PLATELET
Abs Immature Granulocytes: 0.06 K/uL (ref 0.00–0.07)
Basophils Absolute: 0 K/uL (ref 0.0–0.1)
Basophils Relative: 0 %
Eosinophils Absolute: 0 K/uL (ref 0.0–0.5)
Eosinophils Relative: 0 %
HCT: 41 % (ref 36.0–46.0)
Hemoglobin: 12.2 g/dL (ref 12.0–15.0)
Immature Granulocytes: 1 %
Lymphocytes Relative: 10 %
Lymphs Abs: 1.1 K/uL (ref 0.7–4.0)
MCH: 22.2 pg — ABNORMAL LOW (ref 26.0–34.0)
MCHC: 29.8 g/dL — ABNORMAL LOW (ref 30.0–36.0)
MCV: 74.7 fL — ABNORMAL LOW (ref 80.0–100.0)
Monocytes Absolute: 0.8 K/uL (ref 0.1–1.0)
Monocytes Relative: 7 %
Neutro Abs: 9.2 K/uL — ABNORMAL HIGH (ref 1.7–7.7)
Neutrophils Relative %: 82 %
Platelets: 750 K/uL — ABNORMAL HIGH (ref 150–400)
RBC: 5.49 MIL/uL — ABNORMAL HIGH (ref 3.87–5.11)
RDW: 19.8 % — ABNORMAL HIGH (ref 11.5–15.5)
WBC: 11.3 K/uL — ABNORMAL HIGH (ref 4.0–10.5)
nRBC: 0 % (ref 0.0–0.2)

## 2023-12-05 LAB — URINALYSIS, ROUTINE W REFLEX MICROSCOPIC
Bilirubin Urine: NEGATIVE
Glucose, UA: NEGATIVE mg/dL
Hgb urine dipstick: NEGATIVE
Ketones, ur: 5 mg/dL — AB
Nitrite: NEGATIVE
Protein, ur: NEGATIVE mg/dL
Specific Gravity, Urine: 1.008 (ref 1.005–1.030)
pH: 6 (ref 5.0–8.0)

## 2023-12-05 LAB — COMPREHENSIVE METABOLIC PANEL WITH GFR
ALT: 14 U/L (ref 0–44)
AST: 17 U/L (ref 15–41)
Albumin: 3.4 g/dL — ABNORMAL LOW (ref 3.5–5.0)
Alkaline Phosphatase: 81 U/L (ref 38–126)
Anion gap: 15 (ref 5–15)
BUN: 9 mg/dL (ref 6–20)
CO2: 25 mmol/L (ref 22–32)
Calcium: 9.6 mg/dL (ref 8.9–10.3)
Chloride: 98 mmol/L (ref 98–111)
Creatinine, Ser: 0.71 mg/dL (ref 0.44–1.00)
GFR, Estimated: >60 mL/min (ref >60)
Glucose, Bld: 139 mg/dL — ABNORMAL HIGH (ref 70–99)
Potassium: 3.6 mmol/L (ref 3.5–5.1)
Sodium: 138 mmol/L (ref 135–145)
Total Bilirubin: 0.2 mg/dL (ref 0.0–1.2)
Total Protein: 8.1 g/dL (ref 6.5–8.1)

## 2023-12-05 LAB — LIPASE, BLOOD: Lipase: 25 U/L (ref 11–51)

## 2023-12-05 LAB — HIV ANTIBODY (ROUTINE TESTING W REFLEX): HIV Screen 4th Generation wRfx: NONREACTIVE

## 2023-12-05 LAB — HCG, SERUM, QUALITATIVE: Preg, Serum: NEGATIVE

## 2023-12-05 MED ORDER — LACTATED RINGERS IV SOLN: INTRAVENOUS | Status: AC

## 2023-12-05 MED ORDER — SPIRONOLACTONE 25 MG PO TABS
25.0000 mg | ORAL_TABLET | Freq: Every day | ORAL | Status: DC
  Administered 2023-12-06: 25 mg via ORAL
  Filled 2023-12-05 (×2): qty 1

## 2023-12-05 MED ORDER — SODIUM CHLORIDE 0.9 % IV BOLUS
1000.0000 mL | Freq: Once | INTRAVENOUS | Status: AC
  Administered 2023-12-05: 1000 mL via INTRAVENOUS

## 2023-12-05 MED ORDER — ENOXAPARIN SODIUM 80 MG/0.8ML IJ SOSY
70.0000 mg | PREFILLED_SYRINGE | INTRAMUSCULAR | Status: DC
  Administered 2023-12-05 – 2023-12-07 (×3): 70 mg via SUBCUTANEOUS
  Filled 2023-12-05 (×4): qty 0.8

## 2023-12-05 MED ORDER — ONDANSETRON HCL 4 MG PO TABS
4.0000 mg | ORAL_TABLET | Freq: Four times a day (QID) | ORAL | Status: DC | PRN
Start: 2023-12-05 — End: 2023-12-17
  Filled 2023-12-05 (×2): qty 1

## 2023-12-05 MED ORDER — KETOROLAC TROMETHAMINE 30 MG/ML IJ SOLN
15.0000 mg | Freq: Four times a day (QID) | INTRAMUSCULAR | Status: DC | PRN
  Administered 2023-12-05 – 2023-12-09 (×10): 15 mg via INTRAVENOUS
  Filled 2023-12-05 (×7): qty 1

## 2023-12-05 MED ORDER — IOHEXOL 350 MG/ML SOLN
75.0000 mL | Freq: Once | INTRAVENOUS | Status: AC | PRN
  Administered 2023-12-05: 75 mL via INTRAVENOUS

## 2023-12-05 MED ORDER — PIPERACILLIN-TAZOBACTAM 3.375 G IVPB
3.3750 g | Freq: Three times a day (TID) | INTRAVENOUS | Status: DC
  Administered 2023-12-05 – 2023-12-11 (×27): 3.375 g via INTRAVENOUS
  Filled 2023-12-05 (×19): qty 50

## 2023-12-05 MED ORDER — ACETAMINOPHEN 500 MG PO TABS: 1000.0000 mg | ORAL_TABLET | Freq: Two times a day (BID) | ORAL | Status: DC | PRN

## 2023-12-05 MED ORDER — MORPHINE SULFATE (PF) 4 MG/ML IV SOLN
4.0000 mg | Freq: Once | INTRAVENOUS | Status: AC
  Administered 2023-12-05: 4 mg via INTRAVENOUS
  Filled 2023-12-05: qty 1

## 2023-12-05 MED ORDER — ALBUTEROL SULFATE (2.5 MG/3ML) 0.083% IN NEBU: 2.5000 mg | INHALATION_SOLUTION | Freq: Four times a day (QID) | RESPIRATORY_TRACT | Status: DC | PRN

## 2023-12-05 MED ORDER — ENOXAPARIN SODIUM 40 MG/0.4ML IJ SOSY: 40.0000 mg | PREFILLED_SYRINGE | INTRAMUSCULAR | Status: DC

## 2023-12-05 MED ORDER — ONDANSETRON HCL 4 MG/2ML IJ SOLN
4.0000 mg | Freq: Four times a day (QID) | INTRAMUSCULAR | Status: DC | PRN
  Administered 2023-12-05 – 2023-12-08 (×7): 4 mg via INTRAVENOUS
  Filled 2023-12-05 (×6): qty 2

## 2023-12-05 MED ORDER — ONDANSETRON HCL 4 MG/2ML IJ SOLN
4.0000 mg | Freq: Once | INTRAMUSCULAR | Status: AC
  Administered 2023-12-05: 4 mg via INTRAVENOUS
  Filled 2023-12-05: qty 2

## 2023-12-05 MED ORDER — ALBUTEROL SULFATE HFA 108 (90 BASE) MCG/ACT IN AERS: 2.0000 | INHALATION_SPRAY | Freq: Four times a day (QID) | RESPIRATORY_TRACT | Status: DC | PRN

## 2023-12-05 MED ORDER — MORPHINE SULFATE (PF) 2 MG/ML IV SOLN
2.0000 mg | INTRAVENOUS | Status: AC | PRN
  Administered 2023-12-05 – 2023-12-06 (×4): 2 mg via INTRAVENOUS
  Filled 2023-12-05 (×4): qty 1

## 2023-12-05 NOTE — ED Notes (Addendum)
 Pt in hallway bed in the ED, Charge RN aware pt needs NG inserted. When this RN took this pt assignment at 1500 no room was available for insertion. Pt denies being in distress at this time. Admitting at bedside at this time aware she does not have tube in place at this time. Will insert tube if room becomes available before pt is transported to their assigned ready bed.

## 2023-12-05 NOTE — Progress Notes (Addendum)
 Progress Note     Subjective: Patient is a 43 year old female previously followed by our service 11/22/23-11/26/23 for large chronic ventral hernia x2. She has had a prior repair with mesh and actually followed up with Dr. Rubin in the office to discuss hernia repair but was recommended to consider bariatric surgery prior to repair for weight loss. During last admission patient was also noted to have PID with TOA being treated with antibiotics. Patient was not acutely obstructed from hernias at that time and in setting of active intra-abdominal infection it was recommended that she continue with previous plan of follow up outpatient to discuss bariatric and hernia repair surgery. At time of discharge 7/30 she was having bowel function with bowel regimen and had improvement in abdominal pain and PO tolerance. Recommended that she continue bowel regimen at home.   Today, she reports no BM since discharge and that she has had nausea and vomiting up fecal matter. Patient stated that this new onset started about 3 days ago. She denies subjective fever but endorses chills. She denies hematemesis or bloody or dark stool. Last BM was 2 days ago.   Objective: Vital signs in last 24 hours: Temp:  [98.1 F (36.7 C)] 98.1 F (36.7 C) (08/08 0929) Pulse Rate:  [79-88] 79 (08/08 1247) Resp:  [16-18] 16 (08/08 1247) BP: (149-174)/(93-109) 149/93 (08/08 1247) SpO2:  [96 %-97 %] 96 % (08/08 1247) Weight:  [142 kg] 142 kg (08/08 0959)    Intake/Output from previous day: No intake/output data recorded. Intake/Output this shift: No intake/output data recorded.  PE: General: pleasant, WD, morbidly obese female who is laying in bed in NAD HEENT: head is normocephalic, atraumatic.  Sclera are noninjected.  Pupils equal and round.  Ears and nose without any masses or lesions.  Mouth is pink and moist Heart: regular, rate, and rhythm. Palpable radial and pedal pulses bilaterally Lungs: CTAB, no wheezes,  rhonchi, or rales noted.  Respiratory effort nonlabored Abd: soft with large ventral hernia x2 that is nonreducible with some TTP. No overlying skin changes or cellulitis. She has generalized abdominal pain with it being mostly tender in the umbilical region deep to the ventral hernia.  MS: all 4 extremities are symmetrical with no cyanosis, clubbing, or edema. Skin: warm and dry with no masses, lesions, or rashes Neuro: Cranial nerves 2-12 grossly intact, sensation is normal throughout Psych: A&Ox3 with an appropriate affect.    Lab Results:  Recent Labs    12/03/23 0851 12/05/23 1014  WBC 12.4* 11.3*  HGB 11.1* 12.2  HCT 36.2 41.0  PLT 622* 750*   BMET Recent Labs    12/05/23 1014  NA 138  K 3.6  CL 98  CO2 25  GLUCOSE 139*  BUN 9  CREATININE 0.71  CALCIUM 9.6   PT/INR No results for input(s): LABPROT, INR in the last 72 hours. CMP     Component Value Date/Time   NA 138 12/05/2023 1014   K 3.6 12/05/2023 1014   CL 98 12/05/2023 1014   CO2 25 12/05/2023 1014   GLUCOSE 139 (H) 12/05/2023 1014   BUN 9 12/05/2023 1014   CREATININE 0.71 12/05/2023 1014   CALCIUM 9.6 12/05/2023 1014   PROT 8.1 12/05/2023 1014   ALBUMIN 3.4 (L) 12/05/2023 1014   AST 17 12/05/2023 1014   ALT 14 12/05/2023 1014   ALKPHOS 81 12/05/2023 1014   BILITOT 0.2 12/05/2023 1014   GFRNONAA >60 12/05/2023 1014   GFRAA >60 10/28/2019  0422   Lipase     Component Value Date/Time   LIPASE 25 12/05/2023 1014       Studies/Results: CT ABDOMEN PELVIS W CONTRAST Result Date: 12/05/2023 CLINICAL DATA:  Hernia suspected, abdominal wall. Complaining of abdominal pain. EXAM: CT ABDOMEN AND PELVIS WITH CONTRAST TECHNIQUE: Multidetector CT imaging of the abdomen and pelvis was performed using the standard protocol following bolus administration of intravenous contrast. RADIATION DOSE REDUCTION: This exam was performed according to the departmental dose-optimization program which includes automated  exposure control, adjustment of the mA and/or kV according to patient size and/or use of iterative reconstruction technique. CONTRAST:  75mL OMNIPAQUE  IOHEXOL  350 MG/ML SOLN COMPARISON:  CT abdomen pelvis 11/22/2023 FINDINGS: Lower chest: Mild volume loss at the left lung base. Otherwise, the visualized lung bases are clear. Hepatobiliary: Multiple gallstones. No evidence for gallbladder inflammation. Normal appearance of the liver. No biliary dilatation. Pancreas: Unremarkable. No pancreatic ductal dilatation or surrounding inflammatory changes. Spleen: Normal in size without focal abnormality. Adrenals/Urinary Tract: Adrenal glands are within normal limits. Normal appearance of the right kidney without stones or hydronephrosis. Calcifications in the posterior left kidney interpolar region. These may represent renal calculi. No hydronephrosis. No suspicious renal lesions. No acute abnormality to the urinary bladder. Stomach/Bowel: 2 large ventral hernias containing loops of bowel. Transverse colon is within the more superior ventral hernia. The lower right paracentral ventral hernia contains the descending colon and distal small bowel. There are new dilated loops of small bowel within the hernia sac with a transition point. In addition, there are dilated loops of small bowel within the intra-abdominal cavity. Small bowel dilatation is new. Small bowel loops measure up to 4.6 cm on image 51/3. Findings are suggestive for a high-grade small bowel obstruction. Normal appearance of the stomach without gastric distension. Mesenteric edema throughout the abdomen. There is probably a small amount of fluid within the lower large ventral hernia. Vascular/Lymphatic: Atherosclerotic disease involving the iliac arteries. Negative for an abdominal aortic aneurysm. Mildly prominent periaortic and retroperitoneal lymph nodes are similar to the comparison examination. Overall, there is no significant lymphadenopathy in the abdomen  or pelvis. Reproductive: Again noted are low-density collections posterior to the uterus. These fluid collections are complex. The largest component on image 69/3 measures approximately 4.9 x 2.8 cm and measured 8.6 x 6.0 cm on 11/22/2023. Additional complex collections near the left adnexa. Based on previous imaging, this may represent decreased size of tubo-ovarian abscess. Overall, these collections appear to be smaller. Other: There appears to be a small amount of fluid in the dependent aspect of the large ventral hernia associated with the small bowel obstruction. Presacral edema. No significant fluid in the pelvis. No evidence for extraluminal air. Musculoskeletal: No acute bone abnormality. IMPRESSION: 1. High-grade small bowel obstruction with a transition point within the large inferior ventral hernia. Small amount of free fluid within the large ventral hernia sac. Mild mesenteric edema. 2. Two large ventral hernias. The superior ventral hernia is near the midline and contains a portion of the transverse colon. The larger inferior right paracentral ventral hernia contains both small and large bowel and there is evidence for a small bowel obstruction and transition point within the large ventral hernia sac. 3. Complex pelvic fluid collections have decreased in size since 11/22/2023. Findings may represent a resolving or improving tubo-ovarian abscess. 4. Cholelithiasis. 5. Indeterminate left renal calcifications.  No hydronephrosis. These results were called by telephone at the time of interpretation on 12/05/2023 at 1:15 pm to provider  Dr. Francesca, Who verbally acknowledged these results. Electronically Signed   By: Juliene Balder M.D.   On: 12/05/2023 13:26    Anti-infectives: Anti-infectives (From admission, onward)    None      She reports hx - Lap UHR with mesh in [place] 2013 - Ex lap, recurrent incisional hernia repair and partial omentectomy by Dr. Rubin in 2021 for  incarcerated, recurrent  incisional hernia, purulent ascites  - Recurred ~2 years ago.  - Saw Dr. Rubin in March 2025 for recurrence. They discussed weight loss options before surgery.  - Reports Hernia is always out and painful, usually she is able to massage it back flat but recurs immediately after. Currently pain is worse compared to normal.   Assessment/Plan Large ventral hernia x 2 - CT today is concerning for  High-grade small bowel obstruction with a transition point within the large inferior ventral hernia. Small amount of free fluid within the large ventral hernia sac. Mild mesenteric edema. Her proximal small bowel however does seem a little more dilated.   - There is no cellulitis on exam  - No indication for emergency surgery today - Ideally we would avoid an operation inpatient given she also has an active infection in her abdomen (TOA) that she is being tx with abx for. If she required surgery inpatient it would likely be an open primary repair with high risk for recurrence given we would not be able to repair with mesh and her hx of morbid obesity.  -Plan to insert NGT tube for decompression then SBO protocol.   - surgery will follow with you   FEN - NPO, NGT to LIWS once placed, IVF per admitting   VTE - SCDs, okay for chem ppx from a general surgery standpoint ID - Per admitting but will need to be IV with NPO status     TOA/PID - Per GYN.  - They recommended 14d of abx and repeat ultrasound in 6-8wks during last admission History of morbid obesity - BMI 49.03 Chronic iron  deficiency anemia/B12 deficiency - being followed by Dr. Lanny and being worked up outpatient for possible thalassemia, has been on PO iron  at home    LOS: 0 days   I reviewed nursing notes, ED provider notes, last 24 h vitals and pain scores, last 48 h intake and output, last 24 h labs and trends, and last 24 h imaging results.    Eulah Hammonds, PA-C  [facility] 12/05/2023, 1:45 PM Please see Amion for  pager number during day hours 7:00am-4:30pm

## 2023-12-05 NOTE — Plan of Care (Signed)

## 2023-12-05 NOTE — ED Notes (Signed)
 Pt complaining of abdominal pain pt recently at [facility] cone for hernia issue. Pt reports it feels like I am throwing up poop

## 2023-12-05 NOTE — ED Notes (Signed)
 Rn placed IV pt tolerated well

## 2023-12-05 NOTE — ED Provider Notes (Addendum)
 [place] EMERGENCY DEPARTMENT AT [facility] Provider Note   CSN: [id number] Arrival date & time: 12/05/23  9073     Patient presents with: No chief complaint on file.   Victoria Chavez is a 43 y.o. Chavez.   The history is provided by the patient and medical records. No language interpreter was used.     43 year old Chavez history of obesity, hernia, anemia presenting to the ER with complaints of nausea and vomiting.  Patient has significant history of ventral hernia status post hernia repair in 2013 with a recurrent incisional hernia repair in 2021, she also was recently admitted to the hospital for abdominal pain with associated nausea and vomiting and found to have a left tubo-ovarian abscess after the CT scan shows a large complex fluid collection in the cul-de-sac of the pelvis measuring 13 x 8 cm.  She does not have any evidence of SBO.  She was treated with antibiotic and discharged home with antibiotic.  Unfortunately after being discharged home she endorsed persistent abdominal discomfort with nausea and vomiting unable to keep any medication down.  She also has not had a bowel movement since being discharged.  She felt she is vomiting up fecal material.  She is still able to pass flatus.  No complaints of fever or chills.  Prior to Admission medications   Medication Sig Start Date End Date Taking? Authorizing Provider  albuterol  (PROVENTIL  HFA;VENTOLIN  HFA) 108 (90 BASE) MCG/ACT inhaler Inhale 2 puffs into the lungs every 6 (six) hours as needed. For shortness of breath    [provider]  amLODipine  (NORVASC ) 5 MG tablet Take 1 tablet (5 mg total) by mouth daily. 10/29/19   Izell Harari, MD  amoxicillin -clavulanate (AUGMENTIN  XR) 1000-Victoria.5 MG 12 hr tablet Take 2 tablets by mouth 2 (two) times daily for 12 days. 11/26/23 12/08/23  Lenon Marien CROME, MD  cyanocobalamin  (VITAMIN B12) 1000 MCG/ML injection Inject 1 mL (1,000 mcg total) into the muscle once a  week for 35 days, THEN 1 mL (1,000 mcg total) every 30 (thirty) days. 10/15/23 10/14/24  Burton, Lacie K, NP  ibuprofen  (ADVIL ) 800 MG tablet Take 1 tablet (800 mg total) by mouth every 8 (eight) hours as needed for mild pain (pain score 1-3). 10/08/23   Rosalva Sawyer, MD  polyethylene glycol (MIRALAX  / GLYCOLAX ) 17 g packet Take 17 g by mouth daily as needed for mild constipation. 11/26/23   Lenon Marien CROME, MD  spironolactone  (ALDACTONE ) 25 MG tablet Take 25 mg by mouth daily. 03/13/23   [provider]  SYRINGE-NEEDLE, DISP, 3 ML (LUER LOCK SAFETY SYRINGES) 22G X 1-1/2 3 ML MISC 1 Syringe by Does not apply route once a week for 35 days, THEN 1 Syringe every 30 (thirty) days. Used to administer Cyanocobalamin  (Vitamin B12) injection. 10/15/23 10/14/24  Burton, Lacie K, NP    Allergies: Patient has no known allergies.    Review of Systems  All other systems reviewed and are negative.   Updated Vital Signs BP (!) 174/109   Pulse 88   Temp 98.1 F (36.7 C)   Resp 18   LMP 11/10/2023 (Exact Date)   SpO2 97%   Physical Exam Vitals and nursing note reviewed.  Constitutional:      General: She is not in acute distress.    Appearance: She is well-developed. She is obese.  HENT:     Head: Atraumatic.  Eyes:     Conjunctiva/sclera: Conjunctivae normal.  Cardiovascular:  Rate and Rhythm: Normal rate and regular rhythm.     Pulses: Normal pulses.     Heart sounds: Normal heart sounds.  Pulmonary:     Effort: Pulmonary effort is normal.     Breath sounds: No wheezing, rhonchi or rales.  Abdominal:     Palpations: Abdomen is soft.     Tenderness: There is abdominal tenderness.  Musculoskeletal:     Cervical back: Neck supple.  Skin:    Findings: No rash.  Neurological:     Mental Status: She is alert.  Psychiatric:        Mood and Affect: Mood normal.     (all labs ordered are listed, but only abnormal results are displayed) Labs Reviewed  CBC WITH  DIFFERENTIAL/PLATELET - Abnormal; Notable for the following components:      Result Value   WBC 11.3 (*)    RBC 5.49 (*)    MCV 74.7 (*)    MCH 22.2 (*)    MCHC 29.8 (*)    RDW 19.8 (*)    Platelets 750 (*)    Neutro Abs 9.2 (*)    All other components within normal limits  COMPREHENSIVE METABOLIC PANEL WITH GFR - Abnormal; Notable for the following components:   Glucose, Bld 139 (*)    Albumin  3.4 (*)    All other components within normal limits  URINALYSIS, ROUTINE W REFLEX MICROSCOPIC - Abnormal; Notable for the following components:   Ketones, ur 5 (*)    Leukocytes,Ua SMALL (*)    Bacteria, UA RARE (*)    All other components within normal limits  LIPASE, BLOOD  HCG, SERUM, QUALITATIVE    EKG: None  Radiology: CT ABDOMEN PELVIS W CONTRAST Result Date: 12/05/2023 CLINICAL DATA:  Hernia suspected, abdominal wall. Complaining of abdominal pain. EXAM: CT ABDOMEN AND PELVIS WITH CONTRAST TECHNIQUE: Multidetector CT imaging of the abdomen and pelvis was performed using the standard protocol following bolus administration of intravenous contrast. RADIATION DOSE REDUCTION: This exam was performed according to the departmental dose-optimization program which includes automated exposure control, adjustment of the mA and/or kV according to patient size and/or use of iterative reconstruction technique. CONTRAST:  75mL OMNIPAQUE  IOHEXOL  350 MG/ML SOLN COMPARISON:  CT abdomen pelvis 11/22/2023 FINDINGS: Lower chest: Mild volume loss at the left lung base. Otherwise, the visualized lung bases are clear. Hepatobiliary: Multiple gallstones. No evidence for gallbladder inflammation. Normal appearance of the liver. No biliary dilatation. Pancreas: Unremarkable. No pancreatic ductal dilatation or surrounding inflammatory changes. Spleen: Normal in size without focal abnormality. Adrenals/Urinary Tract: Adrenal glands are within normal limits. Normal appearance of the right kidney without stones or  hydronephrosis. Calcifications in the posterior left kidney interpolar region. These may represent renal calculi. No hydronephrosis. No suspicious renal lesions. No acute abnormality to the urinary bladder. Stomach/Bowel: 2 large ventral hernias containing loops of bowel. Transverse colon is within the more superior ventral hernia. The lower right paracentral ventral hernia contains the descending colon and distal small bowel. There are new dilated loops of small bowel within the hernia sac with a transition point. In addition, there are dilated loops of small bowel within the intra-abdominal cavity. Small bowel dilatation is new. Small bowel loops measure up to 4.6 cm on image 51/3. Findings are suggestive for a high-grade small bowel obstruction. Normal appearance of the stomach without gastric distension. Mesenteric edema throughout the abdomen. There is probably a small amount of fluid within the lower large ventral hernia. Vascular/Lymphatic: Atherosclerotic disease involving the iliac  arteries. Negative for an abdominal aortic aneurysm. Mildly prominent periaortic and retroperitoneal lymph nodes are similar to the comparison examination. Overall, there is no significant lymphadenopathy in the abdomen or pelvis. Reproductive: Again noted are low-density collections posterior to the uterus. These fluid collections are complex. The largest component on image 69/3 measures approximately 4.9 x 2.8 cm and measured 8.6 x 6.0 cm on 11/22/2023. Additional complex collections near the left adnexa. Based on previous imaging, this may represent decreased size of tubo-ovarian abscess. Overall, these collections appear to be smaller. Other: There appears to be a small amount of fluid in the dependent aspect of the large ventral hernia associated with the small bowel obstruction. Presacral edema. No significant fluid in the pelvis. No evidence for extraluminal air. Musculoskeletal: No acute bone abnormality. IMPRESSION: 1.  High-grade small bowel obstruction with a transition point within the large inferior ventral hernia. Small amount of free fluid within the large ventral hernia sac. Mild mesenteric edema. 2. Two large ventral hernias. The superior ventral hernia is near the midline and contains a portion of the transverse colon. The larger inferior right paracentral ventral hernia contains both small and large bowel and there is evidence for a small bowel obstruction and transition point within the large ventral hernia sac. 3. Complex pelvic fluid collections have decreased in size since 11/22/2023. Findings may represent a resolving or improving tubo-ovarian abscess. 4. Cholelithiasis. 5. Indeterminate left renal calcifications.  No hydronephrosis. These results were called by telephone at the time of interpretation on 12/05/2023 at 1:15 pm to provider Dr. Francesca, Who verbally acknowledged these results. Electronically Signed   By: Juliene Balder M.D.   On: 12/05/2023 13:26     Procedures   Medications Ordered in the ED  morphine  (PF) 4 MG/ML injection 4 mg (4 mg Intravenous Given 12/05/23 1023)  ondansetron  (ZOFRAN ) injection 4 mg (4 mg Intravenous Given 12/05/23 1023)  sodium chloride  0.9 % bolus 1,000 mL (0 mLs Intravenous Stopped 12/05/23 1132)  iohexol  (OMNIPAQUE ) 350 MG/ML injection 75 mL (75 mLs Intravenous Contrast Given 12/05/23 1239)                                    Medical Decision Making Amount and/or Complexity of Data Reviewed Labs: ordered. Radiology: ordered.  Risk Prescription drug management. Decision regarding hospitalization.   BP (!) 174/109   Pulse 88   Temp 98.1 F (36.7 C)   Resp 18   LMP 11/10/2023 (Exact Date)   SpO2 97%   Victoria Chavez:76 AM  43 year old Chavez history of obesity, hernia, anemia presenting to the ER with complaints of nausea and vomiting.  Patient has significant history of ventral hernia status post hernia repair in 2013 with a recurrent incisional hernia repair in 2021, she  also was recently admitted to the hospital for abdominal pain with associated nausea and vomiting and found to have a left tubo-ovarian abscess after the CT scan shows a large complex fluid collection in the cul-de-sac of the pelvis measuring 13 x 8 cm.  She does not have any evidence of SBO.  She was treated with antibiotic and discharged home with antibiotic.  Unfortunately after being discharged home she endorsed persistent abdominal discomfort with nausea and vomiting unable to keep any medication down.  She also has not had a bowel movement since being discharged.  She felt she is vomiting up fecal material.  She is still able to pass flatus.  No complaints of fever or chills.  On exam this is an obese Chavez sitting comfortably in chair appears to be in no acute discomfort.  Heart with normal rate rhythm lungs are clear abdomen is soft but she does have ventral hernia that is tender to palpation.  Bowel sounds decreased.  EMR reviewed patient was discharged on 11/26/2023 after treatment of suspected tubo-ovarian abscess with antibiotic.  At this time I felt patient would likely benefit from repeat CT scan to ensure she is not having any SBO or further complication from her previous tubo-ovarian abscess finding.  -Labs ordered, independently viewed and interpreted by me.  Labs remarkable for elevated white count of 11.3, UA without signs of urinary tract infection, electrolyte panels are mostly reassuring, normal lipase, pregnancy test is negative -The patient was maintained on a cardiac monitor.  I personally viewed and interpreted the cardiac monitored which showed an underlying rhythm of: Sinus rhythm -Imaging independently viewed and interpreted by me and I agree with radiologist's interpretation.  Result remarkable for incarcerated ventral hernia -This patient presents to the ED for concern of abdominal pain, this involves an extensive number of treatment options, and is a complaint that carries  with it a high risk of complications and morbidity.  The differential diagnosis includes SBO, TOA, PID, appendicitis, constipation, UTI, colitis -Co morbidities that complicate the patient evaluation includes ventral hernia -Treatment includes antiemetic, IV fluid, pain medication -Reevaluation of the patient after these medicines showed that the patient improved -PCP office notes or outside notes reviewed -Discussion with specialist general surgery team who request NG tube and they will be involve in pt care.  I have also consulted [facility] Dr. Cherlyn who agrees to admit pt  -Escalation to admission/observation considered: patient agrees with plan.       Final diagnoses:  SBO (small bowel obstruction) [facility])    ED Discharge Orders     None          Nivia Colon, PA-C 12/05/23 1334    Nivia Colon, PA-C 12/05/23 1519    Francesca Elsie CROME, MD 12/06/23 1353

## 2023-12-05 NOTE — H&P (Signed)
 History and Physical    Patient: Victoria Chavez FMW:[id number] DOB: 1980/07/11 DOA: 12/05/2023 DOS: the patient was seen and examined on 12/05/2023 PCP: Ilah Crigler, MD  Patient coming from: Home  Chief Complaint:  Chief Complaint  Patient presents with   Emesis   HPI: Victoria Chavez is a 43 y.o. female with medical history significant of hypertension, morbid obesity, sleep apnea, iron  deficiency anemia, ventral hernia s/p mesh hernia repair in 2013 with recurrent incisional hernia repair in 2021 was  recently discharged  after being treated for left tubo ovarian abscess on 11/26/3023 presents back to ED for nausea, vomiting and abdominal pain.  CT abd and pelvis shows high grade SBO with a transition point within the large inferior ventral hernia.  She also reports chills. She denies fevers, sob and chest pain. She reports some dizziness earlier. She denies any hematochezia, syncopal episodes.   On arrival to ED, she was afebrile, BP around 150/90 mmhg.  Labs reveal mild leukocytosis and thrombocytosis.   CT abd and pelvis shows  High-grade small bowel obstruction with a transition point within the large inferior ventral hernia. Small amount of free fluid within the large ventral hernia sac. Mild mesenteric edema. 2. Two large ventral hernias. The superior ventral hernia is near the midline and contains a portion of the transverse colon. The larger inferior right paracentral ventral hernia contains both small and large bowel and there is evidence for a small bowel obstruction and transition point within the large ventral hernia sac. 3. Complex pelvic fluid collections have decreased in size since 11/22/2023. Findings may represent a resolving or improving tubo-ovarian abscess.  Gen surgery consulted and she was deferred to [facility] for admission.     Review of Systems: As mentioned in the history of present illness. All other systems reviewed and are negative. Past Medical History:   Diagnosis Date   Abdominal hernia    Dyspnea    albuterol  inhaler prn   HTN (hypertension), benign    IDA (iron  deficiency anemia) 10/02/2023   Morbid obesity (HCC)    Sleep apnea 2007   does not use CPAP   Past Surgical History:  Procedure Laterality Date   HERNIA REPAIR  2013   with mesh   HYSTEROSCOPY WITH D & C N/A 10/08/2023   Procedure: DILATATION AND CURETTAGE /HYSTEROSCOPY;  Surgeon: Rosalva Sawyer, MD;  Location[facility] OR;  Service: Gynecology;  Laterality: N/A;   INCISIONAL HERNIA REPAIR N/A 08/12/2019   Procedure: Recurrent incisional hernia repair;  Surgeon: Rubin Calamity, MD;  Location: [facility] OR;  Service: General;  Laterality: N/A;   LAPAROTOMY N/A 08/12/2019   Procedure: EXPLORATORY LAPAROTOMY;  Surgeon: Rubin Calamity, MD;  Location: [facility] OR;  Service: General;  Laterality: N/A;   MELINDA RESECTION N/A 10/08/2023   Procedure: MELINDA RESECTION POLYPECTOMY;  Surgeon: Rosalva Sawyer, MD;  Location: [facility] OR;  Service: Gynecology;  Laterality: N/A;   OMENTECTOMY N/A 08/12/2019   Procedure: Partial Omentectomy;  Surgeon: Rubin Calamity, MD;  Location: [facility] OR;  Service: General;  Laterality: N/A;   Social History:  reports that she has quit smoking. Her smoking use included cigarettes. She has never used smokeless tobacco. She reports current alcohol use of about 1.0 - 2.0 standard drink of alcohol per week. She reports current drug use. Frequency: 14.00 times per week. Drug: Marijuana.  No Known Allergies  History reviewed. No pertinent family history.  Prior to Admission medications   Medication Sig Start Date End Date Taking? Authorizing Provider  acetaminophen  (  TYLENOL ) 500 MG tablet Take 1,000 mg by mouth 2 (two) times daily as needed for moderate pain (pain score 4-6), fever or headache.   Yes [provider]  albuterol  (PROVENTIL  HFA;VENTOLIN  HFA) 108 (90 BASE) MCG/ACT inhaler Inhale 2 puffs into the lungs every 6 (six) hours as needed. For shortness of breath   Yes  [provider]  amLODipine  (NORVASC ) 5 MG tablet Take 1 tablet (5 mg total) by mouth daily. 10/29/19  Yes Izell Harari, MD  amoxicillin -clavulanate (AUGMENTIN  XR) 1000-62.5 MG 12 hr tablet Take 2 tablets by mouth 2 (two) times daily for 12 days. 11/26/23 12/08/23 Yes Lenon Marien CROME, MD  cyanocobalamin  (VITAMIN B12) 1000 MCG/ML injection Inject 1 mL (1,000 mcg total) into the muscle once a week for 35 days, THEN 1 mL (1,000 mcg total) every 30 (thirty) days. 10/15/23 10/14/24 Yes Burton, Lacie K, NP  ferrous sulfate 325 (65 FE) MG tablet Take 325 mg by mouth daily.   Yes [provider]  ibuprofen  (ADVIL ) 800 MG tablet Take 1 tablet (800 mg total) by mouth every 8 (eight) hours as needed for mild pain (pain score 1-3). 10/08/23  Yes Rosalva Sawyer, MD  polyethylene glycol (MIRALAX  / GLYCOLAX ) 17 g packet Take 17 g by mouth daily as needed for mild constipation. 11/26/23  Yes Lenon Marien CROME, MD  spironolactone  (ALDACTONE ) 25 MG tablet Take 25 mg by mouth daily. 03/13/23  Yes [provider]    Physical Exam: Vitals:   12/05/23 0929 12/05/23 0959 12/05/23 1247  BP: (!) 174/109  (!) 149/93  Pulse: 88  79  Resp: 18  16  Temp: 98.1 F (36.7 C)    SpO2: 97%  96%  Weight:  (!) 142 kg   Height:  5' 7 (1.702 m)    General exam:  morbidly obese lady, not in distress.  Respiratory system: Clear to auscultation. Respiratory effort normal. Cardiovascular system: S1 & S2 heard, RRR. No JVD, Gastrointestinal system: Abdomen is soft, distended, no bowel sounds. Central nervous system: Alert and oriented.  Extremities: Symmetric 5 x 5 power. Skin: No rashes,  Psychiatry: Judgement and insight appear normal. Mood & affect appropriate.   Data Reviewed: Results for orders placed or performed during the hospital encounter of 12/05/23 (from the past 24 hours)  CBC with Differential     Status: Abnormal   Collection Time: 12/05/23 10:14 AM  Result Value Ref Range   WBC 11.3  (H) 4.0 - 10.5 K/uL   RBC 5.49 (H) 3.87 - 5.11 MIL/uL   Hemoglobin 12.2 12.0 - 15.0 g/dL   HCT 58.9 63.9 - 53.9 %   MCV 74.7 (L) 80.0 - 100.0 fL   MCH 22.2 (L) 26.0 - 34.0 pg   MCHC 29.8 (L) 30.0 - 36.0 g/dL   RDW 80.1 (H) 88.4 - 84.4 %   Platelets 750 (H) 150 - 400 K/uL   nRBC 0.0 0.0 - 0.2 %   Neutrophils Relative % 82 %   Neutro Abs 9.2 (H) 1.7 - 7.7 K/uL   Lymphocytes Relative 10 %   Lymphs Abs 1.1 0.7 - 4.0 K/uL   Monocytes Relative 7 %   Monocytes Absolute 0.8 0.1 - 1.0 K/uL   Eosinophils Relative 0 %   Eosinophils Absolute 0.0 0.0 - 0.5 K/uL   Basophils Relative 0 %   Basophils Absolute 0.0 0.0 - 0.1 K/uL   Immature Granulocytes 1 %   Abs Immature Granulocytes 0.06 0.00 - 0.07 K/uL  Comprehensive metabolic panel  Status: Abnormal   Collection Time: 12/05/23 10:14 AM  Result Value Ref Range   Sodium 138 135 - 145 mmol/L   Potassium 3.6 3.5 - 5.1 mmol/L   Chloride 98 98 - 111 mmol/L   CO2 25 22 - 32 mmol/L   Glucose, Bld 139 (H) 70 - 99 mg/dL   BUN 9 6 - 20 mg/dL   Creatinine, Ser 9.28 0.44 - 1.00 mg/dL   Calcium 9.6 8.9 - 89.6 mg/dL   Total Protein 8.1 6.5 - 8.1 g/dL   Albumin 3.4 (L) 3.5 - 5.0 g/dL   AST 17 15 - 41 U/L   ALT 14 0 - 44 U/L   Alkaline Phosphatase 81 38 - 126 U/L   Total Bilirubin 0.2 0.0 - 1.2 mg/dL   GFR, Estimated >39 >39 mL/min   Anion gap 15 5 - 15  Lipase, blood     Status: None   Collection Time: 12/05/23 10:14 AM  Result Value Ref Range   Lipase 25 11 - 51 U/L  hCG, serum, qualitative     Status: None   Collection Time: 12/05/23 10:14 AM  Result Value Ref Range   Preg, Serum NEGATIVE NEGATIVE  Urinalysis, Routine w reflex microscopic -Urine, Clean Catch     Status: Abnormal   Collection Time: 12/05/23 12:14 PM  Result Value Ref Range   Color, Urine YELLOW YELLOW   APPearance CLEAR CLEAR   Specific Gravity, Urine 1.008 1.005 - 1.030   pH 6.0 5.0 - 8.0   Glucose, UA NEGATIVE NEGATIVE mg/dL   Hgb urine dipstick NEGATIVE NEGATIVE    Bilirubin Urine NEGATIVE NEGATIVE   Ketones, ur 5 (A) NEGATIVE mg/dL   Protein, ur NEGATIVE NEGATIVE mg/dL   Nitrite NEGATIVE NEGATIVE   Leukocytes,Ua SMALL (A) NEGATIVE   RBC / HPF 0-5 0 - 5 RBC/hpf   WBC, UA 0-5 0 - 5 WBC/hpf   Bacteria, UA RARE (A) NONE SEEN   Squamous Epithelial / HPF 0-5 0 - 5 /HPF   Mucus PRESENT      Assessment and Plan:   High grade SBO Associated with nausea, vomiting and abdominal pain.  Gen surgery on board.  Conservative management as per general surgery.  Continue with NPO, IV fluids, pain control and anti emetics.  NG tube to be placed and connected to intermittent suction.  Mild leukocytosis.    Thrombocytosis Reactive.    Hypertension:  Optimal.  Restart home meds in am.   Tubo ovarian abscess: Patient was on Augmentin  at home,but was unable to keep it down due to nausea and vomiting for 3 days.  - start patient IV antibiotics.  Body mass index is 49.03 kg/m. Morbid obesity     Advance Care Planning:   Code Status: Full Code   Consults: general surgery.   Family Communication: none at bedside.   Severity of Illness: The appropriate patient status for this patient is INPATIENT. Inpatient status is judged to be reasonable and necessary in order to provide the required intensity of service to ensure the patient's safety. The patient's presenting symptoms, physical exam findings, and initial radiographic and laboratory data in the context of their chronic comorbidities is felt to place them at high risk for further clinical deterioration. Furthermore, it is not anticipated that the patient will be medically stable for discharge from the hospital within 2 midnights of admission.   * I certify that at the point of admission it is my clinical judgment that the patient will require inpatient  hospital care spanning beyond 2 midnights from the point of admission due to high intensity of service, high risk for further deterioration and high  frequency of surveillance required.*  Author: Dalonte Hardage, MD 12/05/2023 3:46 PM  For on call review [email or web address].

## 2023-12-06 ENCOUNTER — Inpatient Hospital Stay (HOSPITAL_COMMUNITY)

## 2023-12-06 DIAGNOSIS — K56609 Unspecified intestinal obstruction, unspecified as to partial versus complete obstruction: Secondary | ICD-10-CM | POA: Diagnosis not present

## 2023-12-06 DIAGNOSIS — N7093 Salpingitis and oophoritis, unspecified: Secondary | ICD-10-CM | POA: Diagnosis not present

## 2023-12-06 DIAGNOSIS — I10 Essential (primary) hypertension: Secondary | ICD-10-CM | POA: Diagnosis not present

## 2023-12-06 LAB — COMPREHENSIVE METABOLIC PANEL WITH GFR
ALT: 12 U/L (ref 0–44)
AST: 12 U/L — ABNORMAL LOW (ref 15–41)
Albumin: 2.9 g/dL — ABNORMAL LOW (ref 3.5–5.0)
Alkaline Phosphatase: 68 U/L (ref 38–126)
Anion gap: 10 (ref 5–15)
BUN: 8 mg/dL (ref 6–20)
CO2: 26 mmol/L (ref 22–32)
Calcium: 8.9 mg/dL (ref 8.9–10.3)
Chloride: 103 mmol/L (ref 98–111)
Creatinine, Ser: 0.68 mg/dL (ref 0.44–1.00)
GFR, Estimated: >60 mL/min (ref >60)
Glucose, Bld: 120 mg/dL — ABNORMAL HIGH (ref 70–99)
Potassium: 3.4 mmol/L — ABNORMAL LOW (ref 3.5–5.1)
Sodium: 139 mmol/L (ref 135–145)
Total Bilirubin: 0.2 mg/dL (ref 0.0–1.2)
Total Protein: 7 g/dL (ref 6.5–8.1)

## 2023-12-06 LAB — BASIC METABOLIC PANEL WITH GFR
Anion gap: 11 (ref 5–15)
BUN: 9 mg/dL (ref 6–20)
CO2: 29 mmol/L (ref 22–32)
Calcium: 9.2 mg/dL (ref 8.9–10.3)
Chloride: 102 mmol/L (ref 98–111)
Creatinine, Ser: 0.78 mg/dL (ref 0.44–1.00)
GFR, Estimated: >60 mL/min (ref >60)
Glucose, Bld: 98 mg/dL (ref 70–99)
Potassium: 3.4 mmol/L — ABNORMAL LOW (ref 3.5–5.1)
Sodium: 142 mmol/L (ref 135–145)

## 2023-12-06 LAB — CBC
HCT: 36.9 % (ref 36.0–46.0)
Hemoglobin: 11 g/dL — ABNORMAL LOW (ref 12.0–15.0)
MCH: 22.4 pg — ABNORMAL LOW (ref 26.0–34.0)
MCHC: 29.8 g/dL — ABNORMAL LOW (ref 30.0–36.0)
MCV: 75 fL — ABNORMAL LOW (ref 80.0–100.0)
Platelets: 672 K/uL — ABNORMAL HIGH (ref 150–400)
RBC: 4.92 MIL/uL (ref 3.87–5.11)
RDW: 19.7 % — ABNORMAL HIGH (ref 11.5–15.5)
WBC: 11.5 K/uL — ABNORMAL HIGH (ref 4.0–10.5)
nRBC: 0 % (ref 0.0–0.2)

## 2023-12-06 MED ORDER — POTASSIUM CHLORIDE CRYS ER 20 MEQ PO TBCR: 40.0000 meq | EXTENDED_RELEASE_TABLET | Freq: Once | ORAL | Status: DC

## 2023-12-06 MED ORDER — POTASSIUM CHLORIDE 20 MEQ PO PACK
40.0000 meq | PACK | Freq: Once | ORAL | Status: AC
  Administered 2023-12-06: 40 meq
  Filled 2023-12-06: qty 2

## 2023-12-06 MED ORDER — HYDRALAZINE HCL 20 MG/ML IJ SOLN: 5.0000 mg | Freq: Four times a day (QID) | INTRAMUSCULAR | Status: DC | PRN

## 2023-12-06 MED ORDER — DIATRIZOATE MEGLUMINE & SODIUM 66-10 % PO SOLN
90.0000 mL | Freq: Once | ORAL | Status: AC
  Administered 2023-12-06: 90 mL via NASOGASTRIC
  Filled 2023-12-06: qty 90

## 2023-12-06 NOTE — Progress Notes (Signed)
 [facility]                                                                               Victoria Chavez, is a 43 y.o. female, DOB - 1981/01/17, FMW:[id number] Admit date - 12/05/2023    Outpatient Primary MD for the patient is Ilah Crigler, MD  LOS - 1  days    Brief summary    Victoria Chavez is a 43 y.o. female with medical history significant of hypertension, morbid obesity, sleep apnea, iron  deficiency anemia, ventral hernia s/p mesh hernia repair in 2013 with recurrent incisional hernia repair in 2021 was  recently discharged  after being treated for left tubo ovarian abscess on 11/26/3023 presents back to ED for nausea, vomiting and abdominal pain.  CT abd and pelvis shows high grade SBO with a transition point within the large inferior ventral hernia. General surgery consulted.   Assessment & Plan    Assessment and Plan:   High grade SBO Associated with nausea, vomiting and abdominal pain.  Gen surgery on board.  Conservative management as per general surgery.  Continue with NPO, IV fluids, pain control and anti emetics.  NG tube placed and connected to intermittent suction.  Mild leukocytosis.      Thrombocytosis Reactive.      Hypertension:     Tubo ovarian abscess: Patient was on Augmentin  at home,but was unable to keep it down due to nausea and vomiting for 3 days.  - start patient IV antibiotics.   Body mass index is 49.03 kg/m. Morbid obesity    Hypokalemia Replaced.     Estimated body mass index is 49.03 kg/m as calculated from the following:   Height as of this encounter: 5' 7 (1.702 m).   Weight as of this encounter: 142 kg.  Code Status: full code.  DVT Prophylaxis:     Level of Care: Level of care: Med-Surg Family Communication: none at bedside.   Disposition Plan:     Remains inpatient appropriate:  pending.   Procedures:   None.  Consultants:   General surgery.   Antimicrobials:   Anti-infectives (From  admission, onward)    Start     Dose/Rate Route Frequency Ordered Stop   12/05/23 1700  piperacillin -tazobactam (ZOSYN ) IVPB 3.375 g        3.375 g 12.5 mL/hr over 240 Minutes Intravenous Every 8 hours 12/05/23 1609          Medications  Scheduled Meds:  enoxaparin  (LOVENOX ) injection  70 mg Subcutaneous Q24H   spironolactone   25 mg Oral Daily   Continuous Infusions:  piperacillin -tazobactam (ZOSYN )  IV 3.375 g (12/06/23 1017)   PRN Meds:.acetaminophen , albuterol , ketorolac , ondansetron  **OR** ondansetron  (ZOFRAN ) IV    Subjective:   Victoria Chavez was seen and examined today.  Still nauseated and abdominal pain.   Objective:   Vitals:   12/05/23 2329 12/06/23 0424 12/06/23 0830 12/06/23 1214  BP: (!) 152/86 (!) 157/93 (!) 149/84 (!) 141/89  Pulse: 82 87 83 81  Resp: 20 20 18 18   Temp: 98 F (36.7 C) 98.3 F (36.8 C) 99.3 F (37.4 C) 98.2 F (36.8 C)  TempSrc: Oral Oral Oral Oral  SpO2: 95% 99% 91% 94%  Weight:      Height:        Intake/Output Summary (Last 24 hours) at 12/06/2023 1640 Last data filed at 12/06/2023 9287 Gross per 24 hour  Intake 2.11 ml  Output 100 ml  Net -97.89 ml   Filed Weights   12/05/23 0959 12/05/23 1637  Weight: (!) 142 kg (!) 142 kg     Exam General exam: Appears calm and comfortable  Respiratory system: Clear to auscultation. Respiratory effort normal. Cardiovascular system: S1 & S2 heard, RRR. No JVD,  Gastrointestinal system: Abdomen is  soft distended, tenderness+ Central nervous system: Alert and oriented. No focal neurological deficits. Extremities: Symmetric 5 x 5 power. Skin: No rashes,  Psychiatry: Mood & affect appropriate.     Data Reviewed:  I have personally reviewed following labs and imaging studies   CBC Lab Results  Component Value Date   WBC 11.5 (H) 12/06/2023   RBC 4.92 12/06/2023   HGB 11.0 (L) 12/06/2023   HCT 36.9 12/06/2023   MCV 75.0 (L) 12/06/2023   MCH 22.4 (L) 12/06/2023   PLT 672 (H)  12/06/2023   MCHC 29.8 (L) 12/06/2023   RDW 19.7 (H) 12/06/2023   LYMPHSABS 1.1 12/05/2023   MONOABS 0.8 12/05/2023   EOSABS 0.0 12/05/2023   BASOSABS 0.0 12/05/2023     Last metabolic panel Lab Results  Component Value Date   NA 139 12/06/2023   K 3.4 (L) 12/06/2023   CL 103 12/06/2023   CO2 26 12/06/2023   BUN 8 12/06/2023   CREATININE 0.68 12/06/2023   GLUCOSE 120 (H) 12/06/2023   GFRNONAA >60 12/06/2023   GFRAA >60 10/28/2019   CALCIUM 8.9 12/06/2023   PROT 7.0 12/06/2023   ALBUMIN  2.9 (L) 12/06/2023   BILITOT 0.2 12/06/2023   ALKPHOS 68 12/06/2023   AST 12 (L) 12/06/2023   ALT 12 12/06/2023   ANIONGAP 10 12/06/2023    CBG (last 3)  No results for input(s): GLUCAP in the last 72 hours.    Coagulation Profile: No results for input(s): INR, PROTIME in the last 168 hours.   Radiology Studies: DG Abd 1 View Result Date: 12/05/2023 CLINICAL DATA:  Nasogastric tube EXAM: ABDOMEN - 1 VIEW COMPARISON:  Abdominal x-ray 11/23/2023 FINDINGS: Nasogastric tube tip is in the body of the stomach. IMPRESSION: Nasogastric tube tip is in the body of the stomach. Electronically Signed   By: Greig Pique M.D.   On: 12/05/2023 18:27   CT ABDOMEN PELVIS W CONTRAST Result Date: 12/05/2023 CLINICAL DATA:  Hernia suspected, abdominal wall. Complaining of abdominal pain. EXAM: CT ABDOMEN AND PELVIS WITH CONTRAST TECHNIQUE: Multidetector CT imaging of the abdomen and pelvis was performed using the standard protocol following bolus administration of intravenous contrast. RADIATION DOSE REDUCTION: This exam was performed according to the departmental dose-optimization program which includes automated exposure control, adjustment of the mA and/or kV according to patient size and/or use of iterative reconstruction technique. CONTRAST:  75mL OMNIPAQUE  IOHEXOL  350 MG/ML SOLN COMPARISON:  CT abdomen pelvis 11/22/2023 FINDINGS: Lower chest: Mild volume loss at the left lung base. Otherwise, the  visualized lung bases are clear. Hepatobiliary: Multiple gallstones. No evidence for gallbladder inflammation. Normal appearance of the liver. No biliary dilatation. Pancreas: Unremarkable. No pancreatic ductal dilatation or surrounding inflammatory changes. Spleen: Normal in size without focal abnormality. Adrenals/Urinary Tract: Adrenal glands are within normal limits. Normal appearance of the right kidney without stones or hydronephrosis. Calcifications in the posterior left kidney interpolar  region. These may represent renal calculi. No hydronephrosis. No suspicious renal lesions. No acute abnormality to the urinary bladder. Stomach/Bowel: 2 large ventral hernias containing loops of bowel. Transverse colon is within the more superior ventral hernia. The lower right paracentral ventral hernia contains the descending colon and distal small bowel. There are new dilated loops of small bowel within the hernia sac with a transition point. In addition, there are dilated loops of small bowel within the intra-abdominal cavity. Small bowel dilatation is new. Small bowel loops measure up to 4.6 cm on image 51/3. Findings are suggestive for a high-grade small bowel obstruction. Normal appearance of the stomach without gastric distension. Mesenteric edema throughout the abdomen. There is probably a small amount of fluid within the lower large ventral hernia. Vascular/Lymphatic: Atherosclerotic disease involving the iliac arteries. Negative for an abdominal aortic aneurysm. Mildly prominent periaortic and retroperitoneal lymph nodes are similar to the comparison examination. Overall, there is no significant lymphadenopathy in the abdomen or pelvis. Reproductive: Again noted are low-density collections posterior to the uterus. These fluid collections are complex. The largest component on image 69/3 measures approximately 4.9 x 2.8 cm and measured 8.6 x 6.0 cm on 11/22/2023. Additional complex collections near the left adnexa.  Based on previous imaging, this may represent decreased size of tubo-ovarian abscess. Overall, these collections appear to be smaller. Other: There appears to be a small amount of fluid in the dependent aspect of the large ventral hernia associated with the small bowel obstruction. Presacral edema. No significant fluid in the pelvis. No evidence for extraluminal air. Musculoskeletal: No acute bone abnormality. IMPRESSION: 1. High-grade small bowel obstruction with a transition point within the large inferior ventral hernia. Small amount of free fluid within the large ventral hernia sac. Mild mesenteric edema. 2. Two large ventral hernias. The superior ventral hernia is near the midline and contains a portion of the transverse colon. The larger inferior right paracentral ventral hernia contains both small and large bowel and there is evidence for a small bowel obstruction and transition point within the large ventral hernia sac. 3. Complex pelvic fluid collections have decreased in size since 11/22/2023. Findings may represent a resolving or improving tubo-ovarian abscess. 4. Cholelithiasis. 5. Indeterminate left renal calcifications.  No hydronephrosis. These results were called by telephone at the time of interpretation on 12/05/2023 at 1:15 pm to provider Dr. Francesca, Who verbally acknowledged these results. Electronically Signed   By: Juliene Balder M.D.   On: 12/05/2023 13:26       Elgie Butter M.D. [facility] 12/06/2023, 4:40 PM  Available via Epic secure chat 7am-7pm After 7 pm, please refer to night coverage provider listed on amion.

## 2023-12-06 NOTE — Plan of Care (Signed)
  Problem: Pain Managment: Goal: General experience of comfort will improve and/or be controlled Outcome: Progressing   Problem: Safety: Goal: Ability to remain free from injury will improve Outcome: Progressing   Problem: Coping: Goal: Level of anxiety will decrease Outcome: Progressing

## 2023-12-06 NOTE — Progress Notes (Signed)
 Progress Note     Subjective: Patient with NGT to suction. Denies nausea this AM. Denies BM or flatus. Stated that her abdominal pain is better today.   Objective: Vital signs in last 24 hours: Temp:  [98 F (36.7 C)-99.3 F (37.4 C)] 99.3 F (37.4 C) (08/09 0830) Pulse Rate:  [79-88] 83 (08/09 0830) Resp:  [16-20] 18 (08/09 0830) BP: (149-174)/(84-109) 149/84 (08/09 0830) SpO2:  [91 %-99 %] 91 % (08/09 0830) Weight:  [142 kg] 142 kg (08/08 1637) Last BM Date : 12/03/23  Intake/Output from previous day: 08/08 0701 - 08/09 0700 In: 2.1 [I.V.:1.8; IV Piggyback:0.4] Out: -  Intake/Output this shift: Total I/O In: -  Out: 100 [Emesis/NG output:100]  PE: General: pleasant, WD, morbidly obese female who is laying in bed in NAD HEENT: head is normocephalic, atraumatic.  Sclera are noninjected.  Pupils equal and round.  Ears and nose without any masses or lesions.  Mouth is pink and moist Heart: regular, rate, and rhythm. Palpable radial and pedal pulses bilaterally Lungs: CTAB, no wheezes, rhonchi, or rales noted.  Respiratory effort nonlabored Abd: soft with large ventral hernia x2 that is nonreducible with some TTP. No overlying skin changes or cellulitis. Her generalized abdominal pain has improved but still mostly tender in the umbilical region deep to the ventral hernia.  MS: all 4 extremities are symmetrical with no cyanosis, clubbing, or edema. Skin: warm and dry with no masses, lesions, or rashes Neuro: Cranial nerves 2-12 grossly intact, sensation is normal throughout Psych: A&Ox3 with an appropriate affect.    Lab Results:  Recent Labs    12/05/23 1014 12/06/23 0240  WBC 11.3* 11.5*  HGB 12.2 11.0*  HCT 41.0 36.9  PLT 750* 672*   BMET Recent Labs    12/05/23 1014 12/06/23 0240  NA 138 139  K 3.6 3.4*  CL 98 103  CO2 25 26  GLUCOSE 139* 120*  BUN 9 8  CREATININE 0.71 0.68  CALCIUM 9.6 8.9   PT/INR No results for input(s): LABPROT, INR in the  last 72 hours. CMP     Component Value Date/Time   NA 139 12/06/2023 0240   K 3.4 (L) 12/06/2023 0240   CL 103 12/06/2023 0240   CO2 26 12/06/2023 0240   GLUCOSE 120 (H) 12/06/2023 0240   BUN 8 12/06/2023 0240   CREATININE 0.68 12/06/2023 0240   CALCIUM 8.9 12/06/2023 0240   PROT 7.0 12/06/2023 0240   ALBUMIN  2.9 (L) 12/06/2023 0240   AST 12 (L) 12/06/2023 0240   ALT 12 12/06/2023 0240   ALKPHOS 68 12/06/2023 0240   BILITOT 0.2 12/06/2023 0240   GFRNONAA >60 12/06/2023 0240   GFRAA >60 10/28/2019 0422   Lipase     Component Value Date/Time   LIPASE 25 12/05/2023 1014       Studies/Results: DG Abd 1 View Result Date: 12/05/2023 CLINICAL DATA:  Nasogastric tube EXAM: ABDOMEN - 1 VIEW COMPARISON:  Abdominal x-ray 11/23/2023 FINDINGS: Nasogastric tube tip is in the body of the stomach. IMPRESSION: Nasogastric tube tip is in the body of the stomach. Electronically Signed   By: Greig Pique M.D.   On: 12/05/2023 18:27   CT ABDOMEN PELVIS W CONTRAST Result Date: 12/05/2023 CLINICAL DATA:  Hernia suspected, abdominal wall. Complaining of abdominal pain. EXAM: CT ABDOMEN AND PELVIS WITH CONTRAST TECHNIQUE: Multidetector CT imaging of the abdomen and pelvis was performed using the standard protocol following bolus administration of intravenous contrast. RADIATION DOSE REDUCTION: This exam  was performed according to the departmental dose-optimization program which includes automated exposure control, adjustment of the mA and/or kV according to patient size and/or use of iterative reconstruction technique. CONTRAST:  75mL OMNIPAQUE  IOHEXOL  350 MG/ML SOLN COMPARISON:  CT abdomen pelvis 11/22/2023 FINDINGS: Lower chest: Mild volume loss at the left lung base. Otherwise, the visualized lung bases are clear. Hepatobiliary: Multiple gallstones. No evidence for gallbladder inflammation. Normal appearance of the liver. No biliary dilatation. Pancreas: Unremarkable. No pancreatic ductal dilatation or  surrounding inflammatory changes. Spleen: Normal in size without focal abnormality. Adrenals/Urinary Tract: Adrenal glands are within normal limits. Normal appearance of the right kidney without stones or hydronephrosis. Calcifications in the posterior left kidney interpolar region. These may represent renal calculi. No hydronephrosis. No suspicious renal lesions. No acute abnormality to the urinary bladder. Stomach/Bowel: 2 large ventral hernias containing loops of bowel. Transverse colon is within the more superior ventral hernia. The lower right paracentral ventral hernia contains the descending colon and distal small bowel. There are new dilated loops of small bowel within the hernia sac with a transition point. In addition, there are dilated loops of small bowel within the intra-abdominal cavity. Small bowel dilatation is new. Small bowel loops measure up to 4.6 cm on image 51/3. Findings are suggestive for a high-grade small bowel obstruction. Normal appearance of the stomach without gastric distension. Mesenteric edema throughout the abdomen. There is probably a small amount of fluid within the lower large ventral hernia. Vascular/Lymphatic: Atherosclerotic disease involving the iliac arteries. Negative for an abdominal aortic aneurysm. Mildly prominent periaortic and retroperitoneal lymph nodes are similar to the comparison examination. Overall, there is no significant lymphadenopathy in the abdomen or pelvis. Reproductive: Again noted are low-density collections posterior to the uterus. These fluid collections are complex. The largest component on image 69/3 measures approximately 4.9 x 2.8 cm and measured 8.6 x 6.0 cm on 11/22/2023. Additional complex collections near the left adnexa. Based on previous imaging, this may represent decreased size of tubo-ovarian abscess. Overall, these collections appear to be smaller. Other: There appears to be a small amount of fluid in the dependent aspect of the large  ventral hernia associated with the small bowel obstruction. Presacral edema. No significant fluid in the pelvis. No evidence for extraluminal air. Musculoskeletal: No acute bone abnormality. IMPRESSION: 1. High-grade small bowel obstruction with a transition point within the large inferior ventral hernia. Small amount of free fluid within the large ventral hernia sac. Mild mesenteric edema. 2. Two large ventral hernias. The superior ventral hernia is near the midline and contains a portion of the transverse colon. The larger inferior right paracentral ventral hernia contains both small and large bowel and there is evidence for a small bowel obstruction and transition point within the large ventral hernia sac. 3. Complex pelvic fluid collections have decreased in size since 11/22/2023. Findings may represent a resolving or improving tubo-ovarian abscess. 4. Cholelithiasis. 5. Indeterminate left renal calcifications.  No hydronephrosis. These results were called by telephone at the time of interpretation on 12/05/2023 at 1:15 pm to provider Dr. Francesca, Who verbally acknowledged these results. Electronically Signed   By: Juliene Balder M.D.   On: 12/05/2023 13:26    Anti-infectives: Anti-infectives (From admission, onward)    Start     Dose/Rate Route Frequency Ordered Stop   12/05/23 1700  piperacillin -tazobactam (ZOSYN ) IVPB 3.375 g        3.375 g 12.5 mL/hr over 240 Minutes Intravenous Every 8 hours 12/05/23 1609  She reports hx - Lap UHR with mesh in [place] 2013 - Ex lap, recurrent incisional hernia repair and partial omentectomy by Dr. Rubin in 2021 for  incarcerated, recurrent incisional hernia, purulent ascites  - Recurred ~2 years ago.  - Saw Dr. Rubin in March 2025 for recurrence. They discussed weight loss options before surgery.  - Reports Hernia is always out and painful, usually she is able to massage it back flat but recurs immediately after. Currently pain is worse compared to normal.    Assessment/Plan Large ventral hernia x 2 - CT 8/8 is concerning for high-grade small bowel obstruction with a transition point within the large inferior ventral hernia. Small amount of free fluid within the large ventral hernia sac. Mild mesenteric edema. Her proximal small bowel however does seem a little more dilated.   - Still no indication for emergency surgery - Ideally we would avoid an operation inpatient given she also has an active infection in her abdomen (TOA) that she is being tx with abx for. If she required surgery inpatient it would likely be an open primary repair with high risk for recurrence given we would not be able to repair with mesh and her hx of morbid obesity.  -NGT tube for decompression then SBO protocol starting today (8/9).      FEN - NPO, NGT to LIWS once placed, IVF per admitting   VTE - SCDs, okay for chem ppx from a general surgery standpoint ID - Per admitting but will need to be IV with NPO status     TOA/PID - Per GYN.  - They recommended 14d of abx and repeat ultrasound in 6-8wks during last admission History of morbid obesity - BMI 49.03 Chronic iron  deficiency anemia/B12 deficiency - being followed by Dr. Lanny and being worked up outpatient for possible thalassemia, has been on PO iron  at home    LOS: 1 day   I reviewed nursing notes, ED provider notes, last 24 h vitals and pain scores, last 48 h intake and output, last 24 h labs and trends, and last 24 h imaging results.    Eulah Hammonds, [facility] 12/06/2023, 9:18 AM Please see Amion for pager number during day hours 7:00am-4:30pm

## 2023-12-07 ENCOUNTER — Inpatient Hospital Stay (HOSPITAL_COMMUNITY)

## 2023-12-07 DIAGNOSIS — N7093 Salpingitis and oophoritis, unspecified: Secondary | ICD-10-CM | POA: Diagnosis not present

## 2023-12-07 DIAGNOSIS — K56609 Unspecified intestinal obstruction, unspecified as to partial versus complete obstruction: Secondary | ICD-10-CM | POA: Diagnosis not present

## 2023-12-07 DIAGNOSIS — I10 Essential (primary) hypertension: Secondary | ICD-10-CM | POA: Diagnosis not present

## 2023-12-07 LAB — BASIC METABOLIC PANEL WITH GFR
Anion gap: 15 (ref 5–15)
BUN: 13 mg/dL (ref 6–20)
CO2: 27 mmol/L (ref 22–32)
Calcium: 9.3 mg/dL (ref 8.9–10.3)
Chloride: 98 mmol/L (ref 98–111)
Creatinine, Ser: 0.74 mg/dL (ref 0.44–1.00)
GFR, Estimated: >60 mL/min (ref >60)
Glucose, Bld: 89 mg/dL (ref 70–99)
Potassium: 3.3 mmol/L — ABNORMAL LOW (ref 3.5–5.1)
Sodium: 140 mmol/L (ref 135–145)

## 2023-12-07 LAB — MAGNESIUM: Magnesium: 2.1 mg/dL (ref 1.7–2.4)

## 2023-12-07 MED ORDER — POTASSIUM CHLORIDE 10 MEQ/100ML IV SOLN
10.0000 meq | INTRAVENOUS | Status: AC
  Filled 2023-12-07: qty 100

## 2023-12-07 MED ORDER — LACTATED RINGERS IV SOLN: INTRAVENOUS | Status: AC

## 2023-12-07 MED ORDER — POTASSIUM CHLORIDE 10 MEQ/100ML IV SOLN
10.0000 meq | INTRAVENOUS | Status: AC
  Administered 2023-12-07 (×2): 10 meq via INTRAVENOUS
  Filled 2023-12-07: qty 100

## 2023-12-07 MED ORDER — DIATRIZOATE MEGLUMINE & SODIUM 66-10 % PO SOLN
90.0000 mL | Freq: Once | ORAL | Status: AC
  Administered 2023-12-07: 90 mL via NASOGASTRIC
  Filled 2023-12-07: qty 90

## 2023-12-07 MED ORDER — HYDRALAZINE HCL 20 MG/ML IJ SOLN
5.0000 mg | Freq: Three times a day (TID) | INTRAMUSCULAR | Status: DC | PRN
  Administered 2023-12-07 (×2): 5 mg via INTRAVENOUS
  Filled 2023-12-07 (×2): qty 1

## 2023-12-07 MED ORDER — SODIUM CHLORIDE 0.9 % IV SOLN: 12.5000 mg | Freq: Four times a day (QID) | INTRAVENOUS | Status: DC | PRN

## 2023-12-07 NOTE — Progress Notes (Signed)
 [facility]                                                                               Victoria Chavez, is a 43 y.o. female, DOB - May 05, 1980, FMW:[id number] Admit date - 12/05/2023    Outpatient Primary MD for the patient is Victoria Crigler, MD  LOS - 2  days    Brief summary    Victoria Chavez is a 43 y.o. female with medical history significant of hypertension, morbid obesity, sleep apnea, iron  deficiency anemia, ventral hernia s/p mesh hernia repair in 2013 with recurrent incisional hernia repair in 2021 was  recently discharged  after being treated for left tubo ovarian abscess on 11/26/3023 presents back to ED for nausea, vomiting and abdominal pain.  CT abd and pelvis shows high grade SBO with a transition point within the large inferior ventral hernia. General surgery consulted.   Assessment & Plan    Assessment and Plan:   High grade SBO Associated with nausea, vomiting and abdominal pain.  Gen surgery on board.  Conservative management as per general surgery.  Continue with NPO, IV fluids, pain control and anti emetics.  NG tube placed and connected to intermittent suction.  Mild leukocytosis.  Overnight NG tube was out, NG tube to be replaced and connect to intermittent suction.      Thrombocytosis Reactive.      Hypertension:  - sub optimal.  Started the patient on IV hydralazine .    Tubo ovarian abscess: Patient was on Augmentin  at home,but was unable to keep it down due to nausea and vomiting for 3 days.  - started patient IV antibiotics.   Body mass index is 49.03 kg/m. Morbid obesity    Hypokalemia Replaced.     Estimated body mass index is 49.03 kg/m as calculated from the following:   Height as of this encounter: 5' 7 (1.702 m).   Weight as of this encounter: 142 kg.  Code Status: full code.  DVT Prophylaxis:     Level of Care: Level of care: Med-Surg Family Communication: none at bedside.   Disposition Plan:     Remains  inpatient appropriate:  pending.   Procedures:  None.   Consultants:   General surgery.   Antimicrobials:   Anti-infectives (From admission, onward)    Start     Dose/Rate Route Frequency Ordered Stop   12/05/23 1700  piperacillin -tazobactam (ZOSYN ) IVPB 3.375 g        3.375 g 12.5 mL/hr over 240 Minutes Intravenous Every 8 hours 12/05/23 1609          Medications  Scheduled Meds:  enoxaparin  (LOVENOX ) injection  70 mg Subcutaneous Q24H   Continuous Infusions:  lactated ringers  100 mL/hr at 12/07/23 0755   piperacillin -tazobactam (ZOSYN )  IV 3.375 g (12/07/23 0108)   potassium chloride      promethazine (PHENERGAN) injection (IM or IVPB)     PRN Meds:.acetaminophen , albuterol , hydrALAZINE , ketorolac , ondansetron  **OR** ondansetron  (ZOFRAN ) IV, promethazine (PHENERGAN) injection (IM or IVPB)    Subjective:   Victoria Chavez was seen and examined today.   Nauseated added phenergan.   Objective:   Vitals:   12/07/23 0013 12/07/23 0440 12/07/23  0545 12/07/23 0826  BP: (!) 160/81 (!) 167/95 (!) 161/95 (!) 163/104  Pulse: 81 81 84 85  Resp: 20 20 19 20   Temp: 98.4 F (36.9 C) 98.1 F (36.7 C) (!) 97.5 F (36.4 C) 98.4 F (36.9 C)  TempSrc: Oral     SpO2: 97% 99% 98% 95%  Weight:      Height:        Intake/Output Summary (Last 24 hours) at 12/07/2023 1008 Last data filed at 12/06/2023 1750 Gross per 24 hour  Intake --  Output 900 ml  Net -900 ml   Filed Weights   12/05/23 0959 12/05/23 1637  Weight: (!) 142 kg (!) 142 kg     Exam General exam: Appears calm and comfortable  Respiratory system: Clear to auscultation. Respiratory effort normal. Cardiovascular system: S1 & S2 heard, RRR. No JVD,  Gastrointestinal system: Abdomen is  soft distended, tenderness+ Central nervous system: Alert and oriented. No focal neurological deficits. Extremities: Symmetric 5 x 5 power. Skin: No rashes,  Psychiatry: Mood & affect appropriate.     Data Reviewed:  I have  personally reviewed following labs and imaging studies   CBC Lab Results  Component Value Date   WBC 11.5 (H) 12/06/2023   RBC 4.92 12/06/2023   HGB 11.0 (L) 12/06/2023   HCT 36.9 12/06/2023   MCV 75.0 (L) 12/06/2023   MCH 22.4 (L) 12/06/2023   PLT 672 (H) 12/06/2023   MCHC 29.8 (L) 12/06/2023   RDW 19.7 (H) 12/06/2023   LYMPHSABS 1.1 12/05/2023   MONOABS 0.8 12/05/2023   EOSABS 0.0 12/05/2023   BASOSABS 0.0 12/05/2023     Last metabolic panel Lab Results  Component Value Date   NA 142 12/06/2023   K 3.4 (L) 12/06/2023   CL 102 12/06/2023   CO2 29 12/06/2023   BUN 9 12/06/2023   CREATININE 0.78 12/06/2023   GLUCOSE 98 12/06/2023   GFRNONAA >60 12/06/2023   GFRAA >60 10/28/2019   CALCIUM 9.2 12/06/2023   PROT 7.0 12/06/2023   ALBUMIN  2.9 (L) 12/06/2023   BILITOT 0.2 12/06/2023   ALKPHOS 68 12/06/2023   AST 12 (L) 12/06/2023   ALT 12 12/06/2023   ANIONGAP 11 12/06/2023    CBG (last 3)  No results for input(s): GLUCAP in the last 72 hours.    Coagulation Profile: No results for input(s): INR, PROTIME in the last 168 hours.   Radiology Studies: DG Abd Portable 1V Result Date: 12/06/2023 CLINICAL DATA:  Nasojejunal tube. EXAM: PORTABLE ABDOMEN - 1 VIEW COMPARISON:  None Available. FINDINGS: Nasal jejunal tube is not visualized in the neck, chest or abdomen. No dilated bowel loops are seen. Lungs are is clear. IMPRESSION: Nasal jejunal tube is not visualized in the neck, chest or abdomen. Please correlate clinically. Electronically Signed   By: Greig Pique M.D.   On: 12/06/2023 22:51   DG Chest Port 1 View Result Date: 12/06/2023 CLINICAL DATA:  Nasogastric tube placement EXAM: PORTABLE CHEST 1 VIEW COMPARISON:  None Available. FINDINGS: The heart size and mediastinal contours are within normal limits. Both lungs are clear. The visualized skeletal structures are unremarkable. No nasogastric tube is identified within the visualized thorax. IMPRESSION: 1. No  active disease. No nasogastric tube identified within the visualized thorax. Electronically Signed   By: Dorethia Molt M.D.   On: 12/06/2023 22:29   DG Abd Portable 1V-Small Bowel Obstruction Protocol-initial, 8 hr delay Result Date: 12/06/2023 CLINICAL DATA:  8 hour delay for small bowel obstruction  protocol. EXAM: PORTABLE ABDOMEN - 1 VIEW COMPARISON:  Abdominal x-ray 12/05/2023. CT abdomen and pelvis 12/05/2023. FINDINGS: There is minimal oral contrast seen within the stomach. No other significant oral contrast identified throughout the abdomen. There are dilated air-filled loops of small bowel in the left upper quadrant measuring 4.6 cm similar to prior. No suspicious calcifications. No acute osseous abnormality. IMPRESSION: 1. Minimal oral contrast seen within the stomach. No other significant oral contrast identified throughout the abdomen. 2. Persistent dilated air-filled loops of small bowel in the left upper quadrant. Findings are concerning for persistent small bowel obstruction. Electronically Signed   By: Greig Pique M.D.   On: 12/06/2023 22:27   DG Abd 1 View Result Date: 12/05/2023 CLINICAL DATA:  Nasogastric tube EXAM: ABDOMEN - 1 VIEW COMPARISON:  Abdominal x-ray 11/23/2023 FINDINGS: Nasogastric tube tip is in the body of the stomach. IMPRESSION: Nasogastric tube tip is in the body of the stomach. Electronically Signed   By: Greig Pique M.D.   On: 12/05/2023 18:27   CT ABDOMEN PELVIS W CONTRAST Result Date: 12/05/2023 CLINICAL DATA:  Hernia suspected, abdominal wall. Complaining of abdominal pain. EXAM: CT ABDOMEN AND PELVIS WITH CONTRAST TECHNIQUE: Multidetector CT imaging of the abdomen and pelvis was performed using the standard protocol following bolus administration of intravenous contrast. RADIATION DOSE REDUCTION: This exam was performed according to the departmental dose-optimization program which includes automated exposure control, adjustment of the mA and/or kV according to  patient size and/or use of iterative reconstruction technique. CONTRAST:  75mL OMNIPAQUE  IOHEXOL  350 MG/ML SOLN COMPARISON:  CT abdomen pelvis 11/22/2023 FINDINGS: Lower chest: Mild volume loss at the left lung base. Otherwise, the visualized lung bases are clear. Hepatobiliary: Multiple gallstones. No evidence for gallbladder inflammation. Normal appearance of the liver. No biliary dilatation. Pancreas: Unremarkable. No pancreatic ductal dilatation or surrounding inflammatory changes. Spleen: Normal in size without focal abnormality. Adrenals/Urinary Tract: Adrenal glands are within normal limits. Normal appearance of the right kidney without stones or hydronephrosis. Calcifications in the posterior left kidney interpolar region. These may represent renal calculi. No hydronephrosis. No suspicious renal lesions. No acute abnormality to the urinary bladder. Stomach/Bowel: 2 large ventral hernias containing loops of bowel. Transverse colon is within the more superior ventral hernia. The lower right paracentral ventral hernia contains the descending colon and distal small bowel. There are new dilated loops of small bowel within the hernia sac with a transition point. In addition, there are dilated loops of small bowel within the intra-abdominal cavity. Small bowel dilatation is new. Small bowel loops measure up to 4.6 cm on image 51/3. Findings are suggestive for a high-grade small bowel obstruction. Normal appearance of the stomach without gastric distension. Mesenteric edema throughout the abdomen. There is probably a small amount of fluid within the lower large ventral hernia. Vascular/Lymphatic: Atherosclerotic disease involving the iliac arteries. Negative for an abdominal aortic aneurysm. Mildly prominent periaortic and retroperitoneal lymph nodes are similar to the comparison examination. Overall, there is no significant lymphadenopathy in the abdomen or pelvis. Reproductive: Again noted are low-density  collections posterior to the uterus. These fluid collections are complex. The largest component on image 69/3 measures approximately 4.9 x 2.8 cm and measured 8.6 x 6.0 cm on 11/22/2023. Additional complex collections near the left adnexa. Based on previous imaging, this may represent decreased size of tubo-ovarian abscess. Overall, these collections appear to be smaller. Other: There appears to be a small amount of fluid in the dependent aspect of the large ventral hernia associated  with the small bowel obstruction. Presacral edema. No significant fluid in the pelvis. No evidence for extraluminal air. Musculoskeletal: No acute bone abnormality. IMPRESSION: 1. High-grade small bowel obstruction with a transition point within the large inferior ventral hernia. Small amount of free fluid within the large ventral hernia sac. Mild mesenteric edema. 2. Two large ventral hernias. The superior ventral hernia is near the midline and contains a portion of the transverse colon. The larger inferior right paracentral ventral hernia contains both small and large bowel and there is evidence for a small bowel obstruction and transition point within the large ventral hernia sac. 3. Complex pelvic fluid collections have decreased in size since 11/22/2023. Findings may represent a resolving or improving tubo-ovarian abscess. 4. Cholelithiasis. 5. Indeterminate left renal calcifications.  No hydronephrosis. These results were called by telephone at the time of interpretation on 12/05/2023 at 1:15 pm to provider Dr. Francesca, Who verbally acknowledged these results. Electronically Signed   By: Juliene Balder M.D.   On: 12/05/2023 13:26       Elgie Butter M.D. [facility] 12/07/2023, 10:08 AM  Available via Epic secure chat 7am-7pm After 7 pm, please refer to night coverage provider listed on amion.

## 2023-12-07 NOTE — Progress Notes (Signed)
 Progress Note     Subjective: NGT removed this AM d/t issues with flushing and postioning. Endorses nausea and emesis this AM and was given Zofran  which supposedly relieved symptoms. Denies BM. Her abdominal pain is about the same as yesterday.   Objective: Vital signs in last 24 hours: Temp:  [97.5 F (36.4 C)-98.4 F (36.9 C)] 98.4 F (36.9 C) (08/10 0826) Pulse Rate:  [80-85] 85 (08/10 0826) Resp:  [16-20] 20 (08/10 0826) BP: (141-176)/(81-104) 163/104 (08/10 0826) SpO2:  [91 %-99 %] 95 % (08/10 0826) Last BM Date : 12/03/23  Intake/Output from previous day: 08/09 0701 - 08/10 0700 In: -  Out: 1000 [Emesis/NG output:1000] Intake/Output this shift: No intake/output data recorded.  PE: General: pleasant, WD, morbidly obese female who is laying in bed in NAD HEENT: head is normocephalic, atraumatic.  Sclera are noninjected.  Pupils equal and round.  Ears and nose without any masses or lesions.  Mouth is pink and moist Heart: regular, rate, and rhythm. Palpable radial and pedal pulses bilaterally Lungs: CTAB, no wheezes, rhonchi, or rales noted.  Respiratory effort nonlabored Abd: soft with large ventral hernia x2 that is nonreducible with some TTP. No overlying skin changes or cellulitis. Her generalized abdominal pain remains mostly in the umbilical region deep to the ventral hernia.  MS: all 4 extremities are symmetrical with no cyanosis, clubbing, or edema. Skin: warm and dry with no masses, lesions, or rashes Neuro: Cranial nerves 2-12 grossly intact, sensation is normal throughout Psych: A&Ox3 with an appropriate affect.    Lab Results:  Recent Labs    12/05/23 1014 12/06/23 0240  WBC 11.3* 11.5*  HGB 12.2 11.0*  HCT 41.0 36.9  PLT 750* 672*   BMET Recent Labs    12/06/23 0240 12/06/23 1737  NA 139 142  K 3.4* 3.4*  CL 103 102  CO2 26 29  GLUCOSE 120* 98  BUN 8 9  CREATININE 0.68 0.78  CALCIUM 8.9 9.2   PT/INR No results for input(s): LABPROT,  INR in the last 72 hours. CMP     Component Value Date/Time   NA 142 12/06/2023 1737   K 3.4 (L) 12/06/2023 1737   CL 102 12/06/2023 1737   CO2 29 12/06/2023 1737   GLUCOSE 98 12/06/2023 1737   BUN 9 12/06/2023 1737   CREATININE 0.78 12/06/2023 1737   CALCIUM 9.2 12/06/2023 1737   PROT 7.0 12/06/2023 0240   ALBUMIN  2.9 (L) 12/06/2023 0240   AST 12 (L) 12/06/2023 0240   ALT 12 12/06/2023 0240   ALKPHOS 68 12/06/2023 0240   BILITOT 0.2 12/06/2023 0240   GFRNONAA >60 12/06/2023 1737   GFRAA >60 10/28/2019 0422   Lipase     Component Value Date/Time   LIPASE 25 12/05/2023 1014       Studies/Results: DG Abd Portable 1V Result Date: 12/06/2023 CLINICAL DATA:  Nasojejunal tube. EXAM: PORTABLE ABDOMEN - 1 VIEW COMPARISON:  None Available. FINDINGS: Nasal jejunal tube is not visualized in the neck, chest or abdomen. No dilated bowel loops are seen. Lungs are is clear. IMPRESSION: Nasal jejunal tube is not visualized in the neck, chest or abdomen. Please correlate clinically. Electronically Signed   By: Greig Pique M.D.   On: 12/06/2023 22:51   DG Chest Port 1 View Result Date: 12/06/2023 CLINICAL DATA:  Nasogastric tube placement EXAM: PORTABLE CHEST 1 VIEW COMPARISON:  None Available. FINDINGS: The heart size and mediastinal contours are within normal limits. Both lungs are clear. The visualized  skeletal structures are unremarkable. No nasogastric tube is identified within the visualized thorax. IMPRESSION: 1. No active disease. No nasogastric tube identified within the visualized thorax. Electronically Signed   By: Dorethia Molt M.D.   On: 12/06/2023 22:29   DG Abd Portable 1V-Small Bowel Obstruction Protocol-initial, 8 hr delay Result Date: 12/06/2023 CLINICAL DATA:  8 hour delay for small bowel obstruction protocol. EXAM: PORTABLE ABDOMEN - 1 VIEW COMPARISON:  Abdominal x-ray 12/05/2023. CT abdomen and pelvis 12/05/2023. FINDINGS: There is minimal oral contrast seen within the  stomach. No other significant oral contrast identified throughout the abdomen. There are dilated air-filled loops of small bowel in the left upper quadrant measuring 4.6 cm similar to prior. No suspicious calcifications. No acute osseous abnormality. IMPRESSION: 1. Minimal oral contrast seen within the stomach. No other significant oral contrast identified throughout the abdomen. 2. Persistent dilated air-filled loops of small bowel in the left upper quadrant. Findings are concerning for persistent small bowel obstruction. Electronically Signed   By: Greig Pique M.D.   On: 12/06/2023 22:27   DG Abd 1 View Result Date: 12/05/2023 CLINICAL DATA:  Nasogastric tube EXAM: ABDOMEN - 1 VIEW COMPARISON:  Abdominal x-ray 11/23/2023 FINDINGS: Nasogastric tube tip is in the body of the stomach. IMPRESSION: Nasogastric tube tip is in the body of the stomach. Electronically Signed   By: Greig Pique M.D.   On: 12/05/2023 18:27   CT ABDOMEN PELVIS W CONTRAST Result Date: 12/05/2023 CLINICAL DATA:  Hernia suspected, abdominal wall. Complaining of abdominal pain. EXAM: CT ABDOMEN AND PELVIS WITH CONTRAST TECHNIQUE: Multidetector CT imaging of the abdomen and pelvis was performed using the standard protocol following bolus administration of intravenous contrast. RADIATION DOSE REDUCTION: This exam was performed according to the departmental dose-optimization program which includes automated exposure control, adjustment of the mA and/or kV according to patient size and/or use of iterative reconstruction technique. CONTRAST:  75mL OMNIPAQUE  IOHEXOL  350 MG/ML SOLN COMPARISON:  CT abdomen pelvis 11/22/2023 FINDINGS: Lower chest: Mild volume loss at the left lung base. Otherwise, the visualized lung bases are clear. Hepatobiliary: Multiple gallstones. No evidence for gallbladder inflammation. Normal appearance of the liver. No biliary dilatation. Pancreas: Unremarkable. No pancreatic ductal dilatation or surrounding inflammatory  changes. Spleen: Normal in size without focal abnormality. Adrenals/Urinary Tract: Adrenal glands are within normal limits. Normal appearance of the right kidney without stones or hydronephrosis. Calcifications in the posterior left kidney interpolar region. These may represent renal calculi. No hydronephrosis. No suspicious renal lesions. No acute abnormality to the urinary bladder. Stomach/Bowel: 2 large ventral hernias containing loops of bowel. Transverse colon is within the more superior ventral hernia. The lower right paracentral ventral hernia contains the descending colon and distal small bowel. There are new dilated loops of small bowel within the hernia sac with a transition point. In addition, there are dilated loops of small bowel within the intra-abdominal cavity. Small bowel dilatation is new. Small bowel loops measure up to 4.6 cm on image 51/3. Findings are suggestive for a high-grade small bowel obstruction. Normal appearance of the stomach without gastric distension. Mesenteric edema throughout the abdomen. There is probably a small amount of fluid within the lower large ventral hernia. Vascular/Lymphatic: Atherosclerotic disease involving the iliac arteries. Negative for an abdominal aortic aneurysm. Mildly prominent periaortic and retroperitoneal lymph nodes are similar to the comparison examination. Overall, there is no significant lymphadenopathy in the abdomen or pelvis. Reproductive: Again noted are low-density collections posterior to the uterus. These fluid collections are complex. The  largest component on image 69/3 measures approximately 4.9 x 2.8 cm and measured 8.6 x 6.0 cm on 11/22/2023. Additional complex collections near the left adnexa. Based on previous imaging, this may represent decreased size of tubo-ovarian abscess. Overall, these collections appear to be smaller. Other: There appears to be a small amount of fluid in the dependent aspect of the large ventral hernia associated  with the small bowel obstruction. Presacral edema. No significant fluid in the pelvis. No evidence for extraluminal air. Musculoskeletal: No acute bone abnormality. IMPRESSION: 1. High-grade small bowel obstruction with a transition point within the large inferior ventral hernia. Small amount of free fluid within the large ventral hernia sac. Mild mesenteric edema. 2. Two large ventral hernias. The superior ventral hernia is near the midline and contains a portion of the transverse colon. The larger inferior right paracentral ventral hernia contains both small and large bowel and there is evidence for a small bowel obstruction and transition point within the large ventral hernia sac. 3. Complex pelvic fluid collections have decreased in size since 11/22/2023. Findings may represent a resolving or improving tubo-ovarian abscess. 4. Cholelithiasis. 5. Indeterminate left renal calcifications.  No hydronephrosis. These results were called by telephone at the time of interpretation on 12/05/2023 at 1:15 pm to provider Dr. Francesca, Who verbally acknowledged these results. Electronically Signed   By: Juliene Balder M.D.   On: 12/05/2023 13:26    Anti-infectives: Anti-infectives (From admission, onward)    Start     Dose/Rate Route Frequency Ordered Stop   12/05/23 1700  piperacillin -tazobactam (ZOSYN ) IVPB 3.375 g        3.375 g 12.5 mL/hr over 240 Minutes Intravenous Every 8 hours 12/05/23 1609        She reports hx - Lap UHR with mesh in [place] 2013 - Ex lap, recurrent incisional hernia repair and partial omentectomy by Dr. Rubin in 2021 for  incarcerated, recurrent incisional hernia, purulent ascites  - Recurred ~2 years ago.  - Saw Dr. Rubin in March 2025 for recurrence. They discussed weight loss options before surgery.  - Reports Hernia is always out and painful, usually she is able to massage it back flat but recurs immediately after. Currently pain is worse compared to normal.    Assessment/Plan Large ventral hernia x 2 - CT 8/8 is concerning for high-grade small bowel obstruction with a transition point within the large inferior ventral hernia. Small amount of free fluid within the large ventral hernia sac. Mild mesenteric edema. Her proximal small bowel however does seem a little more dilated.   - Still no indication for emergency surgery.  - Ideally we would avoid an operation inpatient given she also has an active infection in her abdomen (TOA) that she is being tx with abx for. If she required surgery inpatient it would likely be an open primary repair with high risk for recurrence given we would not be able to repair with mesh and her hx of morbid obesity.  - Continue NGT tube for decompression. - SBO protocol (8/9) - Plain film showed minimal oral contrast within the stomach. No other significant oral contrast identified throughout the abdomen. Persistent dilated air-filled loops of small bowel in the left upper quadrant. Findings are concerning for persistent small bowel obstruction.   FEN - NPO, Replace NGT to LIWS once placed, IVF per admitting   VTE - SCDs, okay for chem ppx from a general surgery standpoint ID - Per admitting for TOA.    TOA/PID - Per GYN.  -  They recommended 14d of abx and repeat ultrasound in 6-8wks during last admission History of morbid obesity - BMI 49.03 Chronic iron  deficiency anemia/B12 deficiency - being followed by Dr. Lanny and being worked up outpatient for possible thalassemia, has been on PO iron  at home    LOS: 2 days   I reviewed nursing notes, ED provider notes, last 24 h vitals and pain scores, last 48 h intake and output, last 24 h labs and trends, and last 24 h imaging results.    Eulah Hammonds, [facility] 12/07/2023, 10:19 AM Please see Amion for pager number during day hours 7:00am-4:30pm

## 2023-12-07 NOTE — Plan of Care (Signed)

## 2023-12-08 ENCOUNTER — Other Ambulatory Visit: Payer: Self-pay

## 2023-12-08 ENCOUNTER — Inpatient Hospital Stay (HOSPITAL_COMMUNITY): Admitting: Certified Registered"

## 2023-12-08 ENCOUNTER — Inpatient Hospital Stay (HOSPITAL_COMMUNITY)

## 2023-12-08 ENCOUNTER — Encounter (HOSPITAL_COMMUNITY)
Admission: EM | Disposition: A | Discharge status: Home / Self Care | Payer: Self-pay | Source: Home / Self Care | Attending: Family Medicine

## 2023-12-08 ENCOUNTER — Encounter (HOSPITAL_COMMUNITY): Payer: Self-pay | Admitting: Internal Medicine

## 2023-12-08 DIAGNOSIS — K439 Ventral hernia without obstruction or gangrene: Secondary | ICD-10-CM

## 2023-12-08 DIAGNOSIS — N7093 Salpingitis and oophoritis, unspecified: Secondary | ICD-10-CM | POA: Diagnosis not present

## 2023-12-08 DIAGNOSIS — I10 Essential (primary) hypertension: Secondary | ICD-10-CM

## 2023-12-08 DIAGNOSIS — Z87891 Personal history of nicotine dependence: Secondary | ICD-10-CM

## 2023-12-08 DIAGNOSIS — K56609 Unspecified intestinal obstruction, unspecified as to partial versus complete obstruction: Secondary | ICD-10-CM | POA: Diagnosis not present

## 2023-12-08 HISTORY — PX: VENTRAL HERNIA REPAIR: SHX424

## 2023-12-08 SURGERY — REPAIR, HERNIA, VENTRAL
Anesthesia: General | Site: Abdomen

## 2023-12-08 MED ORDER — DEXAMETHASONE SODIUM PHOSPHATE 10 MG/ML IJ SOLN
INTRAMUSCULAR | Status: DC | PRN
  Administered 2023-12-08 (×2): 10 mg via INTRAVENOUS

## 2023-12-08 MED ORDER — PHENYLEPHRINE HCL-NACL 20-0.9 MG/250ML-% IV SOLN
INTRAVENOUS | Status: DC | PRN
  Administered 2023-12-08 (×2): 40 ug/min via INTRAVENOUS
  Administered 2023-12-08 (×2): 25 ug/min via INTRAVENOUS

## 2023-12-08 MED ORDER — LABETALOL HCL 5 MG/ML IV SOLN
INTRAVENOUS | Status: AC
  Filled 2023-12-08: qty 4

## 2023-12-08 MED ORDER — HYDROMORPHONE HCL 1 MG/ML IJ SOLN
INTRAMUSCULAR | Status: AC
  Filled 2023-12-08: qty 1

## 2023-12-08 MED ORDER — PROPOFOL 10 MG/ML IV BOLUS
INTRAVENOUS | Status: DC | PRN
  Administered 2023-12-08 (×2): 180 mg via INTRAVENOUS

## 2023-12-08 MED ORDER — AMLODIPINE BESYLATE 5 MG PO TABS
5.0000 mg | ORAL_TABLET | Freq: Every day | ORAL | Status: DC
  Administered 2023-12-08 (×2): 5 mg via ORAL
  Filled 2023-12-08 (×2): qty 1

## 2023-12-08 MED ORDER — CHLORHEXIDINE GLUCONATE 0.12 % MT SOLN
OROMUCOSAL | Status: AC
  Administered 2023-12-08 (×2): 15 mL via OROMUCOSAL
  Filled 2023-12-08: qty 15

## 2023-12-08 MED ORDER — KETOROLAC TROMETHAMINE 30 MG/ML IJ SOLN
30.0000 mg | Freq: Once | INTRAMUSCULAR | Status: AC
  Administered 2023-12-08 (×2): 30 mg via INTRAVENOUS

## 2023-12-08 MED ORDER — ACETAMINOPHEN 500 MG PO TABS: 1000.0000 mg | ORAL_TABLET | Freq: Two times a day (BID) | ORAL | Status: DC | PRN

## 2023-12-08 MED ORDER — LIDOCAINE 2% (20 MG/ML) 5 ML SYRINGE
INTRAMUSCULAR | Status: DC | PRN
  Administered 2023-12-08 (×2): 100 mg via INTRAVENOUS

## 2023-12-08 MED ORDER — HYDROMORPHONE HCL 1 MG/ML IJ SOLN
0.5000 mg | INTRAMUSCULAR | Status: DC | PRN
  Administered 2023-12-08 (×2): 0.5 mg via INTRAVENOUS

## 2023-12-08 MED ORDER — LACTATED RINGERS IV SOLN: INTRAVENOUS | Status: DC

## 2023-12-08 MED ORDER — ACETAMINOPHEN 10 MG/ML IV SOLN: 1000.0000 mg | Freq: Once | INTRAVENOUS | Status: DC | PRN

## 2023-12-08 MED ORDER — ONDANSETRON HCL 4 MG/2ML IJ SOLN
INTRAMUSCULAR | Status: DC | PRN
  Administered 2023-12-08 (×2): 4 mg via INTRAVENOUS

## 2023-12-08 MED ORDER — ONDANSETRON HCL 4 MG/2ML IJ SOLN: 4.0000 mg | Freq: Once | INTRAMUSCULAR | Status: DC | PRN

## 2023-12-08 MED ORDER — OXYCODONE HCL 5 MG/5ML PO SOLN: 5.0000 mg | Freq: Once | ORAL | Status: DC | PRN

## 2023-12-08 MED ORDER — ORAL CARE MOUTH RINSE: 15.0000 mL | Freq: Once | OROMUCOSAL | Status: AC

## 2023-12-08 MED ORDER — PROPOFOL 10 MG/ML IV BOLUS
INTRAVENOUS | Status: AC
  Filled 2023-12-08: qty 20

## 2023-12-08 MED ORDER — OXYCODONE HCL 5 MG PO TABS: 5.0000 mg | ORAL_TABLET | ORAL | Status: DC | PRN

## 2023-12-08 MED ORDER — FENTANYL CITRATE (PF) 250 MCG/5ML IJ SOLN
INTRAMUSCULAR | Status: DC | PRN
  Administered 2023-12-08 (×2): 50 ug via INTRAVENOUS
  Administered 2023-12-08 (×4): 100 ug via INTRAVENOUS

## 2023-12-08 MED ORDER — ROCURONIUM BROMIDE 10 MG/ML (PF) SYRINGE
PREFILLED_SYRINGE | INTRAVENOUS | Status: DC | PRN
  Administered 2023-12-08: 40 mg via INTRAVENOUS
  Administered 2023-12-08 (×2): 30 mg via INTRAVENOUS
  Administered 2023-12-08: 40 mg via INTRAVENOUS

## 2023-12-08 MED ORDER — FENTANYL CITRATE (PF) 250 MCG/5ML IJ SOLN
INTRAMUSCULAR | Status: AC
  Filled 2023-12-08: qty 5

## 2023-12-08 MED ORDER — FENTANYL CITRATE (PF) 100 MCG/2ML IJ SOLN
25.0000 ug | INTRAMUSCULAR | Status: DC | PRN
  Administered 2023-12-08 (×4): 50 ug via INTRAVENOUS

## 2023-12-08 MED ORDER — PHENYLEPHRINE 80 MCG/ML (10ML) SYRINGE FOR IV PUSH (FOR BLOOD PRESSURE SUPPORT)
PREFILLED_SYRINGE | INTRAVENOUS | Status: DC | PRN
  Administered 2023-12-08 (×2): 160 ug via INTRAVENOUS

## 2023-12-08 MED ORDER — HYDRALAZINE HCL 20 MG/ML IJ SOLN
INTRAMUSCULAR | Status: AC
  Filled 2023-12-08: qty 1

## 2023-12-08 MED ORDER — KETOROLAC TROMETHAMINE 30 MG/ML IJ SOLN
INTRAMUSCULAR | Status: AC
Start: 2023-12-08 — End: 2023-12-08
  Filled 2023-12-08: qty 1

## 2023-12-08 MED ORDER — HYDROMORPHONE HCL 1 MG/ML IJ SOLN: 1.0000 mg | INTRAMUSCULAR | Status: DC | PRN

## 2023-12-08 MED ORDER — LACTATED RINGERS IV SOLN: INTRAVENOUS | Status: AC

## 2023-12-08 MED ORDER — LABETALOL HCL 5 MG/ML IV SOLN
INTRAVENOUS | Status: DC | PRN
  Administered 2023-12-08 (×4): 10 mg via INTRAVENOUS

## 2023-12-08 MED ORDER — ACETAMINOPHEN 10 MG/ML IV SOLN
1000.0000 mg | Freq: Once | INTRAVENOUS | Status: AC
  Administered 2023-12-08 (×2): 1000 mg via INTRAVENOUS

## 2023-12-08 MED ORDER — DOCUSATE SODIUM 100 MG PO CAPS
100.0000 mg | ORAL_CAPSULE | Freq: Two times a day (BID) | ORAL | Status: DC
  Administered 2023-12-08 – 2023-12-13 (×12): 100 mg via ORAL
  Filled 2023-12-08 (×9): qty 1

## 2023-12-08 MED ORDER — MIDAZOLAM HCL 2 MG/2ML IJ SOLN
INTRAMUSCULAR | Status: AC
  Filled 2023-12-08: qty 2

## 2023-12-08 MED ORDER — SIMETHICONE 80 MG PO CHEW
80.0000 mg | CHEWABLE_TABLET | Freq: Four times a day (QID) | ORAL | Status: DC | PRN
  Filled 2023-12-08: qty 1

## 2023-12-08 MED ORDER — OXYCODONE HCL 5 MG PO TABS
10.0000 mg | ORAL_TABLET | ORAL | Status: DC | PRN
  Administered 2023-12-08 (×2): 10 mg via ORAL
  Filled 2023-12-08: qty 2

## 2023-12-08 MED ORDER — SUCCINYLCHOLINE CHLORIDE 200 MG/10ML IV SOSY
PREFILLED_SYRINGE | INTRAVENOUS | Status: DC | PRN
  Administered 2023-12-08 (×2): 180 mg via INTRAVENOUS

## 2023-12-08 MED ORDER — HYDRALAZINE HCL 20 MG/ML IJ SOLN: 5.0000 mg | Freq: Once | INTRAMUSCULAR | Status: DC

## 2023-12-08 MED ORDER — HYDRALAZINE HCL 20 MG/ML IJ SOLN
5.0000 mg | Freq: Once | INTRAMUSCULAR | Status: AC
  Administered 2023-12-08 (×2): 5 mg via INTRAVENOUS
  Filled 2023-12-08: qty 1

## 2023-12-08 MED ORDER — ACETAMINOPHEN 10 MG/ML IV SOLN
INTRAVENOUS | Status: AC
  Filled 2023-12-08: qty 100

## 2023-12-08 MED ORDER — FENTANYL CITRATE (PF) 100 MCG/2ML IJ SOLN
INTRAMUSCULAR | Status: AC
  Filled 2023-12-08: qty 2

## 2023-12-08 MED ORDER — SUGAMMADEX SODIUM 200 MG/2ML IV SOLN
INTRAVENOUS | Status: DC | PRN
  Administered 2023-12-08 (×2): 200 mg via INTRAVENOUS

## 2023-12-08 MED ORDER — ALBUMIN HUMAN 5 % IV SOLN: INTRAVENOUS | Status: DC | PRN

## 2023-12-08 MED ORDER — OXYCODONE HCL 5 MG PO TABS: 5.0000 mg | ORAL_TABLET | Freq: Once | ORAL | Status: DC | PRN

## 2023-12-08 MED ORDER — METHOCARBAMOL 1000 MG/10ML IJ SOLN
500.0000 mg | Freq: Four times a day (QID) | INTRAMUSCULAR | Status: DC | PRN
  Filled 2023-12-08: qty 10

## 2023-12-08 MED ORDER — CHLORHEXIDINE GLUCONATE 0.12 % MT SOLN: 15.0000 mL | Freq: Once | OROMUCOSAL | Status: AC

## 2023-12-08 MED ORDER — 0.9 % SODIUM CHLORIDE (POUR BTL) OPTIME
TOPICAL | Status: DC | PRN
Start: 2023-12-08 — End: 2023-12-08
  Administered 2023-12-08 (×2): 1000 mL

## 2023-12-08 SURGICAL SUPPLY — 51 items
BAG COUNTER SPONGE SURGICOUNT (BAG) ×1 IMPLANT
BLADE CLIPPER SURG (BLADE) IMPLANT
CANISTER SUCTION 3000ML PPV (SUCTIONS) ×1 IMPLANT
CHLORAPREP W/TINT 26 (MISCELLANEOUS) ×1 IMPLANT
COVER SURGICAL LIGHT HANDLE (MISCELLANEOUS) ×1 IMPLANT
DERMABOND ADVANCED .7 DNX12 (GAUZE/BANDAGES/DRESSINGS) IMPLANT
DRAIN CHANNEL 19F RND (DRAIN) IMPLANT
DRAPE LAPAROSCOPIC ABDOMINAL (DRAPES) ×1 IMPLANT
ELECT CAUTERY BLADE 6.4 (BLADE) ×1 IMPLANT
ELECTRODE REM PT RTRN 9FT ADLT (ELECTROSURGICAL) ×1 IMPLANT
EVACUATOR SILICONE 100CC (DRAIN) IMPLANT
GAUZE 4X4 16PLY ~~LOC~~+RFID DBL (SPONGE) IMPLANT
GAUZE SPONGE 4X4 12PLY STRL (GAUZE/BANDAGES/DRESSINGS) IMPLANT
GAUZE SPONGE 4X4 12PLY STRL LF (GAUZE/BANDAGES/DRESSINGS) IMPLANT
GLOVE BIOGEL M STRL SZ7.5 (GLOVE) ×1 IMPLANT
GLOVE INDICATOR 8.0 STRL GRN (GLOVE) ×2 IMPLANT
GLOVE SRG 8 PF TXTR STRL LF DI (GLOVE) ×1 IMPLANT
GLOVE SURG ENC MOIS LTX SZ7.5 (GLOVE) ×1 IMPLANT
GOWN STRL REUS W/ TWL LRG LVL3 (GOWN DISPOSABLE) ×2 IMPLANT
GOWN STRL REUS W/TWL 2XL LVL3 (GOWN DISPOSABLE) ×1 IMPLANT
HANDLE SUCTION POOLE (INSTRUMENTS) IMPLANT
KIT BASIN OR (CUSTOM PROCEDURE TRAY) ×1 IMPLANT
KIT TURNOVER KIT B (KITS) ×1 IMPLANT
NDL 22X1.5 STRL (OR ONLY) (MISCELLANEOUS) ×1 IMPLANT
NEEDLE 22X1.5 STRL (OR ONLY) (MISCELLANEOUS) ×1 IMPLANT
NS IRRIG 1000ML POUR BTL (IV SOLUTION) ×1 IMPLANT
PACK GENERAL/GYN (CUSTOM PROCEDURE TRAY) ×1 IMPLANT
PAD ABD 8X10 STRL (GAUZE/BANDAGES/DRESSINGS) IMPLANT
PAD ARMBOARD POSITIONER FOAM (MISCELLANEOUS) ×1 IMPLANT
PENCIL SMOKE EVACUATOR (MISCELLANEOUS) ×1 IMPLANT
RETAINER VISCERAL (MISCELLANEOUS) IMPLANT
SPONGE T-LAP 18X18 ~~LOC~~+RFID (SPONGE) IMPLANT
STAPLER SKIN PROX 35W (STAPLE) IMPLANT
SUT ETHILON 2 0 FS 18 (SUTURE) IMPLANT
SUT ETHILON 2 0 PS N (SUTURE) IMPLANT
SUT MNCRL AB 4-0 PS2 18 (SUTURE) ×1 IMPLANT
SUT NOVA 1 T20/GS 25DT (SUTURE) IMPLANT
SUT PDS AB 2-0 CT2 27 (SUTURE) ×4 IMPLANT
SUT SILK 2 0 SH CR/8 (SUTURE) IMPLANT
SUT SILK 2 0 TIES 10X30 (SUTURE) IMPLANT
SUT SILK 3 0 SH CR/8 (SUTURE) IMPLANT
SUT SILK 3 0 TIES 10X30 (SUTURE) IMPLANT
SUT STRATAFIX 1PDS 45CM VIOLET (SUTURE) IMPLANT
SUT VIC AB 2-0 CT1 TAPERPNT 27 (SUTURE) IMPLANT
SUT VIC AB 3-0 SH 8-18 (SUTURE) ×1 IMPLANT
SYR CONTROL 10ML LL (SYRINGE) ×1 IMPLANT
TAPE CLOTH 4X10 WHT NS (GAUZE/BANDAGES/DRESSINGS) IMPLANT
TOWEL GREEN STERILE (TOWEL DISPOSABLE) ×1 IMPLANT
TOWEL GREEN STERILE FF (TOWEL DISPOSABLE) ×1 IMPLANT
TOWEL ~~LOC~~+RFID 17X26 BLUE (SPONGE) IMPLANT
TRAY FOLEY MTR SLVR 14FR STAT (SET/KITS/TRAYS/PACK) IMPLANT

## 2023-12-08 NOTE — Transfer of Care (Signed)
 Immediate Anesthesia Transfer of Care Note  Patient: Victoria Chavez  Procedure(s) Performed: Exploratory Laparotomy, EXTENSIVE LYSIS OF ADHESIONS , EXPLANTATION OF MESH, Repair of HERNIA, VENTRAL (Abdomen)  Patient Location: PACU  Anesthesia Type:General  Level of Consciousness: awake and sedated  Airway & Oxygen Therapy: Patient Spontanous Breathing and Patient connected to face mask oxygen  Post-op Assessment: Report given to RN and Post -op Vital signs reviewed and stable  Post vital signs: Reviewed and stable  Last Vitals:  Vitals Value Taken Time  BP 163/99 12/08/23 17:30  Temp    Pulse 82 12/08/23 17:33  Resp 14 12/08/23 17:33  SpO2 98 % 12/08/23 17:33  Vitals shown include unfiled device data.  Last Pain:  Vitals:   12/08/23 1433  TempSrc:   PainSc: 0-No pain      Patients Stated Pain Goal: 2 (12/08/23 1433)  Complications: No notable events documented.

## 2023-12-08 NOTE — Anesthesia Preprocedure Evaluation (Addendum)
 Anesthesia Evaluation  Patient identified by MRN, date of birth, ID band Patient awake    Reviewed: Allergy & Precautions, NPO status , Patient's Chart, lab work & pertinent test results, reviewed documented beta blocker date and time   History of Anesthesia Complications Negative for: history of anesthetic complications  Airway Mallampati: III  TM Distance: >3 FB     Dental no notable dental hx.    Pulmonary shortness of breath, sleep apnea , Patient abstained from smoking., former smoker   breath sounds clear to auscultation       Cardiovascular hypertension, (-) CAD, (-) Past MI, (-) Cardiac Stents and (-) CABG  Rhythm:Regular Rate:Normal     Neuro/Psych    GI/Hepatic Bowel obstruction   Endo/Other    Class 4 obesity  Renal/GU      Musculoskeletal   Abdominal   Peds  Hematology  (+) Blood dyscrasia (Hgb 11), anemia   Anesthesia Other Findings   Reproductive/Obstetrics                              Anesthesia Physical Anesthesia Plan  ASA: 3  Anesthesia Plan: General   Post-op Pain Management:    Induction: Intravenous and Rapid sequence  PONV Risk Score and Plan: 2  Airway Management Planned: Oral ETT and Video Laryngoscope Planned  Additional Equipment:   Intra-op Plan:   Post-operative Plan: Extubation in OR  Informed Consent: I have reviewed the patients History and Physical, chart, labs and discussed the procedure including the risks, benefits and alternatives for the proposed anesthesia with the patient or authorized representative who has indicated his/her understanding and acceptance.     Dental advisory given  Plan Discussed with: CRNA  Anesthesia Plan Comments:          Anesthesia Quick Evaluation

## 2023-12-08 NOTE — Anesthesia Procedure Notes (Addendum)
 Procedure Name: Intubation Date/Time: 12/08/2023 3:01 PM  Performed by: Delores Dus, CRNAPre-anesthesia Checklist: Patient identified, Emergency Drugs available, Suction available and Patient being monitored Patient Re-evaluated:Patient Re-evaluated prior to induction Oxygen Delivery Method: Circle system utilized Preoxygenation: Pre-oxygenation with 100% oxygen Induction Type: IV induction and Rapid sequence Ventilation: Mask ventilation without difficulty Laryngoscope Size: Glidescope and 3 Grade View: Grade I Tube type: Oral Tube size: 7.0 mm Number of attempts: 1 Airway Equipment and Method: Stylet and Oral airway Placement Confirmation: ETT inserted through vocal cords under direct vision, positive ETCO2 and breath sounds checked- equal and bilateral Secured at: 22 cm Tube secured with: Tape Dental Injury: Teeth and Oropharynx as per pre-operative assessment

## 2023-12-08 NOTE — Progress Notes (Signed)
 Progress Note: General Surgery Service   Chief Complaint/Subjective: Had a bowel movement this morning but had a full liter of NG tube output overnight per the patient.  He is feeling better, but is pessimistic about continuing to feel well as she has had recurrent bowel obstruction issues.  Objective: Vital signs in last 24 hours: Temp:  [98 F (36.7 C)-98.8 F (37.1 C)] 98 F (36.7 C) (08/11 0859) Pulse Rate:  [92-108] 97 (08/11 0859) Resp:  [20] 20 (08/11 0449) BP: (149-177)/(84-119) 150/84 (08/11 0859) SpO2:  [96 %-99 %] 99 % (08/11 0859) Last BM Date : 12/03/23  Intake/Output from previous day: 08/10 0701 - 08/11 0700 In: 120 [NG/GT:120] Out: 800 [Emesis/NG output:800] Intake/Output this shift: No intake/output data recorded.  GI: Abd large midline incisional hernia underlying thin periumbilical skin   Lab Results: CBC  Recent Labs    12/05/23 1014 12/06/23 0240  WBC 11.3* 11.5*  HGB 12.2 11.0*  HCT 41.0 36.9  PLT 750* 672*   BMET Recent Labs    12/06/23 1737 12/07/23 0938  NA 142 140  K 3.4* 3.3*  CL 102 98  CO2 29 27  GLUCOSE 98 89  BUN 9 13  CREATININE 0.78 0.74  CALCIUM 9.2 9.3   PT/INR No results for input(s): LABPROT, INR in the last 72 hours. ABG No results for input(s): PHART, HCO3 in the last 72 hours.  Invalid input(s): PCO2, PO2  Anti-infectives: Anti-infectives (From admission, onward)    Start     Dose/Rate Route Frequency Ordered Stop   12/05/23 1700  piperacillin -tazobactam (ZOSYN ) IVPB 3.375 g        3.375 g 12.5 mL/hr over 240 Minutes Intravenous Every 8 hours 12/05/23 1609         Medications: Scheduled Meds:  enoxaparin  (LOVENOX ) injection  70 mg Subcutaneous Q24H   Continuous Infusions:  piperacillin -tazobactam (ZOSYN )  IV 3.375 g (12/08/23 0832)   promethazine (PHENERGAN) injection (IM or IVPB)     PRN Meds:.acetaminophen , albuterol , hydrALAZINE , ketorolac , ondansetron  **OR** ondansetron  (ZOFRAN ) IV,  promethazine (PHENERGAN) injection (IM or IVPB)  Assessment/Plan: Victoria Chavez is a 43 year old female with recurrent ventral hernia now with a bowel obstruction related to the hernia.  There is also some concern for tubo-ovarian abscess, though this may be just a hemorrhagic cyst.  She did have a bowel movement this morning which is reassuring, however she still had large amount of NG tube output.  Also this obstruction has recurred after recent hospitalization for obstruction.  At this point she would like to just proceed with surgery and attempt to treat the bowel obstruction.  We discussed that hernias.  I would not plan on doing a formal mesh hernia repair of the abdominal wall in the setting of a possible tubo-ovarian abscess and a bowel obstruction.  The plan will be to break up scar tissue, reduce the hernia contents, and primarily close the abdominal wall or close it with absorbable mesh.  I would not want a long-term mesh infection problem.  I explained we could plan on returning to surgery for formal mesh repair using robotic techniques in 6 months to a year after this type of surgery to prevent her from having recurrent hernia issues in the future.  We discussed the surgery itself as well as its risk, benefits, and alternatives.  After full discussion all questions answered the patient granted consent to proceed.  Will work to find time in the operating room to proceed to surgery urgently.   LOS: 3  days     Victoria JINNY Foy, MD  [facility] Use [email or web address] to contact on call provider  Daily Billing: [id number] - High MDM

## 2023-12-08 NOTE — Plan of Care (Signed)

## 2023-12-08 NOTE — Progress Notes (Signed)
 [facility]                                                                               Victoria Chavez, is a 43 y.o. female, DOB - 1980-06-20, FMW:[id number] Admit date - 12/05/2023    Outpatient Primary MD for the patient is Victoria Crigler, MD  LOS - 3  days    Brief summary    Victoria Chavez is a 43 y.o. female with medical history significant of hypertension, morbid obesity, sleep apnea, iron  deficiency anemia, ventral hernia s/p mesh hernia repair in 2013 with recurrent incisional hernia repair in 2021 was  recently discharged  after being treated for left tubo ovarian abscess on 11/26/3023 presents back to ED for nausea, vomiting and abdominal pain.  CT abd and pelvis shows high grade SBO with a transition point within the large inferior ventral hernia. General surgery consulted.   Assessment & Plan    Assessment and Plan:   High grade SBO Associated with nausea, vomiting and abdominal pain. Which have resolved. She had 3 BM today. She had 800 ml NG output overnight.   Gen surgery on board. Plan for  OR later today.  Continue with NPO, IV fluids, pain control and anti emetics.  NG tube placed and connected to intermittent suction.  Mild leukocytosis.  Overnight NG tube was out, NG tube to be replaced and connect to intermittent suction.      Thrombocytosis Reactive.      Hypertension:  - sub optimal.  Started the patient on IV hydralazine . Increase the IV hydralazine  to 10 mg prn.    Tubo ovarian abscess: Patient was on Augmentin  at home,but was unable to keep it down due to nausea and vomiting for 3 days.  - started patient IV zosyn . Continue the same.    Body mass index is 49.03 kg/m. Morbid obesity    Hypokalemia Replaced.     Estimated body mass index is 46.31 kg/m as calculated from the following:   Height as of this encounter: 5' 7 (1.702 m).   Weight as of this encounter: 134.1 kg.  Code Status: full code.  DVT Prophylaxis:      Level of Care: Level of care: Med-Surg Family Communication: none at bedside.   Disposition Plan:     Remains inpatient appropriate:  pending.   Procedures:  None.   Consultants:   General surgery.   Antimicrobials:   Anti-infectives (From admission, onward)    Start     Dose/Rate Route Frequency Ordered Stop   12/05/23 1700  [MAR Hold]  piperacillin -tazobactam (ZOSYN ) IVPB 3.375 g        (MAR Hold since Mon 12/08/2023 at 1346.Hold Reason: Transfer to a Procedural area)   3.375 g 12.5 mL/hr over 240 Minutes Intravenous Every 8 hours 12/05/23 1609          Medications  Scheduled Meds:  [MAR Hold] enoxaparin  (LOVENOX ) injection  70 mg Subcutaneous Q24H   Continuous Infusions:  lactated ringers  Stopped (12/08/23 1406)   lactated ringers  10 mL/hr at 12/08/23 1435   [MAR Hold] piperacillin -tazobactam (ZOSYN )  IV 3.375 g (12/08/23 0832)   [MAR Hold] promethazine (PHENERGAN) injection (IM or  IVPB)     PRN Meds:.[MAR Hold] acetaminophen , [MAR Hold] albuterol , [MAR Hold] hydrALAZINE , [MAR Hold] ketorolac , [MAR Hold] ondansetron  **OR** [MAR Hold] ondansetron  (ZOFRAN ) IV, [MAR Hold] promethazine (PHENERGAN) injection (IM or IVPB)    Subjective:   Victoria Chavez was seen and examined today.    No nausea today. Abd pain has improved.   Objective:   Vitals:   12/08/23 0449 12/08/23 0859 12/08/23 1408 12/08/23 1433  BP: (!) 156/87 (!) 150/84 (!) 190/103 (!) 186/106  Pulse: (!) 108 97 83   Resp: 20  19   Temp: 98.8 F (37.1 C) 98 F (36.7 C) 98.2 F (36.8 C)   TempSrc:  Oral Oral   SpO2: 97% 99% 99%   Weight:   134.1 kg   Height:   5' 7 (1.702 m)     Intake/Output Summary (Last 24 hours) at 12/08/2023 1517 Last data filed at 12/08/2023 0602 Gross per 24 hour  Intake 120 ml  Output 800 ml  Net -680 ml   Filed Weights   12/05/23 0959 12/05/23 1637 12/08/23 1408  Weight: (!) 142 kg (!) 142 kg 134.1 kg     Exam General exam: Appears calm and comfortable   Respiratory system: Clear to auscultation. Respiratory effort normal. Cardiovascular system: S1 & S2 heard, RRR.  Gastrointestinal system: Abdomen is soft non distended bs+ Central nervous system: Alert and oriented. No focal neurological deficits. Extremities: no edema.  Skin: No rashes,  Psychiatry:Mood & affect appropriate.     Data Reviewed:  I have personally reviewed following labs and imaging studies   CBC Lab Results  Component Value Date   WBC 11.5 (H) 12/06/2023   RBC 4.92 12/06/2023   HGB 11.0 (L) 12/06/2023   HCT 36.9 12/06/2023   MCV 75.0 (L) 12/06/2023   MCH 22.4 (L) 12/06/2023   PLT 672 (H) 12/06/2023   MCHC 29.8 (L) 12/06/2023   RDW 19.7 (H) 12/06/2023   LYMPHSABS 1.1 12/05/2023   MONOABS 0.8 12/05/2023   EOSABS 0.0 12/05/2023   BASOSABS 0.0 12/05/2023     Last metabolic panel Lab Results  Component Value Date   NA 140 12/07/2023   K 3.3 (L) 12/07/2023   CL 98 12/07/2023   CO2 27 12/07/2023   BUN 13 12/07/2023   CREATININE 0.74 12/07/2023   GLUCOSE 89 12/07/2023   GFRNONAA >60 12/07/2023   GFRAA >60 10/28/2019   CALCIUM 9.3 12/07/2023   PROT 7.0 12/06/2023   ALBUMIN  2.9 (L) 12/06/2023   BILITOT 0.2 12/06/2023   ALKPHOS 68 12/06/2023   AST 12 (L) 12/06/2023   ALT 12 12/06/2023   ANIONGAP 15 12/07/2023    CBG (last 3)  No results for input(s): GLUCAP in the last 72 hours.    Coagulation Profile: No results for input(s): INR, PROTIME in the last 168 hours.   Radiology Studies: DG Abd 1 View Result Date: 12/08/2023 CLINICAL DATA:  [id number] SBO (small bowel obstruction) (HCC) [id number]. 8 hour fall through. EXAM: ABDOMEN - 1 VIEW COMPARISON:  X-ray abdomen 12/07/2023 12:18 p.m., x-ray abdomen 12/06/2023 FINDINGS: Enteric tube courses below the hemidiaphragm with tip and side port overlying the expected region of the gastric lumen. The enteric tube is looped once with excessive tubing noted overlying the abdomen. Dilatation of several loops  of small bowel with PO contrast. No radio-opaque calculi or other significant radiographic abnormality are seen. IMPRESSION: 1. Enteric tube in good position loop to once with excessive tubing. Recommend retracting by 15 cm. 2. Small-bowel obstruction.  Several loops of small bowel dilated with PO contrast. Electronically Signed   By: Morgane  Naveau M.D.   On: 12/08/2023 00:26   DG Abd 1 View Result Date: 12/07/2023 CLINICAL DATA:  [id number] SBO (small bowel obstruction) (HCC) [id number] EXAM: ABDOMEN - 1 VIEW COMPARISON:  December 06, 2023 FINDINGS: Esophagogastric tube is coiled in the stomach, the tip is in the right mid abdomen, possibly in the proximal duodenum or gastric antrum. Generalized paucity of small bowel gas throughout the abdomen. No pneumoperitoneum. No organomegaly or radiopaque calculi. Multilevel thoracic osteophytosis. The lung bases are clear. IMPRESSION: Esophagogastric tube is coiled in the stomach, with the tip in the right mid abdomen, possibly in the proximal duodenum or gastric antrum. Electronically Signed   By: Rogelia Myers M.D.   On: 12/07/2023 12:45   DG Abd Portable 1V Result Date: 12/06/2023 CLINICAL DATA:  Nasojejunal tube. EXAM: PORTABLE ABDOMEN - 1 VIEW COMPARISON:  None Available. FINDINGS: Nasal jejunal tube is not visualized in the neck, chest or abdomen. No dilated bowel loops are seen. Lungs are is clear. IMPRESSION: Nasal jejunal tube is not visualized in the neck, chest or abdomen. Please correlate clinically. Electronically Signed   By: Greig Pique M.D.   On: 12/06/2023 22:51   DG Chest Port 1 View Result Date: 12/06/2023 CLINICAL DATA:  Nasogastric tube placement EXAM: PORTABLE CHEST 1 VIEW COMPARISON:  None Available. FINDINGS: The heart size and mediastinal contours are within normal limits. Both lungs are clear. The visualized skeletal structures are unremarkable. No nasogastric tube is identified within the visualized thorax. IMPRESSION: 1. No active disease. No  nasogastric tube identified within the visualized thorax. Electronically Signed   By: Dorethia Molt M.D.   On: 12/06/2023 22:29   DG Abd Portable 1V-Small Bowel Obstruction Protocol-initial, 8 hr delay Result Date: 12/06/2023 CLINICAL DATA:  8 hour delay for small bowel obstruction protocol. EXAM: PORTABLE ABDOMEN - 1 VIEW COMPARISON:  Abdominal x-ray 12/05/2023. CT abdomen and pelvis 12/05/2023. FINDINGS: There is minimal oral contrast seen within the stomach. No other significant oral contrast identified throughout the abdomen. There are dilated air-filled loops of small bowel in the left upper quadrant measuring 4.6 cm similar to prior. No suspicious calcifications. No acute osseous abnormality. IMPRESSION: 1. Minimal oral contrast seen within the stomach. No other significant oral contrast identified throughout the abdomen. 2. Persistent dilated air-filled loops of small bowel in the left upper quadrant. Findings are concerning for persistent small bowel obstruction. Electronically Signed   By: Greig Pique M.D.   On: 12/06/2023 22:27       Elgie Butter M.D. [facility] 12/08/2023, 3:17 PM  Available via Epic secure chat 7am-7pm After 7 pm, please refer to night coverage provider listed on amion.

## 2023-12-08 NOTE — Progress Notes (Signed)
 Several attempts to get report on patient for surgery. Unable to reach nurse. Transport will be sent for patient.

## 2023-12-08 NOTE — Op Note (Addendum)
 Patient: Victoria Chavez ([birth year], [id number])  Date of Surgery: 12/08/2023  Preoperative Diagnosis: Recurrent incarcerated ventral Hernia with small bowel obstruction (10.2 x 7.4 cm hernia area based on preoperative CT)  Postoperative Diagnosis: Recurrent incarcerated ventral Hernia with small bowel obstruction (10.2 x 7.4 cm hernia area based on preoperative CT)  Surgical Procedure:  Open ventral hernia repair  Lysis of adhesions (1 hour) Explantation of intraperitoneal mesh    Operative Team Members:  Surgeons and Role:    * Rahm Minix, Victoria PARAS, MD - Primary    * Curvin Victoria MOULD, MD - Assisting Eva Barrier, MD PHD - [facility] Resident Assistant   Anesthesiologist: Keneth Lynwood POUR, MD CRNA: Delores Dus, CRNA   Anesthesia: General   Fluids:  Total I/O In: 2050 [I.V.:1500; IV Piggyback:550] Out: 150 [Urine:50; Blood:100]  Complications: None  Drains:  19 Fr JP drain in subcutaneous space  Specimen: * No specimens in log *   Disposition:  PACU - hemodynamically stable.  Plan of Care: Continue inpatient care    Indications for Procedure: Victoria Chavez is a 43 y.o. female who presented with a small bowel obstruction related to incarcerated chronic large ventral hernia.  She has responded to conservative treatment however she represented 1 week after her most recent bowel obstruction.  Due to these recurrent ongoing issues I recommended open ventral hernia repair.  We discussed I would not plan on placing a permanent mesh in this emergent setting and with the concern for tubo-ovarian abscess.  We discussed she is at high risk of having recurrent hernia in the future, however this could be addressed robotically hopefully and a permanent mesh can be placed at that time if she has no ongoing active infection.  The procedure itself as well as its risks, benefits and alternatives were discussed.  The risks discussed included but were not limited to the risk of infection,  bleeding, damage to nearby structures, and recurrent hernia and bowel injury.  After a full discussion and all questions answered the patient granted consent to proceed.  Findings:   10.2 x 7.4 cm recurrent ventral hernia  Hernia closed primarily using interrupted figure-of-eight 0 Novafil suture   Removed intraperitoneal mesh   Description of Procedure:   On the date stated above the patient taken operating suite and placed in supine position.  General endotracheal seizures induced.  A timeout was completed verifying the correct patient, procedure, position, and equipment needed for the case.  The patient's abdomen was prepped draped in usual sterile fashion.  I made an incision around the thinned out skin overlying the hernia sac and dissected down to the fascia of the abdominal wall separating the subcutaneous tissues from the hernia sac.  I used electrocautery for hemostasis.  The skin felt quite scarred to the underlying hernia contents.  I opened the sac from the side being careful not to injure the bowel.  I was able to do successfully.  Left fold was a tedious lysis of adhesions between the abdominal wall, hernia sac, intestines, omentum.  We spent a total of 1 hour lysing adhesions.  It did appear these adhesions were the cause of the small bowel obstruction.  She had quite dense adhesions to the underside of the skin.  She had many band adhesions throughout the abdomen.  A total abdominal lysis of adhesions was completed.  The bowel obstruction appeared resolved.  We then directed our attention to the previously placed mesh.  It was located inferior and to the  left of the hernia defects.  I used electrocautery to dissected off the underside of the anterior abdominal wall.  It was passed off the field.  Hemostasis was obtained using electrocautery.  The hernia sac was resected and the fascial edges were dissected for fascial closure using electrocautery.  The hernia defect was closed in the  midline using interrupted figure-of-eight 0 Novafil suture. A 19 Fr JP drain was placed in the subcutaneous space.  The subcutaneous tissues were reapproximated and the skin was closed with staples.  A sterile dressing was applied.  All sponge needle counts were correct at the end of this case.  At the end of the case we reviewed the infection status of the case. Patient: Victoria Chavez Emergency General Surgery Service Patient Case: Urgent Infection Present At Time Of Surgery (PATOS): Concern for tubo-ovarian abscess  Victoria Foy, MD General, Bariatric, & Minimally Invasive Surgery [facility] [place]

## 2023-12-08 NOTE — Progress Notes (Signed)
   12/08/23 0936  Spiritual Encounters  Type of Visit Initial  Care provided to: Patient  Reason for visit Advance directives  OnCall Visit No   Chaplain provided AD documentation and education to Patient who is having surgery later today or tomorrow morning. Pt's sister and other family are coming today and they will discuss AD doc with Pt. Pt provided spiritual care and emotional support to Pt who expressed concern about losing weight and various other health issues. Pt draws strength from her faith and family. Chaplain advised Pt to contact Chaplain office to have the AD doc notarized. No additional spiritual need at this time.  Chaplain Therisa Samuel

## 2023-12-09 ENCOUNTER — Inpatient Hospital Stay (HOSPITAL_COMMUNITY)

## 2023-12-09 ENCOUNTER — Encounter (HOSPITAL_COMMUNITY): Payer: Self-pay | Admitting: Surgery

## 2023-12-09 ENCOUNTER — Other Ambulatory Visit (HOSPITAL_COMMUNITY)

## 2023-12-09 DIAGNOSIS — I959 Hypotension, unspecified: Secondary | ICD-10-CM | POA: Diagnosis not present

## 2023-12-09 DIAGNOSIS — D649 Anemia, unspecified: Secondary | ICD-10-CM | POA: Diagnosis not present

## 2023-12-09 DIAGNOSIS — N179 Acute kidney failure, unspecified: Secondary | ICD-10-CM

## 2023-12-09 DIAGNOSIS — K56609 Unspecified intestinal obstruction, unspecified as to partial versus complete obstruction: Secondary | ICD-10-CM | POA: Diagnosis not present

## 2023-12-09 LAB — CBC
HCT: 27.1 % — ABNORMAL LOW (ref 36.0–46.0)
Hemoglobin: 8.4 g/dL — ABNORMAL LOW (ref 12.0–15.0)
MCH: 23.5 pg — ABNORMAL LOW (ref 26.0–34.0)
MCHC: 31 g/dL (ref 30.0–36.0)
MCV: 75.7 fL — ABNORMAL LOW (ref 80.0–100.0)
Platelets: 557 K/uL — ABNORMAL HIGH (ref 150–400)
RBC: 3.58 MIL/uL — ABNORMAL LOW (ref 3.87–5.11)
RDW: 19.4 % — ABNORMAL HIGH (ref 11.5–15.5)
WBC: 12.5 K/uL — ABNORMAL HIGH (ref 4.0–10.5)
nRBC: 0 % (ref 0.0–0.2)

## 2023-12-09 LAB — CBC WITH DIFFERENTIAL/PLATELET
Abs Immature Granulocytes: 0.13 K/uL — ABNORMAL HIGH (ref 0.00–0.07)
Basophils Absolute: 0 K/uL (ref 0.0–0.1)
Basophils Relative: 0 %
Eosinophils Absolute: 0 K/uL (ref 0.0–0.5)
Eosinophils Relative: 0 %
HCT: 27.7 % — ABNORMAL LOW (ref 36.0–46.0)
Hemoglobin: 8.3 g/dL — ABNORMAL LOW (ref 12.0–15.0)
Immature Granulocytes: 1 %
Lymphocytes Relative: 5 %
Lymphs Abs: 1 K/uL (ref 0.7–4.0)
MCH: 22.8 pg — ABNORMAL LOW (ref 26.0–34.0)
MCHC: 30 g/dL (ref 30.0–36.0)
MCV: 76.1 fL — ABNORMAL LOW (ref 80.0–100.0)
Monocytes Absolute: 2 K/uL — ABNORMAL HIGH (ref 0.1–1.0)
Monocytes Relative: 9 %
Neutro Abs: 18.7 K/uL — ABNORMAL HIGH (ref 1.7–7.7)
Neutrophils Relative %: 85 %
Platelets: 643 K/uL — ABNORMAL HIGH (ref 150–400)
RBC: 3.64 MIL/uL — ABNORMAL LOW (ref 3.87–5.11)
RDW: 19.5 % — ABNORMAL HIGH (ref 11.5–15.5)
WBC: 21.8 K/uL — ABNORMAL HIGH (ref 4.0–10.5)
nRBC: 0 % (ref 0.0–0.2)

## 2023-12-09 LAB — PREPARE RBC (CROSSMATCH)

## 2023-12-09 LAB — BASIC METABOLIC PANEL WITH GFR
Anion gap: 14 (ref 5–15)
BUN: 19 mg/dL (ref 6–20)
CO2: 22 mmol/L (ref 22–32)
Calcium: 7.9 mg/dL — ABNORMAL LOW (ref 8.9–10.3)
Chloride: 101 mmol/L (ref 98–111)
Creatinine, Ser: 1.46 mg/dL — ABNORMAL HIGH (ref 0.44–1.00)
GFR, Estimated: 46 mL/min — ABNORMAL LOW (ref >60)
Glucose, Bld: 190 mg/dL — ABNORMAL HIGH (ref 70–99)
Potassium: 3.5 mmol/L (ref 3.5–5.1)
Sodium: 137 mmol/L (ref 135–145)

## 2023-12-09 LAB — LACTIC ACID, PLASMA: Lactic Acid, Venous: 1.9 mmol/L (ref 0.5–1.9)

## 2023-12-09 LAB — GLUCOSE, CAPILLARY: Glucose-Capillary: 183 mg/dL — ABNORMAL HIGH (ref 70–99)

## 2023-12-09 MED ORDER — SODIUM CHLORIDE 0.9% IV SOLUTION: Freq: Once | INTRAVENOUS | Status: AC

## 2023-12-09 MED ORDER — LACTATED RINGERS IV BOLUS
500.0000 mL | Freq: Once | INTRAVENOUS | Status: AC
  Administered 2023-12-09 (×2): 500 mL via INTRAVENOUS

## 2023-12-09 MED ORDER — HYDROMORPHONE HCL 1 MG/ML IJ SOLN
0.5000 mg | INTRAMUSCULAR | Status: DC | PRN
  Administered 2023-12-10 – 2023-12-11 (×3): 1 mg via INTRAVENOUS
  Filled 2023-12-09 (×3): qty 1

## 2023-12-09 MED ORDER — CHLORHEXIDINE GLUCONATE CLOTH 2 % EX PADS
6.0000 | MEDICATED_PAD | Freq: Every day | CUTANEOUS | Status: DC
  Administered 2023-12-09 – 2023-12-17 (×11): 6 via TOPICAL

## 2023-12-09 MED ORDER — LACTATED RINGERS IV BOLUS
1000.0000 mL | Freq: Once | INTRAVENOUS | Status: AC
  Administered 2023-12-09 (×2): 1000 mL via INTRAVENOUS

## 2023-12-09 MED ORDER — LACTATED RINGERS IV SOLN: INTRAVENOUS | Status: AC

## 2023-12-09 MED ORDER — ACETAMINOPHEN 10 MG/ML IV SOLN
1000.0000 mg | Freq: Four times a day (QID) | INTRAVENOUS | Status: AC
  Administered 2023-12-09 – 2023-12-10 (×8): 1000 mg via INTRAVENOUS
  Filled 2023-12-09 (×4): qty 100

## 2023-12-09 NOTE — Plan of Care (Signed)

## 2023-12-09 NOTE — Anesthesia Postprocedure Evaluation (Signed)
 Anesthesia Post Note  Patient: Victoria Chavez  Procedure(s) Performed: Exploratory Laparotomy, EXTENSIVE LYSIS OF ADHESIONS , EXPLANTATION OF MESH, Repair of HERNIA, VENTRAL (Abdomen)     Patient location during evaluation: PACU Anesthesia Type: General Level of consciousness: awake and alert Pain management: pain level controlled Vital Signs Assessment: post-procedure vital signs reviewed and stable Respiratory status: spontaneous breathing, nonlabored ventilation, respiratory function stable and patient connected to nasal cannula oxygen Cardiovascular status: blood pressure returned to baseline and stable Postop Assessment: no apparent nausea or vomiting Anesthetic complications: no   No notable events documented.  Last Vitals:  Vitals:   12/09/23 0214 12/09/23 0605  BP: 101/60 94/67  Pulse: 91 (!) 105  Resp: 18   Temp: 36.8 C 36.7 C  SpO2: 100% 95%    Last Pain:  Vitals:   12/09/23 0621  TempSrc:   PainSc: 4                  Lynwood MARLA Cornea

## 2023-12-09 NOTE — Progress Notes (Signed)
 1 Day Post-Op  Subjective: CC: Noted tachycardic and hypotensive on vitals this am.  On last vitals she is afebrile, HR 107, BP 85/57. She received 1.5L of IVF bolus overnight and is currently on IVF at 100ml/hr. Noted she did receive her amlodipine  last night at 2121.  Hgb 8.3 from 11.  AKI with Cr 1.46 from 0.74. Foley in place w/ UOP 0.29ml/kg/hr over the last 12 hours ( ) WBC 21.8 from 11.5. Lactic acid wnl.   RN called to bedside.  Per report staff was trying to help patient sit up in bed yesterday, patient pulled with her arms and contracted her core and then passed out. Unclear duration LOC. She was reported to have bleeding from midline incision after trying to get up. Hemostatic at this time. Drain with bloody ss output - 80cc.   She reports mild abdominal pain around her incision this am. She last received prn pain medication at 0551. She did have some nausea yesterday but denies any currently. Her NGT is clamped and she is sipping on cld. No flatus or BM since surgery. No hematochezia.   Objective: Vital signs in last 24 hours: Temp:  [97.7 F (36.5 C)-98.9 F (37.2 C)] 97.7 F (36.5 C) (08/12 0817) Pulse Rate:  [75-107] 107 (08/12 0817) Resp:  [14-20] 20 (08/12 0817) BP: (85-190)/(54-106) 85/57 (08/12 0918) SpO2:  [92 %-100 %] 93 % (08/12 0817) Weight:  [134.1 kg] 134.1 kg (08/11 1408) Last BM Date : 12/08/23  Intake/Output from previous day: 08/11 0701 - 08/12 0700 In: 3150 [I.V.:2500; IV Piggyback:600] Out: 430 [Urine:250; Drains:80; Blood:100] Intake/Output this shift: No intake/output data recorded.  PE: Gen:  Alert, NAD, pleasant Card:  Tachycardic on monitor Pulm:  Rate and effort normal Abd: Soft, mild distension, mild ttp around her incision, otherwise NT, some BS. Midline wound dressing with dried blood - removed, incision cdi with staples in place and no evidence of bleeding or drainage. No obvious evidence of recurrent hernia. JP drain SS Psych:  A&Ox3   Lab Results:  Recent Labs    12/09/23 0207  WBC 21.8*  HGB 8.3*  HCT 27.7*  PLT 643*   BMET Recent Labs    12/07/23 0938 12/09/23 0207  NA 140 137  K 3.3* 3.5  CL 98 101  CO2 27 22  GLUCOSE 89 190*  BUN 13 19  CREATININE 0.74 1.46*  CALCIUM 9.3 7.9*   PT/INR No results for input(s): LABPROT, INR in the last 72 hours. CMP     Component Value Date/Time   NA 137 12/09/2023 0207   K 3.5 12/09/2023 0207   CL 101 12/09/2023 0207   CO2 22 12/09/2023 0207   GLUCOSE 190 (H) 12/09/2023 0207   BUN 19 12/09/2023 0207   CREATININE 1.46 (H) 12/09/2023 0207   CALCIUM 7.9 (L) 12/09/2023 0207   PROT 7.0 12/06/2023 0240   ALBUMIN  2.9 (L) 12/06/2023 0240   AST 12 (L) 12/06/2023 0240   ALT 12 12/06/2023 0240   ALKPHOS 68 12/06/2023 0240   BILITOT 0.2 12/06/2023 0240   GFRNONAA 46 (L) 12/09/2023 0207   GFRAA >60 10/28/2019 0422   Lipase     Component Value Date/Time   LIPASE 25 12/05/2023 1014    Studies/Results: DG Abd Portable 1V Result Date: 12/08/2023 CLINICAL DATA:  Nasogastric tube placement. EXAM: PORTABLE ABDOMEN - 1 VIEW COMPARISON:  December 07, 2023 FINDINGS: A nasogastric tube is seen. While its distal end is looped within the body of the stomach,  its distal tip is not included in the field of view. Low lung volumes are noted with mild atelectasis and/or infiltrate seen within the bilateral lung bases. The bowel gas pattern is normal. No radio-opaque calculi or other significant radiographic abnormality are seen. IMPRESSION: Nasogastric tube positioning, as described above. Electronically Signed   By: Suzen Dials M.D.   On: 12/08/2023 19:17   DG Abd 1 View Result Date: 12/08/2023 CLINICAL DATA:  [id number] SBO (small bowel obstruction) (HCC) [id number]. 8 hour fall through. EXAM: ABDOMEN - 1 VIEW COMPARISON:  X-ray abdomen 12/07/2023 12:18 p.m., x-ray abdomen 12/06/2023 FINDINGS: Enteric tube courses below the hemidiaphragm with tip and side port overlying the  expected region of the gastric lumen. The enteric tube is looped once with excessive tubing noted overlying the abdomen. Dilatation of several loops of small bowel with PO contrast. No radio-opaque calculi or other significant radiographic abnormality are seen. IMPRESSION: 1. Enteric tube in good position loop to once with excessive tubing. Recommend retracting by 15 cm. 2. Small-bowel obstruction. Several loops of small bowel dilated with PO contrast. Electronically Signed   By: Morgane  Naveau M.D.   On: 12/08/2023 00:26   DG Abd 1 View Result Date: 12/07/2023 CLINICAL DATA:  [id number] SBO (small bowel obstruction) (HCC) [id number] EXAM: ABDOMEN - 1 VIEW COMPARISON:  December 06, 2023 FINDINGS: Esophagogastric tube is coiled in the stomach, the tip is in the right mid abdomen, possibly in the proximal duodenum or gastric antrum. Generalized paucity of small bowel gas throughout the abdomen. No pneumoperitoneum. No organomegaly or radiopaque calculi. Multilevel thoracic osteophytosis. The lung bases are clear. IMPRESSION: Esophagogastric tube is coiled in the stomach, with the tip in the right mid abdomen, possibly in the proximal duodenum or gastric antrum. Electronically Signed   By: Rogelia Myers M.D.   On: 12/07/2023 12:45    Anti-infectives: Anti-infectives (From admission, onward)    Start     Dose/Rate Route Frequency Ordered Stop   12/05/23 1700  piperacillin -tazobactam (ZOSYN ) IVPB 3.375 g        3.375 g 12.5 mL/hr over 240 Minutes Intravenous Every 8 hours 12/05/23 1609          Assessment/Plan POD 1 s/p open ventral hernia repair, LOA x 1 hour, explantation of intraperitoneal mesh by Dr. Lyndel on 12/08/23 for Recurrent incarcerated ventral Hernia with small bowel obstruction  - Noted tachycardia and hypotension overnight. WBC elevated but afebrile, LA wnl and abdomen is very soft. She had bleeding from her midline wound and hgb 8.3 from 11 (did receive 1.5L IVF bolus overnight, so may  be in part dilutional). Wound is currently hemostatic.  With ongoing tachycardia and hypotension will order 1U of PRBC and continue to trend.  - Would keep NPO with NGT to LIWS for now - Cont JP, bloody SS, 80cc/24 hours - Mobilize, pulm toilet  FEN - NPO, NGT to LIWS, IVF per primary  VTE - SCDs, hold in setting of ABL anemia  ID - Zosyn  per primary for TOA. Does not need abx from our standpoint Foley - In place, continue today given low UOP in setting of AKI to monitor I/O.  AKI - Cr 1.46 from 0.74. Stop toradol . Avoid nephrotoxic medications. Cont mIVF. Strict I/O. Message sent to [facility] and pharmacy HTN - hold home meds in setting of hypotension.  TOA - Per GYN. Abx. They recommended 14d of abx and repeat ultrasound in 6-8wks during last admission    LOS: 4 days    Ozell  CHRISTELLA Shaper, [facility] 12/09/2023, 10:33 AM Please see Amion for pager number during day hours 7:00am-4:30pm

## 2023-12-09 NOTE — Progress Notes (Signed)
 [facility]                                                                               [name], is a 43 y.o. female, DOB - Feb 12, 1981, FMW:[id number] Admit date - 12/05/2023    Outpatient Primary MD for the patient is Victoria Crigler, MD  LOS - 4  days    Brief summary    Victoria Chavez is a 44 y.o. female with medical history significant of hypertension, morbid obesity, sleep apnea, iron  deficiency anemia, ventral hernia s/p mesh hernia repair in 2013 with recurrent incisional hernia repair in 2021 was  recently discharged  after being treated for left tubo ovarian abscess on 11/26/3023 presents back to ED for nausea, vomiting and abdominal pain.  CT abd and pelvis shows high grade SBO with a transition point within the large inferior ventral hernia. General surgery consulted.   Assessment & Plan    Assessment and Plan:   High grade SBO Associated with nausea, vomiting and abdominal pain.  s/p open ventral hernia repair,  explantation of intraperitoneal mesh on 8/11 for recurrent incarcerated ventral hernia with SBO. NG tube placed and connected to intermittent suction.  Continue with NPO status and IV fluids, pain control and mobilize as tolerated.   follow up wbc count and lactic acid wnl.    Thrombocytosis Reactive.      Hypertension:  Patient had an episode of hypotension last night and BP has been 85/57 mmhg.  Holding all BP meds at this time.    Tubo ovarian abscess: Patient was on Augmentin  at home,but was unable to keep it down due to nausea and vomiting for 3 days.  - started patient IV zosyn . Continue the same.    Body mass index is 49.03 kg/m. Morbid obesity    Hypokalemia Replaced. Repeat in am.    Acute kidney injury Secondary to hypovolemia, hypotension and blood loss.  Hydrate and repeat renal parameters in am.  Check US  Renal.  Creatinine at  baseline around 0.7, today creatinine is 1.4. Repeat renal parameters in am.     Presyncopal episode last night.  Dizziness suspect from blood loss anemia from OR.  Hemoglobin on admission around 11, dropped to 8.3 this am.  S/p 1 unit of prbc transfusion.        Estimated body mass index is 46.31 kg/m as calculated from the following:   Height as of this encounter: 5' 7 (1.702 m).   Weight as of this encounter: 134.1 kg.  Code Status: full code.  DVT Prophylaxis:  scd's   Level of Care: Level of care: Progressive Family Communication: none at bedside.   Disposition Plan:     Remains inpatient appropriate:  pending.   Procedures:  None.   Consultants:   General surgery.   Antimicrobials:   Anti-infectives (From admission, onward)    Start     Dose/Rate Route Frequency Ordered Stop   12/05/23 1700  piperacillin -tazobactam (ZOSYN ) IVPB 3.375 g        3.375 g 12.5 mL/hr over 240 Minutes Intravenous Every 8 hours 12/05/23 1609          Medications  Scheduled Meds:  sodium chloride    Intravenous Once   Chlorhexidine  Gluconate Cloth  6 each Topical Daily   docusate sodium   100 mg Oral BID   Continuous Infusions:  acetaminophen  Stopped (12/09/23 1356)   lactated ringers  100 mL/hr at 12/09/23 1550   piperacillin -tazobactam (ZOSYN )  IV Stopped (12/09/23 1322)   promethazine (PHENERGAN) injection (IM or IVPB)     PRN Meds:.albuterol , hydrALAZINE , HYDROmorphone  (DILAUDID ) injection, ondansetron  **OR** ondansetron  (ZOFRAN ) IV, promethazine (PHENERGAN) injection (IM or IVPB), simethicone     Subjective:   [name] was seen and examined today.   Incision pain in the abdomen. Generalized weakness.   Objective:   Vitals:   12/09/23 1110 12/09/23 1155 12/09/23 1345 12/09/23 1610  BP: 92/63 (!) 104/59 116/70 103/71  Pulse: (!) 113 (!) 108 (!) 110 (!) 105  Resp: 20 20 20 20   Temp: 98 F (36.7 C) 98 F (36.7 C) 100.3 F (37.9 C) 98.4 F (36.9 C)  TempSrc: Oral  Oral Oral  SpO2: 95% 94% 96% 93%  Weight:      Height:         Intake/Output Summary (Last 24 hours) at 12/09/2023 1805 Last data filed at 12/09/2023 1550 Gross per 24 hour  Intake 2532 ml  Output 280 ml  Net 2252 ml   Filed Weights   12/05/23 0959 12/05/23 1637 12/08/23 1408  Weight: (!) 142 kg (!) 142 kg 134.1 kg     Exam General exam: Appears calm and comfortable  Respiratory system: Clear to auscultation. Respiratory effort normal. Cardiovascular system: S1 & S2 heard, RRR. No JVD,  Gastrointestinal system: Abdomen is soft mild general tenderness.  Central nervous system: Alert and oriented.  Extremities: Symmetric 5 x 5 power. Skin: No rashes, Psychiatry:  Mood & affect appropriate.      Data Reviewed:  I have personally reviewed following labs and imaging studies   CBC Lab Results  Component Value Date   WBC 12.5 (H) 12/09/2023   RBC 3.58 (L) 12/09/2023   HGB 8.4 (L) 12/09/2023   HCT 27.1 (L) 12/09/2023   MCV 75.7 (L) 12/09/2023   MCH 23.5 (L) 12/09/2023   PLT 557 (H) 12/09/2023   MCHC 31.0 12/09/2023   RDW 19.4 (H) 12/09/2023   LYMPHSABS 1.0 12/09/2023   MONOABS 2.0 (H) 12/09/2023   EOSABS 0.0 12/09/2023   BASOSABS 0.0 12/09/2023     Last metabolic panel Lab Results  Component Value Date   NA 137 12/09/2023   K 3.5 12/09/2023   CL 101 12/09/2023   CO2 22 12/09/2023   BUN 19 12/09/2023   CREATININE 1.46 (H) 12/09/2023   GLUCOSE 190 (H) 12/09/2023   GFRNONAA 46 (L) 12/09/2023   GFRAA >60 10/28/2019   CALCIUM 7.9 (L) 12/09/2023   PROT 7.0 12/06/2023   ALBUMIN  2.9 (L) 12/06/2023   BILITOT 0.2 12/06/2023   ALKPHOS 68 12/06/2023   AST 12 (L) 12/06/2023   ALT 12 12/06/2023   ANIONGAP 14 12/09/2023    CBG (last 3)  Recent Labs    12/09/23 0049  GLUCAP 183*      Coagulation Profile: No results for input(s): INR, PROTIME in the last 168 hours.   Radiology Studies: US  RENAL Result Date: 12/09/2023 CLINICAL DATA:  Acute kidney injury EXAM: RENAL / URINARY TRACT ULTRASOUND COMPLETE COMPARISON:   CT 12/05/2023. FINDINGS: Right Kidney: Renal measurements: 12 x 5.9 x 5.3 cm = volume: 193.9 mL. Echogenicity within normal limits. No mass or hydronephrosis visualized. Left Kidney: Renal measurements: 9.9 x 4.4 x  4.9 cm = volume: 112.2 mL. Echogenicity within normal limits. No mass or hydronephrosis visualized. Bladder: Decompressed by Foley catheter Other: None. IMPRESSION: Negative renal ultrasound Electronically Signed   By: Luke Bun M.D.   On: 12/09/2023 16:16   DG Abd Portable 1V Result Date: 12/08/2023 CLINICAL DATA:  Nasogastric tube placement. EXAM: PORTABLE ABDOMEN - 1 VIEW COMPARISON:  December 07, 2023 FINDINGS: A nasogastric tube is seen. While its distal end is looped within the body of the stomach, its distal tip is not included in the field of view. Low lung volumes are noted with mild atelectasis and/or infiltrate seen within the bilateral lung bases. The bowel gas pattern is normal. No radio-opaque calculi or other significant radiographic abnormality are seen. IMPRESSION: Nasogastric tube positioning, as described above. Electronically Signed   By: Suzen Dials M.D.   On: 12/08/2023 19:17   DG Abd 1 View Result Date: 12/08/2023 CLINICAL DATA:  [id number] SBO (small bowel obstruction) (HCC) [id number]. 8 hour fall through. EXAM: ABDOMEN - 1 VIEW COMPARISON:  X-ray abdomen 12/07/2023 12:18 p.m., x-ray abdomen 12/06/2023 FINDINGS: Enteric tube courses below the hemidiaphragm with tip and side port overlying the expected region of the gastric lumen. The enteric tube is looped once with excessive tubing noted overlying the abdomen. Dilatation of several loops of small bowel with PO contrast. No radio-opaque calculi or other significant radiographic abnormality are seen. IMPRESSION: 1. Enteric tube in good position loop to once with excessive tubing. Recommend retracting by 15 cm. 2. Small-bowel obstruction. Several loops of small bowel dilated with PO contrast. Electronically Signed   By:  Morgane  Naveau M.D.   On: 12/08/2023 00:26       Elgie Butter M.D. [facility] 12/09/2023, 6:05 PM  Available via Epic secure chat 7am-7pm After 7 pm, please refer to night coverage provider listed on amion.

## 2023-12-10 DIAGNOSIS — K56609 Unspecified intestinal obstruction, unspecified as to partial versus complete obstruction: Secondary | ICD-10-CM | POA: Diagnosis not present

## 2023-12-10 DIAGNOSIS — N179 Acute kidney failure, unspecified: Secondary | ICD-10-CM | POA: Diagnosis not present

## 2023-12-10 LAB — CBC
HCT: 26.7 % — ABNORMAL LOW (ref 36.0–46.0)
Hemoglobin: 8.3 g/dL — ABNORMAL LOW (ref 12.0–15.0)
MCH: 23.6 pg — ABNORMAL LOW (ref 26.0–34.0)
MCHC: 31.1 g/dL (ref 30.0–36.0)
MCV: 75.9 fL — ABNORMAL LOW (ref 80.0–100.0)
Platelets: 544 K/uL — ABNORMAL HIGH (ref 150–400)
RBC: 3.52 MIL/uL — ABNORMAL LOW (ref 3.87–5.11)
RDW: 19.7 % — ABNORMAL HIGH (ref 11.5–15.5)
WBC: 12.2 K/uL — ABNORMAL HIGH (ref 4.0–10.5)
nRBC: 0 % (ref 0.0–0.2)

## 2023-12-10 LAB — BASIC METABOLIC PANEL WITH GFR
Anion gap: 14 (ref 5–15)
Anion gap: 15 (ref 5–15)
BUN: 26 mg/dL — ABNORMAL HIGH (ref 6–20)
BUN: 28 mg/dL — ABNORMAL HIGH (ref 6–20)
CO2: 25 mmol/L (ref 22–32)
CO2: 25 mmol/L (ref 22–32)
Calcium: 8.3 mg/dL — ABNORMAL LOW (ref 8.9–10.3)
Calcium: 8.4 mg/dL — ABNORMAL LOW (ref 8.9–10.3)
Chloride: 97 mmol/L — ABNORMAL LOW (ref 98–111)
Chloride: 99 mmol/L (ref 98–111)
Creatinine, Ser: 4.03 mg/dL — ABNORMAL HIGH (ref 0.44–1.00)
Creatinine, Ser: 3.77 mg/dL — ABNORMAL HIGH (ref 0.44–1.00)
GFR, Estimated: 13 mL/min — ABNORMAL LOW (ref >60)
GFR, Estimated: 15 mL/min — ABNORMAL LOW (ref >60)
Glucose, Bld: 122 mg/dL — ABNORMAL HIGH (ref 70–99)
Glucose, Bld: 110 mg/dL — ABNORMAL HIGH (ref 70–99)
Potassium: 3.5 mmol/L (ref 3.5–5.1)
Potassium: 3.7 mmol/L (ref 3.5–5.1)
Sodium: 136 mmol/L (ref 135–145)
Sodium: 139 mmol/L (ref 135–145)

## 2023-12-10 LAB — URINALYSIS, ROUTINE W REFLEX MICROSCOPIC
Bilirubin Urine: NEGATIVE
Glucose, UA: NEGATIVE mg/dL
Ketones, ur: 5 mg/dL — AB
Leukocytes,Ua: NEGATIVE
Nitrite: NEGATIVE
Protein, ur: 100 mg/dL — AB
Specific Gravity, Urine: 1.023 (ref 1.005–1.030)
pH: 5 (ref 5.0–8.0)

## 2023-12-10 LAB — GLUCOSE, CAPILLARY: Glucose-Capillary: 118 mg/dL — ABNORMAL HIGH (ref 70–99)

## 2023-12-10 LAB — MAGNESIUM: Magnesium: 1.9 mg/dL (ref 1.7–2.4)

## 2023-12-10 LAB — SODIUM, URINE, RANDOM: Sodium, Ur: 51 mmol/L

## 2023-12-10 LAB — CREATININE, URINE, RANDOM: Creatinine, Urine: 160 mg/dL

## 2023-12-10 MED ORDER — SODIUM CHLORIDE 0.9 % IV SOLN: INTRAVENOUS | Status: DC

## 2023-12-10 MED ORDER — ACETAMINOPHEN 10 MG/ML IV SOLN
1000.0000 mg | Freq: Four times a day (QID) | INTRAVENOUS | Status: AC
  Administered 2023-12-10 – 2023-12-11 (×5): 1000 mg via INTRAVENOUS
  Filled 2023-12-10 (×3): qty 100

## 2023-12-10 MED ORDER — ACETAMINOPHEN 10 MG/ML IV SOLN
1000.0000 mg | Freq: Four times a day (QID) | INTRAVENOUS | Status: DC
  Filled 2023-12-10 (×2): qty 100

## 2023-12-10 MED ORDER — PANTOPRAZOLE SODIUM 40 MG IV SOLR
40.0000 mg | INTRAVENOUS | Status: DC
  Administered 2023-12-10 – 2023-12-15 (×7): 40 mg via INTRAVENOUS
  Filled 2023-12-10 (×6): qty 10

## 2023-12-10 NOTE — Progress Notes (Signed)
 Provider advised of pt having 8 beats of vtach  &  is c/o heartburn since having her NG tube flushed. Provider also notified of MASD in skin folds( powder used initially but interdry ordered) & vaginal discharge yesterday that was yellowish [place] & sticky. Pt denies itchiness & it was malodorous. New orders in chart. Will continue to monitor the pt.

## 2023-12-10 NOTE — Hospital Course (Addendum)
 Victoria Chavez is a 43 y.o. female with a history of hypertension, morbid obesity, sleep apnea, iron  deficiency anemia, ventral hernia status post mesh repair, left tubo-ovarian abscess.  Patient presented secondary to emesis and abdominal pain was found to have evidence of high-grade small bowel obstruction secondary to known ventral hernia.  General surgery was consulted and performed an open repair.  Hospitalization complicated by development of AKI and postoperative anemia.

## 2023-12-10 NOTE — Progress Notes (Signed)
   12/10/23 1735  Assess: MEWS Score  Temp 97.6 F (36.4 C)  BP 127/87  MAP (mmHg) 100  Pulse Rate (!) 111  ECG Heart Rate (!) 113  Resp 19  SpO2 94 %  O2 Device Room Air  Assess: MEWS Score  MEWS Temp 0  MEWS Systolic 0  MEWS Pulse 2  MEWS RR 0  MEWS LOC 0  MEWS Score 2  MEWS Score Color Yellow  Assess: if the MEWS score is Yellow or Red  Were vital signs accurate and taken at a resting state? Yes  Does the patient meet 2 or more of the SIRS criteria? Yes  MEWS guidelines implemented  Yes, yellow  Treat  MEWS Interventions Considered administering scheduled or prn medications/treatments as ordered  Take Vital Signs  Increase Vital Sign Frequency  Yellow: Q2hr x1, continue Q4hrs until patient remains Fernandez for 12hrs  Escalate  MEWS: Escalate Yellow: Discuss with charge nurse and consider notifying provider and/or RRT  Notify: Charge Nurse/RN  Name of Charge Nurse/RN Notified sarah v  Provider Notification  Provider Name/Title Elgin Lam, MD  Date Provider Notified 12/10/23  Time Provider Notified [phone]  Method of Notification Page  Notification Reason Other (Comment) (yellow mews)  Assess: SIRS CRITERIA  SIRS Temperature  0  SIRS Respirations  0  SIRS Pulse 1  SIRS WBC 0  SIRS Score Sum  1

## 2023-12-10 NOTE — Plan of Care (Signed)
  Problem: Activity: Goal: Risk for activity intolerance will decrease Outcome: Progressing   Problem: Nutrition: Goal: Adequate nutrition will be maintained Outcome: Not Progressing Note: Npo d/t ng tube & sbo   Problem: Coping: Goal: Level of anxiety will decrease Outcome: Progressing   Problem: Elimination: Goal: Will not experience complications related to bowel motility Outcome: Not Progressing Note: NG tube still in

## 2023-12-10 NOTE — Progress Notes (Addendum)
 PROGRESS NOTE    Mirjana Tarleton  FMW:[id number] DOB: 1981-02-01 DOA: 12/05/2023 PCP: Ilah Crigler, MD   Brief Narrative: Victoria Chavez is a 43 y.o. female with a history of hypertension, morbid obesity, sleep apnea, iron  deficiency anemia, ventral hernia status post mesh repair, left tubo-ovarian abscess.  Patient presented secondary to emesis and abdominal pain was found to have evidence of high-grade small bowel obstruction secondary to known ventral hernia.  General surgery was consulted and performed an open repair.  Hospitalization complicated by development of AKI.   Assessment and Plan:  High grade small bowel obstruction Patient with associated nausea, vomiting, abdominal pain.  Bowel obstruction diagnosed on CT imaging with evidence of a transition point within the large inferior ventral hernia.  General surgery was consulted and performed an open ventral hernia repair with lysis of adhesions and explantation of intraperitoneal mesh on 8/11. - General Surgery recommendations: Continue NG tube to low intermittent wall suction  AKI Baseline creatinine of 0.7. Severe worsening of renal function likely secondary to ATN from hypotension.  Renal ultrasound obtained on 8/12 significant for no abnormalities noted.  Urine output not well documented, but today patient has had at least 300 mL of output. -Strict in/out -Daily BMP -IV fluids -Repeat urinalysis -If no improvement, will consult nephrology in AM  Acute blood loss anemia Chronic iron  deficiency anemia Baseline hemoglobin of 10-11. Hemoglobin of 12.2 on admission which immediately decreased to 11. Postoperatively, hemoglobin down to 8.3. Patient with perioperative blood loss documented at 100 mL. Patient received 1 unit of PRBC on 8/12. Post-transfusion hemoglobin of 8.3 and has remained stable.  Hypotension Presumed secondary to blood loss anemia and exacerbated by antihypertensive medications. Blood pressure  improved.  Thrombocytosis Likely reactive. Improving.  Primary hypertension Patient is managed on amlodipine  and spironolactone  as an outpatient which are held secondary to hypotension.  History of tuboovarian abscess Present on admission and diagnosed on prior admission. Patient started on IV antibiotics at that time and transitioned to oral Augmentin  to complete a two-week course. Course completed at this time. Gynecology recommendations for repeat pelvic ultrasound in 6-8 weeks from diagnosis. - Discontinue Zosyn   Presyncope Occurred during admission. Presumed secondary to hypotension from perioperative blood loss.  Obesity, class III Estimated body mass index is 46.31 kg/m as calculated from the following:   Height as of this encounter: 5' 7 (1.702 m).   Weight as of this encounter: 134.1 kg.   DVT prophylaxis: SCDs Code Status:   Code Status: Full Code Family Communication: None at bedside Disposition Plan: Discharge home likely in several days pending improvement of AKI in addition to ongoing general surgery recommendations/management   Consultants:  General surgery  Procedures:  General surgery Open ventral hernia repair  Lysis of adhesions Explantation of intraperitoneal mesh  Antimicrobials: Zosyn  IV   Subjective: Patient reports no issues this morning overnight.  Feels well.  No abdominal pain.  Objective: BP 136/80 (BP Location: Right Arm)   Pulse (!) 106   Temp 97.9 F (36.6 C) (Oral)   Resp 19   Ht 5' 7 (1.702 m)   Wt 134.1 kg   LMP 11/10/2023 (Exact Date)   SpO2 91%   BMI 46.31 kg/m   Examination:  General exam: Appears calm and comfortable Respiratory system: Clear to auscultation. Respiratory effort normal. Cardiovascular system: S1 & S2 heard, RRR. 2/6 systolic murmur Gastrointestinal system: Abdomen is non-distended and soft.  Decreased and high-pitched bowel sounds heard. Central nervous system: Alert and oriented.  No focal  neurological deficits. Musculoskeletal: No calf tenderness Psychiatry: Judgement and insight appear normal. Mood & affect appropriate.    Data Reviewed: I have personally reviewed following labs and imaging studies  CBC Lab Results  Component Value Date   WBC 12.2 (H) 12/10/2023   RBC 3.52 (L) 12/10/2023   HGB 8.3 (L) 12/10/2023   HCT 26.7 (L) 12/10/2023   MCV 75.9 (L) 12/10/2023   MCH 23.6 (L) 12/10/2023   PLT 544 (H) 12/10/2023   MCHC 31.1 12/10/2023   RDW 19.7 (H) 12/10/2023   LYMPHSABS 1.0 12/09/2023   MONOABS 2.0 (H) 12/09/2023   EOSABS 0.0 12/09/2023   BASOSABS 0.0 12/09/2023     Last metabolic panel Lab Results  Component Value Date   NA 136 12/10/2023   K 3.5 12/10/2023   CL 97 (L) 12/10/2023   CO2 25 12/10/2023   BUN 26 (H) 12/10/2023   CREATININE 4.03 (H) 12/10/2023   GLUCOSE 122 (H) 12/10/2023   GFRNONAA 13 (L) 12/10/2023   GFRAA >60 10/28/2019   CALCIUM 8.3 (L) 12/10/2023   PROT 7.0 12/06/2023   ALBUMIN  2.9 (L) 12/06/2023   BILITOT 0.2 12/06/2023   ALKPHOS 68 12/06/2023   AST 12 (L) 12/06/2023   ALT 12 12/06/2023   ANIONGAP 14 12/10/2023    GFR: Estimated Creatinine Clearance: 25.7 mL/min (A) (by C-G formula based on SCr of 4.03 mg/dL (H)).  No results found for this or any previous visit (from the past 240 hours).    Radiology Studies: US  RENAL Result Date: 12/09/2023 CLINICAL DATA:  Acute kidney injury EXAM: RENAL / URINARY TRACT ULTRASOUND COMPLETE COMPARISON:  CT 12/05/2023. FINDINGS: Right Kidney: Renal measurements: 12 x 5.9 x 5.3 cm = volume: 193.9 mL. Echogenicity within normal limits. No mass or hydronephrosis visualized. Left Kidney: Renal measurements: 9.9 x 4.4 x 4.9 cm = volume: 112.2 mL. Echogenicity within normal limits. No mass or hydronephrosis visualized. Bladder: Decompressed by Foley catheter Other: None. IMPRESSION: Negative renal ultrasound Electronically Signed   By: Luke Bun M.D.   On: 12/09/2023 16:16   DG Abd  Portable 1V Result Date: 12/08/2023 CLINICAL DATA:  Nasogastric tube placement. EXAM: PORTABLE ABDOMEN - 1 VIEW COMPARISON:  December 07, 2023 FINDINGS: A nasogastric tube is seen. While its distal end is looped within the body of the stomach, its distal tip is not included in the field of view. Low lung volumes are noted with mild atelectasis and/or infiltrate seen within the bilateral lung bases. The bowel gas pattern is normal. No radio-opaque calculi or other significant radiographic abnormality are seen. IMPRESSION: Nasogastric tube positioning, as described above. Electronically Signed   By: Suzen Dials M.D.   On: 12/08/2023 19:17      LOS: 5 days    Elgin Lam, MD [facility] 12/10/2023, 7:59 AM   If 7PM-7AM, please contact night-coverage [email or web address]

## 2023-12-10 NOTE — Progress Notes (Addendum)
 2 Days Post-Op  Subjective: CC: Patient reports no further bleeding from her incision.  She reports mild pain at her incision this am. No other areas of pain. No nausea. NGT weith 100cc/24 hours of bilious output. No flatus or BM. She is mobilizing.   Tmax 100.3. HR 106. Hypotension resolved.  Hgb 8.3 --> S/p 1U PRBC yesterday --> 8.4. Hgb stable at 8.3 this am.  Cr 0.74 --> 1.46 --> 4.03. UOP 182ml/24 hours.   Objective: Vital signs in last 24 hours: Temp:  [97.9 F (36.6 C)-100.3 F (37.9 C)] 97.9 F (36.6 C) (08/13 0816) Pulse Rate:  [103-113] 106 (08/13 0746) Resp:  [16-20] 16 (08/13 0816) BP: (92-138)/(59-85) 138/76 (08/13 0816) SpO2:  [90 %-96 %] 92 % (08/13 0816) Last BM Date : 12/08/23  Intake/Output from previous day: 08/12 0701 - 08/13 0700 In: 1432 [I.V.:1000; Blood:282; IV Piggyback:150] Out: -  Intake/Output this shift: Total I/O In: -  Out: 220 [Urine:100; Emesis/NG output:100; Drains:20]  PE: Gen:  Alert, NAD, pleasant Pulm:  Rate and effort normal Abd: Soft, mild distension, mild ttp around her incision, otherwise NT, some BS. Midline wound dressing with dried blood - removed, incision cdi with staples in place and no evidence of bleeding or drainage. No obvious evidence of recurrent hernia. JP drain bloody SS - 20cc/24 hours  GU: Foley in place with straw colored urine in foley bag - 200cc in bag currently.  Psych: A&Ox3   Lab Results:  Recent Labs    12/09/23 1704 12/10/23 0409  WBC 12.5* 12.2*  HGB 8.4* 8.3*  HCT 27.1* 26.7*  PLT 557* 544*   BMET Recent Labs    12/09/23 0207 12/10/23 0409  NA 137 136  K 3.5 3.5  CL 101 97*  CO2 22 25  GLUCOSE 190* 122*  BUN 19 26*  CREATININE 1.46* 4.03*  CALCIUM 7.9* 8.3*   PT/INR No results for input(s): LABPROT, INR in the last 72 hours. CMP     Component Value Date/Time   NA 136 12/10/2023 0409   K 3.5 12/10/2023 0409   CL 97 (L) 12/10/2023 0409   CO2 25 12/10/2023 0409   GLUCOSE  122 (H) 12/10/2023 0409   BUN 26 (H) 12/10/2023 0409   CREATININE 4.03 (H) 12/10/2023 0409   CALCIUM 8.3 (L) 12/10/2023 0409   PROT 7.0 12/06/2023 0240   ALBUMIN  2.9 (L) 12/06/2023 0240   AST 12 (L) 12/06/2023 0240   ALT 12 12/06/2023 0240   ALKPHOS 68 12/06/2023 0240   BILITOT 0.2 12/06/2023 0240   GFRNONAA 13 (L) 12/10/2023 0409   GFRAA >60 10/28/2019 0422   Lipase     Component Value Date/Time   LIPASE 25 12/05/2023 1014    Studies/Results: US  RENAL Result Date: 12/09/2023 CLINICAL DATA:  Acute kidney injury EXAM: RENAL / URINARY TRACT ULTRASOUND COMPLETE COMPARISON:  CT 12/05/2023. FINDINGS: Right Kidney: Renal measurements: 12 x 5.9 x 5.3 cm = volume: 193.9 mL. Echogenicity within normal limits. No mass or hydronephrosis visualized. Left Kidney: Renal measurements: 9.9 x 4.4 x 4.9 cm = volume: 112.2 mL. Echogenicity within normal limits. No mass or hydronephrosis visualized. Bladder: Decompressed by Foley catheter Other: None. IMPRESSION: Negative renal ultrasound Electronically Signed   By: Luke Bun M.D.   On: 12/09/2023 16:16   DG Abd Portable 1V Result Date: 12/08/2023 CLINICAL DATA:  Nasogastric tube placement. EXAM: PORTABLE ABDOMEN - 1 VIEW COMPARISON:  December 07, 2023 FINDINGS: A nasogastric tube is seen. While its distal  end is looped within the body of the stomach, its distal tip is not included in the field of view. Low lung volumes are noted with mild atelectasis and/or infiltrate seen within the bilateral lung bases. The bowel gas pattern is normal. No radio-opaque calculi or other significant radiographic abnormality are seen. IMPRESSION: Nasogastric tube positioning, as described above. Electronically Signed   By: Suzen Dials M.D.   On: 12/08/2023 19:17    Anti-infectives: Anti-infectives (From admission, onward)    Start     Dose/Rate Route Frequency Ordered Stop   12/05/23 1700  piperacillin -tazobactam (ZOSYN ) IVPB 3.375 g        3.375 g 12.5 mL/hr  over 240 Minutes Intravenous Every 8 hours 12/05/23 1609          Assessment/Plan POD 2 s/p open ventral hernia repair, LOA x 1 hour, explantation of intraperitoneal mesh by Dr. Lyndel on 12/08/23 for Recurrent incarcerated ventral Hernia with small bowel obstruction  - ABL anemia post op with hypotension. S/p 1U PRBC POD 1. Hgb stable today. No external bleeding.  - Cont NPO and NGT to LIWS. AROBF. Will discuss with MD if okay to remove NGT and trial CLD - Would keep NPO with NGT to LIWS for now - Cont JP, bloody SS, 20cc/24 hours - Mobilize, pulm toilet  FEN - NPO, NGT to LIWS, IVF per primary  VTE - SCDs, held in setting of ABL anemia  ID - Zosyn  per primary for TOA. Does not need abx from our standpoint Foley - In place, continue today given low UOP in setting of AKI to monitor I/O.  AKI - Cr 0.74 --> 1.46 --> 4.03. UOP 177ml/24 hours. Toradol  stopped POD 1. Avoid nephrotoxic medications - message sent to pharmacy yesterday to review. Renal US  negative. IV Tylenol  and hydromorphone  for pain control. Likely needs abx renally dosed. Message sent to [facility] about mIVF. Strict I/O. Consider nephrology consult. Defer further w/u to primary team.  HTN - Per primary  TOA - Per GYN. Abx. They recommended 14d of abx and repeat ultrasound in 6-8wks during last admission    LOS: 5 days    Ozell CHRISTELLA Shaper, [facility] 12/10/2023, 10:11 AM Please see Amion for pager number during day hours 7:00am-4:30pm

## 2023-12-10 NOTE — TOC Initial Note (Signed)
 Transition of Care [facility]) - Initial/Assessment Note    Patient Details  Name: Victoria Chavez MRN: [id number] Date of Birth: 1980/11/09  Transition [facility]) CM/SW Contact:    Sudie Erminio Deems, RN Phone Number: 12/10/2023, 11:17 AM  Clinical Narrative: Patient transferred from [address] [place]. Patient presented for emesis-SBO-NG tube in place. Patient continues on IV Zosyn . PTA patient reports that she was from home alone. Patient states she has support of her sister and brothers. Patient works at [employer] and has insurance. Case Manager received a consult for medication assistance. Patient has insurance; therefore, Case Manager is unable to assist with co pay cost. Patient has transportation to get to appointments. No home needs identified at this time. Case Manager will continue to follow for disposition needs.   Expected Discharge Plan: Home/Self Care Barriers to Discharge: Continued Medical Work up   Patient Goals and CMS Choice Patient states their goals for this hospitalization and ongoing recovery are:: patient plans to return home once stable.   Choice offered to / list presented to : NA      Expected Discharge Plan and Services In-house Referral: NA Discharge Planning Services: CM Consult Post Acute Care Choice: NA Living arrangements for the past 2 months: Apartment                   DME Agency: NA       HH Arranged: NA          Prior Living Arrangements/Services Living arrangements for the past 2 months: Apartment Lives with:: Self Patient language and need for interpreter reviewed:: Yes Do you feel safe going back to the place where you live?: Yes      Need for Family Participation in Patient Care: No (Comment) Care giver support system in place?: No (comment)   Criminal Activity/Legal Involvement Pertinent to Current Situation/Hospitalization: No - Comment as needed  Activities of Daily Living   ADL Screening (condition at time of  admission) Independently performs ADLs?: Yes (appropriate for developmental age) Is the patient deaf or have difficulty hearing?: No Does the patient have difficulty seeing, even when wearing glasses/contacts?: No Does the patient have difficulty concentrating, remembering, or making decisions?: No  Permission Sought/Granted Permission sought to share information with : Case Manager, Family Supports (has support of sister and brothers)                Emotional Assessment Appearance:: Appears stated age Attitude/Demeanor/Rapport: Engaged Affect (typically observed): Appropriate Orientation: : Oriented to Self, Oriented to Place, Oriented to  Time, Oriented to Situation Alcohol / Substance Use: Not Applicable Psych Involvement: No (comment)  Admission diagnosis:  SBO (small bowel obstruction) (HCC) [K56.609] Patient Active Problem List   Diagnosis Date Noted   SBO (small bowel obstruction) (HCC) 12/05/2023   Tubal ovarian abscess 11/22/2023   Sepsis (HCC) 11/22/2023   PID (acute pelvic inflammatory disease) 11/22/2023   Hypokalemia 11/22/2023   History of essential hypertension 11/22/2023   B12 deficiency 10/07/2023   Chronic iron  deficiency anemia 09/04/2023   Gonorrhea 10/28/2019   Ileus (HCC) 10/27/2019   Pelvic abscess in female 10/26/2019   Trichomoniasis 10/26/2019   Anemia 10/26/2019   Incarcerated umbilical hernia 08/12/2019   Morbid obesity (HCC) 02/18/2017   Ventral hernia 02/18/2017   Tobacco user 02/18/2017   PCP:  Ilah Crigler, MD Pharmacy:   [facility] [phone] - [place], [place] - [address] AT [facility] [address] [place] [address] Phone: [phone]  Fax: [phone]     Social Drivers of Health (SDOH) Social History: SDOH Screenings   Food Insecurity: Food Insecurity Present (12/05/2023)  Housing: High Risk (12/05/2023)  Transportation Needs: No Transportation Needs (12/05/2023)  Utilities: Not At  Risk (12/05/2023)  Depression (PHQ2-9): Low Risk  (12/03/2023)  Tobacco Use: Medium Risk (12/08/2023)   SDOH Interventions:     Readmission Risk Interventions    11/26/2023   12:01 PM  Readmission Risk Prevention Plan  Post Dischage Appt Complete  Medication Screening Complete  Transportation Screening Complete

## 2023-12-10 NOTE — Plan of Care (Signed)

## 2023-12-11 DIAGNOSIS — D62 Acute posthemorrhagic anemia: Secondary | ICD-10-CM | POA: Diagnosis not present

## 2023-12-11 DIAGNOSIS — I959 Hypotension, unspecified: Secondary | ICD-10-CM | POA: Diagnosis not present

## 2023-12-11 DIAGNOSIS — N179 Acute kidney failure, unspecified: Secondary | ICD-10-CM | POA: Diagnosis not present

## 2023-12-11 DIAGNOSIS — K56609 Unspecified intestinal obstruction, unspecified as to partial versus complete obstruction: Secondary | ICD-10-CM | POA: Diagnosis not present

## 2023-12-11 LAB — BASIC METABOLIC PANEL WITH GFR
Anion gap: 11 (ref 5–15)
BUN: 25 mg/dL — ABNORMAL HIGH (ref 6–20)
CO2: 23 mmol/L (ref 22–32)
Calcium: 8.1 mg/dL — ABNORMAL LOW (ref 8.9–10.3)
Chloride: 101 mmol/L (ref 98–111)
Creatinine, Ser: 2.57 mg/dL — ABNORMAL HIGH (ref 0.44–1.00)
GFR, Estimated: 23 mL/min — ABNORMAL LOW (ref >60)
Glucose, Bld: 87 mg/dL (ref 70–99)
Potassium: 3.4 mmol/L — ABNORMAL LOW (ref 3.5–5.1)
Sodium: 135 mmol/L (ref 135–145)

## 2023-12-11 LAB — CBC
HCT: 20.9 % — ABNORMAL LOW (ref 36.0–46.0)
Hemoglobin: 6.4 g/dL — CL (ref 12.0–15.0)
MCH: 24.2 pg — ABNORMAL LOW (ref 26.0–34.0)
MCHC: 30.6 g/dL (ref 30.0–36.0)
MCV: 78.9 fL — ABNORMAL LOW (ref 80.0–100.0)
Platelets: 501 K/uL — ABNORMAL HIGH (ref 150–400)
RBC: 2.65 MIL/uL — ABNORMAL LOW (ref 3.87–5.11)
RDW: 19.9 % — ABNORMAL HIGH (ref 11.5–15.5)
WBC: 16.3 K/uL — ABNORMAL HIGH (ref 4.0–10.5)
nRBC: 0 % (ref 0.0–0.2)

## 2023-12-11 LAB — PREPARE RBC (CROSSMATCH)

## 2023-12-11 MED ORDER — SODIUM CHLORIDE 0.9% IV SOLUTION: Freq: Once | INTRAVENOUS | Status: AC

## 2023-12-11 MED ORDER — POTASSIUM CHLORIDE CRYS ER 20 MEQ PO TBCR: 40.0000 meq | EXTENDED_RELEASE_TABLET | Freq: Once | ORAL | Status: DC

## 2023-12-11 MED ORDER — POTASSIUM CHLORIDE 10 MEQ/100ML IV SOLN
10.0000 meq | INTRAVENOUS | Status: DC
  Administered 2023-12-11 (×3): 10 meq via INTRAVENOUS
  Filled 2023-12-11 (×3): qty 100

## 2023-12-11 MED ORDER — POTASSIUM CHLORIDE CRYS ER 20 MEQ PO TBCR
40.0000 meq | EXTENDED_RELEASE_TABLET | Freq: Once | ORAL | Status: AC
  Administered 2023-12-11: 40 meq via ORAL
  Filled 2023-12-11: qty 2

## 2023-12-11 NOTE — Progress Notes (Signed)
 3 Days Post-Op  Subjective: CC: Patient reports mild pain around her incision that is worse after coughing. No nausea. NGT with 100cc/24 hours of bilious output. Passing flatus. No BM. Foley in place. Mobilizing.   Afebrile. Remains tachycardic. No hypotension. CBC pending. Cr downtrending. UOP improved.   Objective: Vital signs in last 24 hours: Temp:  [97.6 F (36.4 C)-98.7 F (37.1 C)] 98.5 F (36.9 C) (08/14 0753) Pulse Rate:  [100-117] 117 (08/14 0753) Resp:  [18-22] 18 (08/14 0753) BP: (111-128)/(67-87) 126/80 (08/14 0753) SpO2:  [94 %-98 %] 98 % (08/14 0753) Last BM Date : 12/08/23  Intake/Output from previous day: 08/13 0701 - 08/14 0700 In: [id number] [NG/GT:30; IV Piggyback:250100] Out: 1510 [Urine:1350; Emesis/NG output:100; Drains:60] Intake/Output this shift: No intake/output data recorded.  PE: Gen:  Alert, NAD, pleasant Pulm:  Rate and effort normal Abd: Soft, mild distension, mild ttp around her incision, otherwise NT, some BS. Midline wound with staples in place and no evidence of bleeding or drainage. There does appear to be some fluid under the incision on the lower 1/3. No overlying skin changes. JP drain bloody SS - 60cc/24 hours  GU: Foley in place with straw colored urine in foley bag  Psych: A&Ox3   Lab Results:  Recent Labs    12/09/23 1704 12/10/23 0409  WBC 12.5* 12.2*  HGB 8.4* 8.3*  HCT 27.1* 26.7*  PLT 557* 544*   BMET Recent Labs    12/10/23 1427 12/11/23 0507  NA 139 135  K 3.7 3.4*  CL 99 101  CO2 25 23  GLUCOSE 110* 87  BUN 28* 25*  CREATININE 3.77* 2.57*  CALCIUM 8.4* 8.1*   PT/INR No results for input(s): LABPROT, INR in the last 72 hours. CMP     Component Value Date/Time   NA 135 12/11/2023 0507   K 3.4 (L) 12/11/2023 0507   CL 101 12/11/2023 0507   CO2 23 12/11/2023 0507   GLUCOSE 87 12/11/2023 0507   BUN 25 (H) 12/11/2023 0507   CREATININE 2.57 (H) 12/11/2023 0507   CALCIUM 8.1 (L) 12/11/2023 0507    PROT 7.0 12/06/2023 0240   ALBUMIN  2.9 (L) 12/06/2023 0240   AST 12 (L) 12/06/2023 0240   ALT 12 12/06/2023 0240   ALKPHOS 68 12/06/2023 0240   BILITOT 0.2 12/06/2023 0240   GFRNONAA 23 (L) 12/11/2023 0507   GFRAA >60 10/28/2019 0422   Lipase     Component Value Date/Time   LIPASE 25 12/05/2023 1014    Studies/Results: US  RENAL Result Date: 12/09/2023 CLINICAL DATA:  Acute kidney injury EXAM: RENAL / URINARY TRACT ULTRASOUND COMPLETE COMPARISON:  CT 12/05/2023. FINDINGS: Right Kidney: Renal measurements: 12 x 5.9 x 5.3 cm = volume: 193.9 mL. Echogenicity within normal limits. No mass or hydronephrosis visualized. Left Kidney: Renal measurements: 9.9 x 4.4 x 4.9 cm = volume: 112.2 mL. Echogenicity within normal limits. No mass or hydronephrosis visualized. Bladder: Decompressed by Foley catheter Other: None. IMPRESSION: Negative renal ultrasound Electronically Signed   By: Luke Bun M.D.   On: 12/09/2023 16:16    Anti-infectives: Anti-infectives (From admission, onward)    Start     Dose/Rate Route Frequency Ordered Stop   12/05/23 1700  piperacillin -tazobactam (ZOSYN ) IVPB 3.375 g  Status:  Discontinued        3.375 g 12.5 mL/hr over 240 Minutes Intravenous Every 8 hours 12/05/23 1609 12/11/23 0930        Assessment/Plan POD 3 s/p open ventral hernia repair, LOA  x 1 hour, explantation of intraperitoneal mesh by Dr. Lyndel on 12/08/23 for Recurrent incarcerated ventral Hernia with small bowel obstruction  - ABL anemia post op with hypotension. S/p 1U PRBC POD 1. Hgb pending today. No external bleeding.  - Clamp NGT and allow CLD. Possible NGT removal this PM if toelrating NGT clamping and CLD.  - Will review midline incision with my attending to see if he wants me to probe incision vs monitor.  - Cont JP, bloody SS, 60cc/24 hours - Mobilize, pulm toilet  FEN - Clamp NGT, CLD, IVF per primary  VTE - SCDs, on held in setting of ABL anemia. Can likely restart tomorrow if  hgb stable ID - Zosyn  per primary for TOA. Does not need abx from our standpoint Foley - In place, okay to remove from our standpoint.   AKI - Improving. Avoid nephrotoxic medications  HTN - Per primary  TOA - Per GYN. Abx. They recommended 14d of abx and repeat ultrasound in 6-8wks during last admission    LOS: 6 days    Ozell CHRISTELLA Shaper, [facility] 12/11/2023, 10:57 AM Please see Amion for pager number during day hours 7:00am-4:30pm

## 2023-12-11 NOTE — Progress Notes (Signed)
 PROGRESS NOTE    Victoria Chavez  FMW:[id number] DOB: 10-Jul-1980 DOA: 12/05/2023 PCP: Ilah Crigler, MD   Brief Narrative: Victoria Chavez is a 43 y.o. female with a history of hypertension, morbid obesity, sleep apnea, iron  deficiency anemia, ventral hernia status post mesh repair, left tubo-ovarian abscess.  Patient presented secondary to emesis and abdominal pain was found to have evidence of high-grade small bowel obstruction secondary to known ventral hernia.  General surgery was consulted and performed an open repair.  Hospitalization complicated by development of AKI.   Assessment and Plan:  High grade small bowel obstruction Patient with associated nausea, vomiting, abdominal pain.  Bowel obstruction diagnosed on CT imaging with evidence of a transition point within the large inferior ventral hernia.  General surgery was consulted and performed an open ventral hernia repair with lysis of adhesions and explantation of intraperitoneal mesh on 8/11. - General Surgery recommendations: Pending today but per patient, plan to remove NG tube  AKI Baseline creatinine of 0.7. Severe worsening of renal function likely secondary to ATN from hypotension. Creatinine up to a peak of 4.03. Renal ultrasound obtained on 8/12 significant for no abnormalities noted.  Urine output has improved significantly with documented 1,350 mL over the last 24 hours. Creatinine down to 2.57 today. -Strict in/out -Daily BMP -IV fluids -Watch for post-ATN diuresis  Acute blood loss anemia Chronic iron  deficiency anemia Baseline hemoglobin of 10-11. Hemoglobin of 12.2 on admission which immediately decreased to 11. Postoperatively, hemoglobin down to 8.3. Patient with perioperative blood loss documented at 100 mL. Patient received 1 unit of PRBC on 8/12. Post-transfusion hemoglobin of 8.3 and has remained stable.  Hypotension Presumed secondary to blood loss anemia and exacerbated by antihypertensive  medications. Blood pressure improved.  Hypokalemia -potassium supplementation  Sinus tachycardia Stable. Asymptomatic.  Thrombocytosis Likely reactive. Improving.  Primary hypertension Patient is managed on amlodipine  and spironolactone  as an outpatient which are held secondary to hypotension.  History of tuboovarian abscess Present on admission and diagnosed on prior admission. Patient started on IV antibiotics at that time and transitioned to oral Augmentin  to complete a two-week course. Course completed at this time. Gynecology recommendations for repeat pelvic ultrasound in 6-8 weeks from diagnosis. - Discontinue Zosyn   Presyncope Occurred during admission. Presumed secondary to hypotension from perioperative blood loss.  Obesity, class III Estimated body mass index is 46.31 kg/m as calculated from the following:   Height as of this encounter: 5' 7 (1.702 m).   Weight as of this encounter: 134.1 kg.   DVT prophylaxis: SCDs Code Status:   Code Status: Full Code Family Communication: None at bedside Disposition Plan: Discharge home likely in several days pending improvement of AKI in addition to ongoing general surgery recommendations/management   Consultants:  General surgery  Procedures:  General surgery Open ventral hernia repair  Lysis of adhesions Explantation of intraperitoneal mesh  Antimicrobials: Zosyn  IV   Subjective: Feels well today. No significant abdominal pain. Passing gas. No bowel movement.  Objective: BP 126/80 (BP Location: Right Arm)   Pulse (!) 117   Temp 98.5 F (36.9 C) (Oral)   Resp 18   Ht 5' 7 (1.702 m)   Wt 134.1 kg   LMP 11/10/2023 (Exact Date)   SpO2 98%   BMI 46.31 kg/m   Examination:  General exam: Appears calm and comfortable Respiratory system: Clear to auscultation. Respiratory effort normal. Cardiovascular system: S1 & S2 heard, fast rate, normal rhythm. Gastrointestinal system: Abdomen is nondistended, soft  and  nontender. Normal bowel sounds heard. Central nervous system: Alert and oriented. No focal neurological deficits. Psychiatry: Judgement and insight appear normal. Mood & affect appropriate.    Data Reviewed: I have personally reviewed following labs and imaging studies  CBC Lab Results  Component Value Date   WBC 12.2 (H) 12/10/2023   RBC 3.52 (L) 12/10/2023   HGB 8.3 (L) 12/10/2023   HCT 26.7 (L) 12/10/2023   MCV 75.9 (L) 12/10/2023   MCH 23.6 (L) 12/10/2023   PLT 544 (H) 12/10/2023   MCHC 31.1 12/10/2023   RDW 19.7 (H) 12/10/2023   LYMPHSABS 1.0 12/09/2023   MONOABS 2.0 (H) 12/09/2023   EOSABS 0.0 12/09/2023   BASOSABS 0.0 12/09/2023     Last metabolic panel Lab Results  Component Value Date   NA 135 12/11/2023   K 3.4 (L) 12/11/2023   CL 101 12/11/2023   CO2 23 12/11/2023   BUN 25 (H) 12/11/2023   CREATININE 2.57 (H) 12/11/2023   GLUCOSE 87 12/11/2023   GFRNONAA 23 (L) 12/11/2023   GFRAA >60 10/28/2019   CALCIUM 8.1 (L) 12/11/2023   PROT 7.0 12/06/2023   ALBUMIN  2.9 (L) 12/06/2023   BILITOT 0.2 12/06/2023   ALKPHOS 68 12/06/2023   AST 12 (L) 12/06/2023   ALT 12 12/06/2023   ANIONGAP 11 12/11/2023    GFR: Estimated Creatinine Clearance: 40.4 mL/min (A) (by C-G formula based on SCr of 2.57 mg/dL (H)).  No results found for this or any previous visit (from the past 240 hours).    Radiology Studies: US  RENAL Result Date: 12/09/2023 CLINICAL DATA:  Acute kidney injury EXAM: RENAL / URINARY TRACT ULTRASOUND COMPLETE COMPARISON:  CT 12/05/2023. FINDINGS: Right Kidney: Renal measurements: 12 x 5.9 x 5.3 cm = volume: 193.9 mL. Echogenicity within normal limits. No mass or hydronephrosis visualized. Left Kidney: Renal measurements: 9.9 x 4.4 x 4.9 cm = volume: 112.2 mL. Echogenicity within normal limits. No mass or hydronephrosis visualized. Bladder: Decompressed by Foley catheter Other: None. IMPRESSION: Negative renal ultrasound Electronically Signed   By: Luke Bun M.D.   On: 12/09/2023 16:16      LOS: 6 days    Elgin Lam, MD [facility] 12/11/2023, 9:28 AM   If 7PM-7AM, please contact night-coverage [email or web address]

## 2023-12-11 NOTE — Plan of Care (Incomplete)
   Problem: Education: Goal: Knowledge of General Education information will improve Description: Including pain rating scale, medication(s)/side effects and non-pharmacologic comfort measures Outcome: Progressing   Problem: Clinical Measurements: Goal: Will remain free from infection Outcome: Progressing

## 2023-12-11 NOTE — Progress Notes (Signed)
 Patient JP drain. Blood is clotted off in drain. Attempted to strip drain. However attempt was unsuccessful. [employer] MD advised to strip line. Will attempt again.

## 2023-12-11 NOTE — Progress Notes (Signed)
 JP line stripped and dressing changed on midline incision and JP drain

## 2023-12-12 DIAGNOSIS — K56609 Unspecified intestinal obstruction, unspecified as to partial versus complete obstruction: Secondary | ICD-10-CM | POA: Diagnosis not present

## 2023-12-12 DIAGNOSIS — I959 Hypotension, unspecified: Secondary | ICD-10-CM | POA: Diagnosis not present

## 2023-12-12 DIAGNOSIS — N179 Acute kidney failure, unspecified: Secondary | ICD-10-CM | POA: Diagnosis not present

## 2023-12-12 DIAGNOSIS — D62 Acute posthemorrhagic anemia: Secondary | ICD-10-CM | POA: Diagnosis not present

## 2023-12-12 LAB — CBC
HCT: 23.8 % — ABNORMAL LOW (ref 36.0–46.0)
HCT: 22.4 % — ABNORMAL LOW (ref 36.0–46.0)
Hemoglobin: 7.4 g/dL — ABNORMAL LOW (ref 12.0–15.0)
Hemoglobin: 7 g/dL — ABNORMAL LOW (ref 12.0–15.0)
MCH: 24.3 pg — ABNORMAL LOW (ref 26.0–34.0)
MCH: 24.6 pg — ABNORMAL LOW (ref 26.0–34.0)
MCHC: 31.1 g/dL (ref 30.0–36.0)
MCHC: 31.3 g/dL (ref 30.0–36.0)
MCV: 78 fL — ABNORMAL LOW (ref 80.0–100.0)
MCV: 78.9 fL — ABNORMAL LOW (ref 80.0–100.0)
Platelets: 406 K/uL — ABNORMAL HIGH (ref 150–400)
Platelets: 419 K/uL — ABNORMAL HIGH (ref 150–400)
RBC: 3.05 MIL/uL — ABNORMAL LOW (ref 3.87–5.11)
RBC: 2.84 MIL/uL — ABNORMAL LOW (ref 3.87–5.11)
RDW: 19 % — ABNORMAL HIGH (ref 11.5–15.5)
RDW: 19.1 % — ABNORMAL HIGH (ref 11.5–15.5)
WBC: 10.6 K/uL — ABNORMAL HIGH (ref 4.0–10.5)
WBC: 11.2 K/uL — ABNORMAL HIGH (ref 4.0–10.5)
nRBC: 0.5 % — ABNORMAL HIGH (ref 0.0–0.2)
nRBC: 0.3 % — ABNORMAL HIGH (ref 0.0–0.2)

## 2023-12-12 LAB — BASIC METABOLIC PANEL WITH GFR
Anion gap: 11 (ref 5–15)
BUN: 21 mg/dL — ABNORMAL HIGH (ref 6–20)
CO2: 25 mmol/L (ref 22–32)
Calcium: 8.3 mg/dL — ABNORMAL LOW (ref 8.9–10.3)
Chloride: 101 mmol/L (ref 98–111)
Creatinine, Ser: 1.58 mg/dL — ABNORMAL HIGH (ref 0.44–1.00)
GFR, Estimated: 41 mL/min — ABNORMAL LOW (ref >60)
Glucose, Bld: 135 mg/dL — ABNORMAL HIGH (ref 70–99)
Potassium: 3.7 mmol/L (ref 3.5–5.1)
Sodium: 137 mmol/L (ref 135–145)

## 2023-12-12 LAB — ALPHA-THALASSEMIA ANALYSIS: IMAGE: 0

## 2023-12-12 LAB — TYPE AND SCREEN
ABO/RH(D): O POS
Antibody Screen: NEGATIVE
Unit division: 0
Unit division: 0
Unit division: 0

## 2023-12-12 LAB — BPAM RBC
Blood Product Expiration Date: 202509042359
Blood Product Expiration Date: 202509112359
Blood Product Expiration Date: 202509112359
ISSUE DATE / TIME: 202508121110
ISSUE DATE / TIME: 202508141759
ISSUE DATE / TIME: 202508142317
Unit Type and Rh: 5100
Unit Type and Rh: 5100
Unit Type and Rh: 5100

## 2023-12-12 LAB — HEMOGLOBIN AND HEMATOCRIT, BLOOD
HCT: 23.4 % — ABNORMAL LOW (ref 36.0–46.0)
Hemoglobin: 7.2 g/dL — ABNORMAL LOW (ref 12.0–15.0)

## 2023-12-12 MED ORDER — ACETAMINOPHEN 325 MG PO TABS
650.0000 mg | ORAL_TABLET | ORAL | Status: DC | PRN
  Administered 2023-12-12 – 2023-12-15 (×4): 650 mg via ORAL
  Filled 2023-12-12 (×4): qty 2

## 2023-12-12 NOTE — Progress Notes (Signed)
 Foley catheter removed per order at 12:50pm.

## 2023-12-12 NOTE — Progress Notes (Signed)
 PROGRESS NOTE    Victoria Chavez  FMW:[id number] DOB: 29-Sep-1980 DOA: 12/05/2023 PCP: Ilah Crigler, MD   Brief Narrative: Victoria Chavez is a 43 y.o. female with a history of hypertension, morbid obesity, sleep apnea, iron  deficiency anemia, ventral hernia status post mesh repair, left tubo-ovarian abscess.  Patient presented secondary to emesis and abdominal pain was found to have evidence of high-grade small bowel obstruction secondary to known ventral hernia.  General surgery was consulted and performed an open repair.  Hospitalization complicated by development of AKI.   Assessment and Plan:  High grade small bowel obstruction Patient with associated nausea, vomiting, abdominal pain.  Bowel obstruction diagnosed on CT imaging with evidence of a transition point within the large inferior ventral hernia.  General surgery was consulted and performed an open ventral hernia repair with lysis of adhesions and explantation of intraperitoneal mesh on 8/11. NG tube removed on 8/14. - General Surgery recommendations: NPO, repeat CT in AM  AKI Baseline creatinine of 0.7. Severe worsening of renal function likely secondary to ATN from hypotension. Creatinine up to a peak of 4.03. Renal ultrasound obtained on 8/12 significant for no abnormalities noted.  Urine output has improved significantly with documented 1,350 mL over the last 24 hours. Creatinine down to 1.58 today. -Strict in/out -Daily BMP -IV fluids -Watch for post-ATN diuresis -Discontinue foley catheter  Acute blood loss anemia Chronic iron  deficiency anemia Baseline hemoglobin of 10-11. Hemoglobin of 12.2 on admission which immediately decreased to 11. Postoperatively, hemoglobin down to 8.3. Patient with perioperative blood loss documented at 100 mL. Patient received 1 unit of PRBC on 8/12 and required another 2 units of PRBC on 8/14. Concern for possible hematoma, per general surgery. -Trend CBC/H&H  Hypotension Presumed  secondary to blood loss anemia and exacerbated by antihypertensive medications. Blood pressure improved.  Hypokalemia Potassium supplementation as needed  Sinus tachycardia Stable. Asymptomatic. Improved after blood transfusion.  Thrombocytosis Likely reactive. Improving.  Primary hypertension Patient is managed on amlodipine  and spironolactone  as an outpatient which are held secondary to hypotension.  History of tuboovarian abscess Present on admission and diagnosed on prior admission. Patient started on IV antibiotics at that time and transitioned to oral Augmentin  to complete a two-week course. Course completed at this time. Gynecology recommendations for repeat pelvic ultrasound in 6-8 weeks from diagnosis.  Presyncope Occurred during admission. Presumed secondary to hypotension from perioperative blood loss.  Obesity, class III Estimated body mass index is 46.31 kg/m as calculated from the following:   Height as of this encounter: 5' 7 (1.702 m).   Weight as of this encounter: 134.1 kg.   DVT prophylaxis: SCDs Code Status:   Code Status: Full Code Family Communication: None at bedside Disposition Plan: Discharge home likely in several days pending improvement of AKI in addition to ongoing general surgery recommendations/management   Consultants:  General surgery  Procedures:  General surgery Open ventral hernia repair  Lysis of adhesions Explantation of intraperitoneal mesh  Antimicrobials: Zosyn  IV   Subjective: No issues overnight. No bowel movement. Passing gas.  Objective: BP 125/83 (BP Location: Left Arm)   Pulse (!) 104   Temp 98.6 F (37 C) (Oral)   Resp 18   Ht 5' 7 (1.702 m)   Wt 134.1 kg   LMP 11/10/2023 (Exact Date)   SpO2 98%   BMI 46.31 kg/m   Examination:  General exam: Appears calm and comfortable Respiratory system: Clear to auscultation. Respiratory effort normal. Cardiovascular system: S1 & S2 heard,  fast rate, normal  rhythm. Gastrointestinal system: Abdomen is nondistended, soft and nontender. Normal bowel sounds heard. Central nervous system: Alert and oriented. No focal neurological deficits. Psychiatry: Judgement and insight appear normal. Mood & affect appropriate.    Data Reviewed: I have personally reviewed following labs and imaging studies  CBC Lab Results  Component Value Date   WBC 10.6 (H) 12/12/2023   RBC 3.05 (L) 12/12/2023   HGB 7.4 (L) 12/12/2023   HCT 23.8 (L) 12/12/2023   MCV 78.0 (L) 12/12/2023   MCH 24.3 (L) 12/12/2023   PLT 406 (H) 12/12/2023   MCHC 31.1 12/12/2023   RDW 19.0 (H) 12/12/2023   LYMPHSABS 1.0 12/09/2023   MONOABS 2.0 (H) 12/09/2023   EOSABS 0.0 12/09/2023   BASOSABS 0.0 12/09/2023     Last metabolic panel Lab Results  Component Value Date   NA 137 12/12/2023   K 3.7 12/12/2023   CL 101 12/12/2023   CO2 25 12/12/2023   BUN 21 (H) 12/12/2023   CREATININE 1.58 (H) 12/12/2023   GLUCOSE 135 (H) 12/12/2023   GFRNONAA 41 (L) 12/12/2023   GFRAA >60 10/28/2019   CALCIUM 8.3 (L) 12/12/2023   PROT 7.0 12/06/2023   ALBUMIN  2.9 (L) 12/06/2023   BILITOT 0.2 12/06/2023   ALKPHOS 68 12/06/2023   AST 12 (L) 12/06/2023   ALT 12 12/06/2023   ANIONGAP 11 12/12/2023    GFR: Estimated Creatinine Clearance: 65.7 mL/min (A) (by C-G formula based on SCr of 1.58 mg/dL (H)).  No results found for this or any previous visit (from the past 240 hours).    Radiology Studies: No results found.     LOS: 7 days    Elgin Lam, MD [facility] 12/12/2023, 12:16 PM   If 7PM-7AM, please contact night-coverage [email or web address]

## 2023-12-12 NOTE — Progress Notes (Signed)
 4 Days Post-Op  Subjective: CC: Patient reports mild pain around her incision. Otherwise no abdominal pain. Tolerating CLD without n/v. Passing flatus. No BM. Foley in place - had to be irrigated this morning and now with 850cc out.   Afebrile. Remains tachycardic but improved in the low 100's today. No hypotension. Hgb 7.4 from 6.4 after 2U PRBC. 2U FFP still pending to be given. Cr downtrending.   Objective: Vital signs in last 24 hours: Temp:  [98.1 F (36.7 C)-99.6 F (37.6 C)] 98.6 F (37 C) (08/15 0834) Pulse Rate:  [104-137] 104 (08/15 0534) Resp:  [18-27] 18 (08/15 0534) BP: (118-152)/(70-90) 125/83 (08/15 0534) SpO2:  [94 %-98 %] 98 % (08/15 0534) Last BM Date : 12/08/23  Intake/Output from previous day: 08/14 0701 - 08/15 0700 In: 2797.3 [I.V.:1701.3; Blood:746; IV Piggyback:350] Out: 350 [Urine:350] Intake/Output this shift: Total I/O In: -  Out: 850 [Urine:850]  PE: Gen:  Alert, NAD, pleasant Pulm:  Rate and effort normal Abd: Soft, mild distension, mild ttp around her incision, otherwise NT, some BS. Midline wound with staples in place and no evidence of bleeding or drainage. There does appear to be some fluid under the incision on the lower 1/3. No overlying skin changes. JP drain bloody SS with some clot in the bulb, no fluid in tubing   GU: Foley in place with straw colored urine in foley bag  Psych: A&Ox3   Lab Results:  Recent Labs    12/11/23 1445 12/12/23 0530  WBC 16.3* 10.6*  HGB 6.4* 7.4*  HCT 20.9* 23.8*  PLT 501* 406*   BMET Recent Labs    12/11/23 0507 12/12/23 0530  NA 135 137  K 3.4* 3.7  CL 101 101  CO2 23 25  GLUCOSE 87 135*  BUN 25* 21*  CREATININE 2.57* 1.58*  CALCIUM 8.1* 8.3*   PT/INR No results for input(s): LABPROT, INR in the last 72 hours. CMP     Component Value Date/Time   NA 137 12/12/2023 0530   K 3.7 12/12/2023 0530   CL 101 12/12/2023 0530   CO2 25 12/12/2023 0530   GLUCOSE 135 (H) 12/12/2023  0530   BUN 21 (H) 12/12/2023 0530   CREATININE 1.58 (H) 12/12/2023 0530   CALCIUM 8.3 (L) 12/12/2023 0530   PROT 7.0 12/06/2023 0240   ALBUMIN  2.9 (L) 12/06/2023 0240   AST 12 (L) 12/06/2023 0240   ALT 12 12/06/2023 0240   ALKPHOS 68 12/06/2023 0240   BILITOT 0.2 12/06/2023 0240   GFRNONAA 41 (L) 12/12/2023 0530   GFRAA >60 10/28/2019 0422   Lipase     Component Value Date/Time   LIPASE 25 12/05/2023 1014    Studies/Results: No results found.   Anti-infectives: Anti-infectives (From admission, onward)    Start     Dose/Rate Route Frequency Ordered Stop   12/05/23 1700  piperacillin -tazobactam (ZOSYN ) IVPB 3.375 g  Status:  Discontinued        3.375 g 12.5 mL/hr over 240 Minutes Intravenous Every 8 hours 12/05/23 1609 12/11/23 0930        Assessment/Plan POD 4 s/p open ventral hernia repair, LOA x 1 hour, explantation of intraperitoneal mesh by Dr. Lyndel on 12/08/23 for Recurrent incarcerated ventral Hernia with small bowel obstruction  - ABL anemia post op with tachycardia and hypotension. Hypotension resolved. - S/p 1U PRBC POD 1 and 2U PRBC POD 3. 2U FFP pending to be given. - Hgb 7.4 this AM from 6.4 after 2U 8/14 -  Given this is not the response I would expect after 2U, will recheck CBC in the PM and in the AM.  There is no signs of external bleeding at this time. On exam, I question if there is a hematoma under the inferior aspect of incision in the subq. Reviewed with attending, will hold on probing or removing the staples to as there is no signs of cellulitis and to promote any oozing to tamponade. If patient continues to have ongoing ABL anemia, any worsening hemodynamic changes or concerning clinical changes moving forward, will plan to check CT A/P. I think we can hold off at this time.  - Cont JP drain  - Adv to FLD - Mobilize, pulm toilet  FEN - FLD, IVF per primary  VTE - SCDs, on held in setting of ABL anemia.  ID - Zosyn  per primary for TOA. Does  not need abx from our standpoint Foley - In place, okay to remove from our standpoint.   AKI - Improving. Avoid nephrotoxic medications. HTN - Per primary  TOA - Per GYN. Abx. They recommended 14d of abx and repeat ultrasound in 6-8wks during last admission    LOS: 7 days    Victoria Chavez, [facility] 12/12/2023, 11:02 AM Please see Amion for pager number during day hours 7:00am-4:30pm

## 2023-12-13 DIAGNOSIS — N179 Acute kidney failure, unspecified: Secondary | ICD-10-CM | POA: Diagnosis not present

## 2023-12-13 DIAGNOSIS — D62 Acute posthemorrhagic anemia: Secondary | ICD-10-CM | POA: Diagnosis not present

## 2023-12-13 DIAGNOSIS — K56609 Unspecified intestinal obstruction, unspecified as to partial versus complete obstruction: Secondary | ICD-10-CM | POA: Diagnosis not present

## 2023-12-13 DIAGNOSIS — I959 Hypotension, unspecified: Secondary | ICD-10-CM | POA: Diagnosis not present

## 2023-12-13 LAB — MAGNESIUM: Magnesium: 1.9 mg/dL (ref 1.7–2.4)

## 2023-12-13 LAB — CBC
HCT: 23.9 % — ABNORMAL LOW (ref 36.0–46.0)
Hemoglobin: 7.3 g/dL — ABNORMAL LOW (ref 12.0–15.0)
MCH: 24.3 pg — ABNORMAL LOW (ref 26.0–34.0)
MCHC: 30.5 g/dL (ref 30.0–36.0)
MCV: 79.4 fL — ABNORMAL LOW (ref 80.0–100.0)
Platelets: 408 K/uL — ABNORMAL HIGH (ref 150–400)
RBC: 3.01 MIL/uL — ABNORMAL LOW (ref 3.87–5.11)
RDW: 19.1 % — ABNORMAL HIGH (ref 11.5–15.5)
WBC: 10.5 K/uL (ref 4.0–10.5)
nRBC: 0.3 % — ABNORMAL HIGH (ref 0.0–0.2)

## 2023-12-13 LAB — BASIC METABOLIC PANEL WITH GFR
Anion gap: 11 (ref 5–15)
BUN: 12 mg/dL (ref 6–20)
CO2: 23 mmol/L (ref 22–32)
Calcium: 8.5 mg/dL — ABNORMAL LOW (ref 8.9–10.3)
Chloride: 101 mmol/L (ref 98–111)
Creatinine, Ser: 0.99 mg/dL (ref 0.44–1.00)
GFR, Estimated: >60 mL/min (ref >60)
Glucose, Bld: 118 mg/dL — ABNORMAL HIGH (ref 70–99)
Potassium: 3 mmol/L — ABNORMAL LOW (ref 3.5–5.1)
Sodium: 135 mmol/L (ref 135–145)

## 2023-12-13 MED ORDER — POTASSIUM CHLORIDE 10 MEQ/100ML IV SOLN
10.0000 meq | Freq: Once | INTRAVENOUS | Status: AC
  Administered 2023-12-13: 10 meq via INTRAVENOUS
  Filled 2023-12-13: qty 100

## 2023-12-13 MED ORDER — POTASSIUM CHLORIDE 20 MEQ PO PACK
40.0000 meq | PACK | Freq: Once | ORAL | Status: AC
  Administered 2023-12-13: 40 meq via ORAL
  Filled 2023-12-13: qty 2

## 2023-12-13 NOTE — Progress Notes (Signed)
    Assessment & Plan: POD#4 - s/p open VHR, LOA x 1 hour, explantation of mesh - Dr. Lyndel 12/08/23 - Hgb stable at 7.3 - Cont JP drain  - Adv to soft diet - Mobilize, pulm toilet - midline dressing changed with nurse - honeycomb   FEN - soft, IVF per primary  VTE - SCDs ID - Zosyn  per primary for TOA. Does not need abx from our standpoint   AKI - Improving. Avoid nephrotoxic medications. HTN - Per primary  TOA - Per GYN. Abx. They recommended 14d of abx and repeat ultrasound in 6-8wks during last admission        Krystal Spinner, MD [facility] A [facility] practice Office: [phone]        Chief Complaint: VH with SBO  Subjective: Patient in bed, comfortable.  Having loose BM's.  No sign of bleeding.  Objective: Vital signs in last 24 hours: Temp:  [97.5 F (36.4 C)-98.7 F (37.1 C)] 98.1 F (36.7 C) (08/16 0903) Pulse Rate:  [96-114] 114 (08/16 0903) Resp:  [16-21] 16 (08/16 0903) BP: (140-155)/(84-96) 141/89 (08/16 0903) SpO2:  [94 %-100 %] 98 % (08/16 0903) Last BM Date : 12/12/23  Intake/Output from previous day: 08/15 0701 - 08/16 0700 In: 480 [P.O.:480] Out: 1160 [Urine:1150; Drains:10] Intake/Output this shift: No intake/output data recorded.  Physical Exam: HEENT - sclerae clear, mucous membranes moist Abdomen - soft, obese; wound dry and intact, drsg changed; JP with small serosanguinous output  Lab Results:  Recent Labs    12/12/23 1444 12/12/23 1743 12/13/23 0452  WBC 11.2*  --  10.5  HGB 7.0* 7.2* 7.3*  HCT 22.4* 23.4* 23.9*  PLT 419*  --  408*   BMET Recent Labs    12/12/23 0530 12/13/23 0452  NA 137 135  K 3.7 3.0*  CL 101 101  CO2 25 23  GLUCOSE 135* 118*  BUN 21* 12  CREATININE 1.58* 0.99  CALCIUM 8.3* 8.5*   PT/INR No results for input(s): LABPROT, INR in the last 72 hours. Comprehensive Metabolic Panel:    Component Value Date/Time   NA 135 12/13/2023 0452   NA 137 12/12/2023 0530   K 3.0  (L) 12/13/2023 0452   K 3.7 12/12/2023 0530   CL 101 12/13/2023 0452   CL 101 12/12/2023 0530   CO2 23 12/13/2023 0452   CO2 25 12/12/2023 0530   BUN 12 12/13/2023 0452   BUN 21 (H) 12/12/2023 0530   CREATININE 0.99 12/13/2023 0452   CREATININE 1.58 (H) 12/12/2023 0530   GLUCOSE 118 (H) 12/13/2023 0452   GLUCOSE 135 (H) 12/12/2023 0530   CALCIUM 8.5 (L) 12/13/2023 0452   CALCIUM 8.3 (L) 12/12/2023 0530   AST 12 (L) 12/06/2023 0240   AST 17 12/05/2023 1014   ALT 12 12/06/2023 0240   ALT 14 12/05/2023 1014   ALKPHOS 68 12/06/2023 0240   ALKPHOS 81 12/05/2023 1014   BILITOT 0.2 12/06/2023 0240   BILITOT 0.2 12/05/2023 1014   PROT 7.0 12/06/2023 0240   PROT 8.1 12/05/2023 1014   ALBUMIN  2.9 (L) 12/06/2023 0240   ALBUMIN  3.4 (L) 12/05/2023 1014    Studies/Results: No results found.    Krystal Spinner 12/13/2023  Patient ID: Victoria Chavez, female   DOB: 17-Oct-1980, 43 y.o.   MRN: [id number]

## 2023-12-13 NOTE — Plan of Care (Signed)

## 2023-12-13 NOTE — Progress Notes (Signed)
 Patient ambulated in hallway with front wheeled walker

## 2023-12-13 NOTE — Progress Notes (Signed)
 Patient ambulating in hallway with front wheel walker. Patient denies chest pain and SOB.

## 2023-12-13 NOTE — Progress Notes (Signed)
 PROGRESS NOTE    Victoria Chavez  FMW:[id number] DOB: [birth year] DOA: 12/05/2023 PCP: Ilah Crigler, MD   Brief Narrative: Victoria Chavez is a 43 y.o. female with a history of hypertension, morbid obesity, sleep apnea, iron  deficiency anemia, ventral hernia status post mesh repair, left tubo-ovarian abscess.  Patient presented secondary to emesis and abdominal pain was found to have evidence of high-grade small bowel obstruction secondary to known ventral hernia.  General surgery was consulted and performed an open repair.  Hospitalization complicated by development of AKI and postoperative anemia.   Assessment and Plan:  High grade small bowel obstruction Patient with associated nausea, vomiting, abdominal pain.  Bowel obstruction diagnosed on CT imaging with evidence of a transition point within the large inferior ventral hernia.  General surgery was consulted and performed an open ventral hernia repair with lysis of adhesions and explantation of intraperitoneal mesh on 8/11. NG tube removed on 8/14. - General Surgery recommendations: Soft diet, continue JP drain  AKI Baseline creatinine of 0.7. Severe worsening of renal function likely secondary to ATN from hypotension. Creatinine up to a peak of 4.03. Renal ultrasound obtained on 8/12 significant for no abnormalities noted.  Urine output has improved significantly with documented 1,350 mL over the last 24 hours. Creatinine down to 0.99. AKI resolved. -Strict in/out -Daily BMP  Acute blood loss anemia Chronic iron  deficiency anemia Baseline hemoglobin of 10-11. Hemoglobin of 12.2 on admission which immediately decreased to 11. Postoperatively, hemoglobin down to 8.3. Patient with perioperative blood loss documented at 100 mL. Patient received 1 unit of PRBC on 8/12 and required another 2 units of PRBC on 8/14. Concern for possible hematoma, per general surgery. -Trend CBC/H&H  Hypotension Presumed secondary to blood loss anemia  and exacerbated by antihypertensive medications. Blood pressure improved.  Hypokalemia Potassium supplementation as needed  Sinus tachycardia Stable. Asymptomatic. Improved after blood transfusion.  Thrombocytosis Likely reactive. Improving.  Primary hypertension Patient is managed on amlodipine  and spironolactone  as an outpatient which are held secondary to hypotension.  History of tuboovarian abscess Present on admission and diagnosed on prior admission. Patient started on IV antibiotics at that time and transitioned to oral Augmentin  to complete a two-week course. Course completed at this time. Gynecology recommendations for repeat pelvic ultrasound in 6-8 weeks from diagnosis.  Presyncope Occurred during admission. Presumed secondary to hypotension from perioperative blood loss.  Obesity, class III Estimated body mass index is 46.31 kg/m as calculated from the following:   Height as of this encounter: 5' 7 (1.702 m).   Weight as of this encounter: 134.1 kg.   DVT prophylaxis: SCDs Code Status:   Code Status: Full Code Family Communication: None at bedside Disposition Plan: Discharge home likely in 2-3 days pending ongoing general surgery recommendations/management and stable hemoglobin   Consultants:  General surgery  Procedures:  General surgery Open ventral hernia repair  Lysis of adhesions Explantation of intraperitoneal mesh  Antimicrobials: Zosyn  IV   Subjective: Multiple bowel movements. Feeling well. Hopeful to go home soon.  Objective: BP 136/79 (BP Location: Left Arm)   Pulse (!) 105   Temp 98.9 F (37.2 C) (Oral)   Resp 16   Ht 5' 7 (1.702 m)   Wt 134.1 kg   LMP 11/10/2023 (Exact Date)   SpO2 98%   BMI 46.31 kg/m   Examination:  General exam: Appears calm and comfortable Respiratory system: Clear to auscultation. Respiratory effort normal. Cardiovascular system: S1 & S2 heard, RRR. No murmurs. Gastrointestinal system: Abdomen  is  nondistended, soft and nontender. Normal bowel sounds heard. Central nervous system: Alert and oriented. No focal neurological deficits. Psychiatry: Judgement and insight appear normal. Mood & affect appropriate.    Data Reviewed: I have personally reviewed following labs and imaging studies  CBC Lab Results  Component Value Date   WBC 10.5 12/13/2023   RBC 3.01 (L) 12/13/2023   HGB 7.3 (L) 12/13/2023   HCT 23.9 (L) 12/13/2023   MCV 79.4 (L) 12/13/2023   MCH 24.3 (L) 12/13/2023   PLT 408 (H) 12/13/2023   MCHC 30.5 12/13/2023   RDW 19.1 (H) 12/13/2023   LYMPHSABS 1.0 12/09/2023   MONOABS 2.0 (H) 12/09/2023   EOSABS 0.0 12/09/2023   BASOSABS 0.0 12/09/2023     Last metabolic panel Lab Results  Component Value Date   NA 135 12/13/2023   K 3.0 (L) 12/13/2023   CL 101 12/13/2023   CO2 23 12/13/2023   BUN 12 12/13/2023   CREATININE 0.99 12/13/2023   GLUCOSE 118 (H) 12/13/2023   GFRNONAA >60 12/13/2023   GFRAA >60 10/28/2019   CALCIUM 8.5 (L) 12/13/2023   PROT 7.0 12/06/2023   ALBUMIN  2.9 (L) 12/06/2023   BILITOT 0.2 12/06/2023   ALKPHOS 68 12/06/2023   AST 12 (L) 12/06/2023   ALT 12 12/06/2023   ANIONGAP 11 12/13/2023    GFR: Estimated Creatinine Clearance: 104.8 mL/min (by C-G formula based on SCr of 0.99 mg/dL).  No results found for this or any previous visit (from the past 240 hours).    Radiology Studies: No results found.     LOS: 8 days    Elgin Lam, MD [facility] 12/13/2023, 1:56 PM   If 7PM-7AM, please contact night-coverage [email or web address]

## 2023-12-14 ENCOUNTER — Encounter: Payer: Self-pay | Admitting: Hematology

## 2023-12-14 DIAGNOSIS — N179 Acute kidney failure, unspecified: Secondary | ICD-10-CM | POA: Diagnosis not present

## 2023-12-14 DIAGNOSIS — K56609 Unspecified intestinal obstruction, unspecified as to partial versus complete obstruction: Secondary | ICD-10-CM | POA: Diagnosis not present

## 2023-12-14 DIAGNOSIS — I959 Hypotension, unspecified: Secondary | ICD-10-CM | POA: Diagnosis not present

## 2023-12-14 DIAGNOSIS — D62 Acute posthemorrhagic anemia: Secondary | ICD-10-CM | POA: Diagnosis not present

## 2023-12-14 LAB — CBC
HCT: 23.6 % — ABNORMAL LOW (ref 36.0–46.0)
Hemoglobin: 7.2 g/dL — ABNORMAL LOW (ref 12.0–15.0)
MCH: 24.4 pg — ABNORMAL LOW (ref 26.0–34.0)
MCHC: 30.5 g/dL (ref 30.0–36.0)
MCV: 80 fL (ref 80.0–100.0)
Platelets: 481 K/uL — ABNORMAL HIGH (ref 150–400)
RBC: 2.95 MIL/uL — ABNORMAL LOW (ref 3.87–5.11)
RDW: 19.4 % — ABNORMAL HIGH (ref 11.5–15.5)
WBC: 12.1 K/uL — ABNORMAL HIGH (ref 4.0–10.5)
nRBC: 0.2 % (ref 0.0–0.2)

## 2023-12-14 LAB — POTASSIUM: Potassium: 3.5 mmol/L (ref 3.5–5.1)

## 2023-12-14 NOTE — Progress Notes (Signed)
 PROGRESS NOTE    [name]  FMW:[id number] DOB: [birth year] DOA: 12/05/2023 PCP: Ilah Crigler, MD   Brief Narrative: Victoria Chavez is a 43 y.o. female with a history of hypertension, morbid obesity, sleep apnea, iron  deficiency anemia, ventral hernia status post mesh repair, left tubo-ovarian abscess.  Patient presented secondary to emesis and abdominal pain was found to have evidence of high-grade small bowel obstruction secondary to known ventral hernia.  General surgery was consulted and performed an open repair.  Hospitalization complicated by development of AKI and postoperative anemia.   Assessment and Plan:  High grade small bowel obstruction Patient with associated nausea, vomiting, abdominal pain.  Bowel obstruction diagnosed on CT imaging with evidence of a transition point within the large inferior ventral hernia.  General surgery was consulted and performed an open ventral hernia repair with lysis of adhesions and explantation of intraperitoneal mesh on 8/11. NG tube removed on 8/14. - General Surgery recommendations: Soft diet, continue JP drain  AKI Baseline creatinine of 0.7. Severe worsening of renal function likely secondary to ATN from hypotension. Creatinine up to a peak of 4.03. Renal ultrasound obtained on 8/12 significant for no abnormalities noted.  Urine output has improved significantly with documented 1,350 mL over the last 24 hours. Creatinine down to 0.99. AKI resolved. -Strict in/out -Daily BMP  Acute blood loss anemia Chronic iron  deficiency anemia Baseline hemoglobin of 10-11. Hemoglobin of 12.2 on admission which immediately decreased to 11. Postoperatively, hemoglobin down to 8.3. Patient with perioperative blood loss documented at 100 mL. Patient received 1 unit of PRBC on 8/12 and required another 2 units of PRBC on 8/14. Concern for possible hematoma, per general surgery. -Trend CBC/H&H  Hypotension Presumed secondary to blood loss anemia  and exacerbated by antihypertensive medications. Blood pressure improved.  Hypokalemia Potassium supplementation as needed  Sinus tachycardia Stable. Asymptomatic. Improved after blood transfusion.  Thrombocytosis Likely reactive. Improving.  Primary hypertension Patient is managed on amlodipine  and spironolactone  as an outpatient which are held secondary to hypotension.  History of tuboovarian abscess Present on admission and diagnosed on prior admission. Patient started on IV antibiotics at that time and transitioned to oral Augmentin  to complete a two-week course. Course completed at this time. Gynecology recommendations for repeat pelvic ultrasound in 6-8 weeks from diagnosis. -Will contact gynecology in AM for recommendations prior to discharge  Presyncope Occurred during admission. Presumed secondary to hypotension from perioperative blood loss.  Obesity, class III Estimated body mass index is 46.31 kg/m as calculated from the following:   Height as of this encounter: 5' 7 (1.702 m).   Weight as of this encounter: 134.1 kg.   DVT prophylaxis: SCDs Code Status:   Code Status: Full Code Family Communication: None at bedside Disposition Plan: Discharge home likely in 2-3 days pending ongoing general surgery recommendations/management and stable hemoglobin   Consultants:  General surgery  Procedures:  General surgery Open ventral hernia repair  Lysis of adhesions Explantation of intraperitoneal mesh  Antimicrobials: Zosyn  IV   Subjective: No concerns this morning. Eager for discharge. Tolerating diet.  Objective: BP 127/73 (BP Location: Right Arm)   Pulse (!) 104   Temp 98.4 F (36.9 C) (Oral)   Resp 18   Ht 5' 7 (1.702 m)   Wt 134.1 kg   LMP 11/10/2023 (Exact Date)   SpO2 100%   BMI 46.31 kg/m   Examination:  General exam: Appears calm and comfortable Respiratory system: Clear to auscultation. Respiratory effort normal. Cardiovascular system: S1  &  S2 heard, RRR. 1/6 systolic murmur. Gastrointestinal system: Abdomen is nondistended, soft and nontender. Normal bowel sounds heard. Central nervous system: Alert and oriented. No focal neurological deficits. Musculoskeletal: No edema. No calf tenderness Psychiatry: Judgement and insight appear normal. Mood & affect appropriate.    Data Reviewed: I have personally reviewed following labs and imaging studies  CBC Lab Results  Component Value Date   WBC 12.1 (H) 12/14/2023   RBC 2.95 (L) 12/14/2023   HGB 7.2 (L) 12/14/2023   HCT 23.6 (L) 12/14/2023   MCV 80.0 12/14/2023   MCH 24.4 (L) 12/14/2023   PLT 481 (H) 12/14/2023   MCHC 30.5 12/14/2023   RDW 19.4 (H) 12/14/2023   LYMPHSABS 1.0 12/09/2023   MONOABS 2.0 (H) 12/09/2023   EOSABS 0.0 12/09/2023   BASOSABS 0.0 12/09/2023     Last metabolic panel Lab Results  Component Value Date   NA 135 12/13/2023   K 3.0 (L) 12/13/2023   CL 101 12/13/2023   CO2 23 12/13/2023   BUN 12 12/13/2023   CREATININE 0.99 12/13/2023   GLUCOSE 118 (H) 12/13/2023   GFRNONAA >60 12/13/2023   GFRAA >60 10/28/2019   CALCIUM 8.5 (L) 12/13/2023   PROT 7.0 12/06/2023   ALBUMIN  2.9 (L) 12/06/2023   BILITOT 0.2 12/06/2023   ALKPHOS 68 12/06/2023   AST 12 (L) 12/06/2023   ALT 12 12/06/2023   ANIONGAP 11 12/13/2023    GFR: Estimated Creatinine Clearance: 104.8 mL/min (by C-G formula based on SCr of 0.99 mg/dL).  No results found for this or any previous visit (from the past 240 hours).    Radiology Studies: No results found.     LOS: 9 days    Elgin Lam, MD [facility] 12/14/2023, 8:53 AM   If 7PM-7AM, please contact night-coverage [email or web address]

## 2023-12-14 NOTE — Plan of Care (Signed)
  Problem: Elimination: Goal: Will not experience complications related to bowel motility Outcome: Progressing Goal: Will not experience complications related to urinary retention Outcome: Progressing   Problem: Coping: Goal: Level of anxiety will decrease Outcome: Progressing   Problem: Nutrition: Goal: Adequate nutrition will be maintained Outcome: Progressing   Problem: Pain Managment: Goal: General experience of comfort will improve and/or be controlled Outcome: Progressing

## 2023-12-14 NOTE — Progress Notes (Signed)
    Assessment & Plan: POD#5 - s/p open VHR, LOA x 1 hour, explantation of mesh - Dr. Lyndel 12/08/23 - Hgb stable at 7.2 this morning - Continue JP drain, wound dry and intact with honeycomb - leave in place - Soft diet tolerated - Mobilize, pulm toilet   FEN - soft, IVF per primary  VTE - SCDs ID - Zosyn  per primary for TOA. Does not need abx from our standpoint   AKI - Improving. Avoid nephrotoxic medications. HTN - Per primary  TOA - Per GYN. Abx. They recommended 14d of abx and repeat ultrasound in 6-8wks during last admission  Patient doing well, mobilizing, tolerating regular diet with BM's.  Wound intact - leave honeycomb dressing in place.  Apparently not receiving IV abx's (?) - per GYN on abx's for TOA.  Will ask Dr. Briana to address.        Krystal Spinner, MD [facility] practice Office: [phone]        Chief Complaint: VH, SBO  Subjective: Patient doing well, up in chair, taking regular breakfast, having BM's.  Objective: Vital signs in last 24 hours: Temp:  [98.1 F (36.7 C)-99.2 F (37.3 C)] 98.4 F (36.9 C) (08/17 0831) Pulse Rate:  [98-111] 104 (08/17 0831) Resp:  [16-19] 18 (08/17 0831) BP: (118-141)/(73-103) 127/73 (08/17 0831) SpO2:  [95 %-100 %] 100 % (08/17 0831) Last BM Date : 12/13/23  Intake/Output from previous day: 08/16 0701 - 08/17 0700 In: 853 [P.O.:838] Out: 5 [Drains:5] Intake/Output this shift: Total I/O In: -  Out: 10 [Drains:10]  Physical Exam: HEENT - sclerae clear, mucous membranes moist Abdomen - soft, obese; honeycomb dressing with minimal serosanguinous centrally  Lab Results:  Recent Labs    12/13/23 0452 12/14/23 0433  WBC 10.5 12.1*  HGB 7.3* 7.2*  HCT 23.9* 23.6*  PLT 408* 481*   BMET Recent Labs    12/12/23 0530 12/13/23 0452  NA 137 135  K 3.7 3.0*  CL 101 101  CO2 25 23  GLUCOSE 135* 118*  BUN 21* 12  CREATININE 1.58* 0.99  CALCIUM 8.3* 8.5*   PT/INR No  results for input(s): LABPROT, INR in the last 72 hours. Comprehensive Metabolic Panel:    Component Value Date/Time   NA 135 12/13/2023 0452   NA 137 12/12/2023 0530   K 3.0 (L) 12/13/2023 0452   K 3.7 12/12/2023 0530   CL 101 12/13/2023 0452   CL 101 12/12/2023 0530   CO2 23 12/13/2023 0452   CO2 25 12/12/2023 0530   BUN 12 12/13/2023 0452   BUN 21 (H) 12/12/2023 0530   CREATININE 0.99 12/13/2023 0452   CREATININE 1.58 (H) 12/12/2023 0530   GLUCOSE 118 (H) 12/13/2023 0452   GLUCOSE 135 (H) 12/12/2023 0530   CALCIUM 8.5 (L) 12/13/2023 0452   CALCIUM 8.3 (L) 12/12/2023 0530   AST 12 (L) 12/06/2023 0240   AST 17 12/05/2023 1014   ALT 12 12/06/2023 0240   ALT 14 12/05/2023 1014   ALKPHOS 68 12/06/2023 0240   ALKPHOS 81 12/05/2023 1014   BILITOT 0.2 12/06/2023 0240   BILITOT 0.2 12/05/2023 1014   PROT 7.0 12/06/2023 0240   PROT 8.1 12/05/2023 1014   ALBUMIN  2.9 (L) 12/06/2023 0240   ALBUMIN  3.4 (L) 12/05/2023 1014    Studies/Results: No results found.    Krystal Spinner 12/14/2023  Patient ID: Edd Earnie Seip, female   DOB: 1981-04-12, 43 y.o.   MRN: [id number]

## 2023-12-15 DIAGNOSIS — N179 Acute kidney failure, unspecified: Secondary | ICD-10-CM | POA: Diagnosis not present

## 2023-12-15 DIAGNOSIS — D62 Acute posthemorrhagic anemia: Secondary | ICD-10-CM | POA: Diagnosis not present

## 2023-12-15 DIAGNOSIS — I959 Hypotension, unspecified: Secondary | ICD-10-CM | POA: Diagnosis not present

## 2023-12-15 DIAGNOSIS — K56609 Unspecified intestinal obstruction, unspecified as to partial versus complete obstruction: Secondary | ICD-10-CM | POA: Diagnosis not present

## 2023-12-15 LAB — CBC
HCT: 23.6 % — ABNORMAL LOW (ref 36.0–46.0)
Hemoglobin: 7.2 g/dL — ABNORMAL LOW (ref 12.0–15.0)
MCH: 24.5 pg — ABNORMAL LOW (ref 26.0–34.0)
MCHC: 30.5 g/dL (ref 30.0–36.0)
MCV: 80.3 fL (ref 80.0–100.0)
Platelets: 479 K/uL — ABNORMAL HIGH (ref 150–400)
RBC: 2.94 MIL/uL — ABNORMAL LOW (ref 3.87–5.11)
RDW: 19.4 % — ABNORMAL HIGH (ref 11.5–15.5)
WBC: 9.2 K/uL (ref 4.0–10.5)
nRBC: 0.3 % — ABNORMAL HIGH (ref 0.0–0.2)

## 2023-12-15 MED ORDER — POLYETHYLENE GLYCOL 3350 17 G PO PACK: 17.0000 g | PACK | Freq: Every day | ORAL | Status: DC | PRN

## 2023-12-15 MED ORDER — METHOCARBAMOL 500 MG PO TABS: 750.0000 mg | ORAL_TABLET | Freq: Four times a day (QID) | ORAL | Status: DC | PRN

## 2023-12-15 MED ORDER — DOCUSATE SODIUM 100 MG PO CAPS
100.0000 mg | ORAL_CAPSULE | Freq: Two times a day (BID) | ORAL | Status: DC
  Administered 2023-12-15 – 2023-12-17 (×5): 100 mg via ORAL
  Filled 2023-12-15 (×5): qty 1

## 2023-12-15 MED ORDER — ACETAMINOPHEN 500 MG PO TABS
1000.0000 mg | ORAL_TABLET | Freq: Four times a day (QID) | ORAL | Status: DC
  Administered 2023-12-15 – 2023-12-17 (×7): 1000 mg via ORAL
  Filled 2023-12-15 (×7): qty 2

## 2023-12-15 MED ORDER — OXYCODONE HCL 5 MG PO TABS: 5.0000 mg | ORAL_TABLET | ORAL | Status: DC | PRN

## 2023-12-15 NOTE — Plan of Care (Signed)

## 2023-12-15 NOTE — TOC Progression Note (Signed)
 Transition of Care [facility]) - Progression Note    Patient Details  Name: Victoria Chavez MRN: [id number] Date of Birth: 11-14-1980  Transition of Care [facility]) CM/SW Contact  Andrez JULIANNA George, RN Phone Number: 12/15/2023, 3:38 PM  Clinical Narrative:     Surgery to reassess her wound in am to see if needs opened.  IP Care management following.  Expected Discharge Plan: Home/Self Care Barriers to Discharge: Continued Medical Work up               Expected Discharge Plan and Services In-house Referral: NA Discharge Planning Services: CM Consult Post Acute Care Choice: NA Living arrangements for the past 2 months: Apartment                   DME Agency: NA       HH Arranged: NA           Social Drivers of Health (SDOH) Interventions SDOH Screenings   Food Insecurity: Food Insecurity Present (12/05/2023)  Housing: High Risk (12/05/2023)  Transportation Needs: No Transportation Needs (12/05/2023)  Utilities: Not At Risk (12/05/2023)  Depression (PHQ2-9): Low Risk  (12/03/2023)  Tobacco Use: Medium Risk (12/08/2023)    Readmission Risk Interventions    11/26/2023   12:01 PM  Readmission Risk Prevention Plan  Post Dischage Appt Complete  Medication Screening Complete  Transportation Screening Complete

## 2023-12-15 NOTE — Plan of Care (Signed)
  Problem: Activity: Goal: Risk for activity intolerance will decrease 12/15/2023 1655 by Tonna Erla RAMAN, RN Outcome: Progressing 12/15/2023 1229 by Tonna Erla RAMAN, RN Outcome: Progressing   Problem: Nutrition: Goal: Adequate nutrition will be maintained 12/15/2023 1655 by Tonna Erla RAMAN, RN Outcome: Progressing 12/15/2023 1229 by Tonna Erla RAMAN, RN Outcome: Progressing   Problem: Elimination: Goal: Will not experience complications related to bowel motility 12/15/2023 1655 by Tonna Erla RAMAN, RN Outcome: Progressing 12/15/2023 1229 by Tonna Erla RAMAN, RN Outcome: Progressing Goal: Will not experience complications related to urinary retention 12/15/2023 1655 by Tonna Erla RAMAN, RN Outcome: Progressing 12/15/2023 1229 by Tonna Erla RAMAN, RN Outcome: Progressing

## 2023-12-15 NOTE — Progress Notes (Signed)
 PROGRESS NOTE    Victoria Chavez  FMW:[id number] DOB: [birth year] DOA: 12/05/2023 PCP: Ilah Crigler, MD   Brief Narrative: Victoria Chavez is a 43 y.o. female with a history of hypertension, morbid obesity, sleep apnea, iron  deficiency anemia, ventral hernia status post mesh repair, left tubo-ovarian abscess.  Patient presented secondary to emesis and abdominal pain was found to have evidence of high-grade small bowel obstruction secondary to known ventral hernia.  General surgery was consulted and performed an open repair.  Hospitalization complicated by development of AKI and postoperative anemia.   Assessment and Plan:  High grade small bowel obstruction Patient with associated nausea, vomiting, abdominal pain.  Bowel obstruction diagnosed on CT imaging with evidence of a transition point within the large inferior ventral hernia.  General surgery was consulted and performed an open ventral hernia repair with lysis of adhesions and explantation of intraperitoneal mesh on 8/11. NG tube removed on 8/14. - General Surgery recommendations: Soft diet, continue JP drain; plan to reevaluate in AM  AKI Baseline creatinine of 0.7. Severe worsening of renal function likely secondary to ATN from hypotension. Creatinine up to a peak of 4.03. Renal ultrasound obtained on 8/12 significant for no abnormalities noted.  Urine output has improved significantly with documented 1,350 mL over the last 24 hours. Creatinine down to 0.99. AKI resolved. -Strict in/out -Daily BMP  Acute blood loss anemia Chronic iron  deficiency anemia Baseline hemoglobin of 10-11. Hemoglobin of 12.2 on admission which immediately decreased to 11. Postoperatively, hemoglobin down to 8.3. Patient with perioperative blood loss documented at 100 mL. Patient received 1 unit of PRBC on 8/12 and required another 2 units of PRBC on 8/14. Concern for possible hematoma, per general surgery. Hemoglobin remains low but stable. -Trend  CBC/H&H  Hypotension Presumed secondary to blood loss anemia and exacerbated by antihypertensive medications. Blood pressure improved and hypotension is resolved.  Hypokalemia Potassium supplementation as needed  Sinus tachycardia Stable. Asymptomatic. Improved after blood transfusion.  Thrombocytosis Likely reactive. Improving.  Primary hypertension Patient is managed on amlodipine  and spironolactone  as an outpatient which are held secondary to hypotension.  History of tuboovarian abscess Present on admission and diagnosed on prior admission. Patient started on IV antibiotics at that time and transitioned to oral Augmentin  to complete a two-week course. Course completed at this time. Gynecology recommendations for repeat pelvic ultrasound in 6-8 weeks from diagnosis. -Contacted patient's primary OB/Gyn (8/18), Dr. Rosalva; awaiting call back  Presyncope Occurred during admission. Presumed secondary to hypotension from perioperative blood loss.  Obesity, class III Estimated body mass index is 46.31 kg/m as calculated from the following:   Height as of this encounter: 5' 7 (1.702 m).   Weight as of this encounter: 134.1 kg.   DVT prophylaxis: SCDs Code Status:   Code Status: Full Code Family Communication: None at bedside Disposition Plan: Discharge home likely in 1-2 days pending ongoing general surgery recommendations/management and stable hemoglobin   Consultants:  General surgery Ob/gyn  Procedures:  General surgery Open ventral hernia repair  Lysis of adhesions Explantation of intraperitoneal mesh  Antimicrobials: Zosyn  IV   Subjective: Doing very well. Ambulating down the halls. Noticed a lot of bloody fluid in JP drain, otherwise no other concerns.  Objective: BP 137/84 (BP Location: Right Arm)   Pulse 99   Temp 98.6 F (37 C) (Oral)   Resp 18   Ht 5' 7 (1.702 m)   Wt 134.1 kg   LMP 11/10/2023 (Exact Date)   SpO2 98%  BMI 46.31 kg/m    Examination:  General exam: Appears calm and comfortable Respiratory system: Clear to auscultation. Respiratory effort normal. Cardiovascular system: S1 & S2 heard, RRR. No murmurs. Gastrointestinal system: Abdomen is nondistended, soft and nontender. Normal bowel sounds heard. Serosanguinous fluid noted in JP drain Central nervous system: Alert and oriented. Psychiatry: Judgement and insight appear normal. Mood & affect appropriate.    Data Reviewed: I have personally reviewed following labs and imaging studies  CBC Lab Results  Component Value Date   WBC 9.2 12/15/2023   RBC 2.94 (L) 12/15/2023   HGB 7.2 (L) 12/15/2023   HCT 23.6 (L) 12/15/2023   MCV 80.3 12/15/2023   MCH 24.5 (L) 12/15/2023   PLT 479 (H) 12/15/2023   MCHC 30.5 12/15/2023   RDW 19.4 (H) 12/15/2023   LYMPHSABS 1.0 12/09/2023   MONOABS 2.0 (H) 12/09/2023   EOSABS 0.0 12/09/2023   BASOSABS 0.0 12/09/2023     Last metabolic panel Lab Results  Component Value Date   NA 135 12/13/2023   K 3.5 12/14/2023   CL 101 12/13/2023   CO2 23 12/13/2023   BUN 12 12/13/2023   CREATININE 0.99 12/13/2023   GLUCOSE 118 (H) 12/13/2023   GFRNONAA >60 12/13/2023   GFRAA >60 10/28/2019   CALCIUM 8.5 (L) 12/13/2023   PROT 7.0 12/06/2023   ALBUMIN  2.9 (L) 12/06/2023   BILITOT 0.2 12/06/2023   ALKPHOS 68 12/06/2023   AST 12 (L) 12/06/2023   ALT 12 12/06/2023   ANIONGAP 11 12/13/2023    GFR: Estimated Creatinine Clearance: 104.8 mL/min (by C-G formula based on SCr of 0.99 mg/dL).  No results found for this or any previous visit (from the past 240 hours).    Radiology Studies: No results found.     LOS: 10 days    Elgin Lam, MD [facility] 12/15/2023, 8:16 AM   If 7PM-7AM, please contact night-coverage [email or web address]

## 2023-12-15 NOTE — Progress Notes (Addendum)
 7 Days Post-Op  Subjective: CC: Reports she is sore around her incisions after moving more yesterday. Pain well controlled. Tolerating diet without n/v. Passing flatus. BM yesterday. Voiding. Mobilizing.   Objective: Vital signs in last 24 hours: Temp:  [98.2 F (36.8 C)-98.7 F (37.1 C)] 98.2 F (36.8 C) (08/18 0849) Pulse Rate:  [98-106] 106 (08/18 0849) Resp:  [18] 18 (08/18 0849) BP: (129-141)/(70-88) 134/79 (08/18 0849) SpO2:  [98 %-100 %] 100 % (08/18 0849) Last BM Date : 12/14/23  Intake/Output from previous day: 08/17 0701 - 08/18 0700 In: 956 [P.O.:956] Out: 25 [Drains:25] Intake/Output this shift: Total I/O In: -  Out: 20 [Drains:20]  PE: Gen:  Alert, NAD, pleasant Abd: Soft, ND, NT except around the inferior aspect of her incision. Midline wound with staples in place w/ noted area of bogginess around the inferior aspect with overlying heat and bruising vs erythema as seen in picture below. No active drainage. JP drain with bloody fluid in bulb, 25cc/24 hours.    Lab Results:  Recent Labs    12/14/23 0433 12/15/23 0704  WBC 12.1* 9.2  HGB 7.2* 7.2*  HCT 23.6* 23.6*  PLT 481* 479*   BMET Recent Labs    12/13/23 0452 12/14/23 1102  NA 135  --   K 3.0* 3.5  CL 101  --   CO2 23  --   GLUCOSE 118*  --   BUN 12  --   CREATININE 0.99  --   CALCIUM 8.5*  --    PT/INR No results for input(s): LABPROT, INR in the last 72 hours. CMP     Component Value Date/Time   NA 135 12/13/2023 0452   K 3.5 12/14/2023 1102   CL 101 12/13/2023 0452   CO2 23 12/13/2023 0452   GLUCOSE 118 (H) 12/13/2023 0452   BUN 12 12/13/2023 0452   CREATININE 0.99 12/13/2023 0452   CALCIUM 8.5 (L) 12/13/2023 0452   PROT 7.0 12/06/2023 0240   ALBUMIN  2.9 (L) 12/06/2023 0240   AST 12 (L) 12/06/2023 0240   ALT 12 12/06/2023 0240   ALKPHOS 68 12/06/2023 0240   BILITOT 0.2 12/06/2023 0240   GFRNONAA >60 12/13/2023 0452   GFRAA >60 10/28/2019 0422   Lipase      Component Value Date/Time   LIPASE 25 12/05/2023 1014    Studies/Results: No results found.  Anti-infectives: Anti-infectives (From admission, onward)    Start     Dose/Rate Route Frequency Ordered Stop   12/05/23 1700  piperacillin -tazobactam (ZOSYN ) IVPB 3.375 g  Status:  Discontinued        3.375 g 12.5 mL/hr over 240 Minutes Intravenous Every 8 hours 12/05/23 1609 12/11/23 0930        Assessment/Plan POD#7 - s/p open VHR, LOA x 1 hour, explantation of mesh - Dr. Lyndel 12/08/23 - Soft diet as tolerated - ABL anemia post op with tachycardia and hypotension. Hypotension resolved. Hgb stable  - Continue JP drain - Reviewed midline wound with attending. Will hold on opening the wound today and monitor - if worsening in the AM will need to remove staples and open skin along the inferior aspect of wound.  - Soft diet tolerated - Mobilize, pulm toilet   FEN - Soft, IVF per primary  VTE - SCDs, on hold for ABL anemia as above ID - Zosyn  completed per primary for TOA. Does not need abx from our standpoint. None currently. Afebrile. WBC wnl   AKI - Resolved. HTN -  Per primary  TOA - Per GYN. Abx. They recommended 14d of abx and repeat ultrasound in 6-8wks during last admission    LOS: 10 days    Ozell CHRISTELLA Shaper, [facility] 12/15/2023, 10:56 AM Please see Amion for pager number during day hours 7:00am-4:30pm

## 2023-12-16 DIAGNOSIS — K56609 Unspecified intestinal obstruction, unspecified as to partial versus complete obstruction: Secondary | ICD-10-CM | POA: Diagnosis not present

## 2023-12-16 DIAGNOSIS — I959 Hypotension, unspecified: Secondary | ICD-10-CM | POA: Diagnosis not present

## 2023-12-16 DIAGNOSIS — N179 Acute kidney failure, unspecified: Secondary | ICD-10-CM | POA: Diagnosis not present

## 2023-12-16 DIAGNOSIS — D62 Acute posthemorrhagic anemia: Secondary | ICD-10-CM | POA: Diagnosis not present

## 2023-12-16 LAB — POTASSIUM: Potassium: 3.3 mmol/L — ABNORMAL LOW (ref 3.5–5.1)

## 2023-12-16 LAB — PREPARE FRESH FROZEN PLASMA: Unit division: 0

## 2023-12-16 LAB — BPAM FFP
Blood Product Expiration Date: 202508182359
Blood Product Expiration Date: 202508182359
Blood Product Expiration Date: 202508202359
Blood Product Expiration Date: 202508202359
ISSUE DATE / TIME: 202508151157
ISSUE DATE / TIME: 202508151157
Unit Type and Rh: 6200
Unit Type and Rh: 6200
Unit Type and Rh: 6200
Unit Type and Rh: 6200

## 2023-12-16 LAB — CBC
HCT: 23.5 % — ABNORMAL LOW (ref 36.0–46.0)
Hemoglobin: 7.1 g/dL — ABNORMAL LOW (ref 12.0–15.0)
MCH: 24.4 pg — ABNORMAL LOW (ref 26.0–34.0)
MCHC: 30.2 g/dL (ref 30.0–36.0)
MCV: 80.8 fL (ref 80.0–100.0)
Platelets: 504 K/uL — ABNORMAL HIGH (ref 150–400)
RBC: 2.91 MIL/uL — ABNORMAL LOW (ref 3.87–5.11)
RDW: 19.6 % — ABNORMAL HIGH (ref 11.5–15.5)
WBC: 8.8 K/uL (ref 4.0–10.5)
nRBC: 0 % (ref 0.0–0.2)

## 2023-12-16 LAB — PREPARE RBC (CROSSMATCH)

## 2023-12-16 LAB — HEMOGLOBIN AND HEMATOCRIT, BLOOD
HCT: 28.2 % — ABNORMAL LOW (ref 36.0–46.0)
Hemoglobin: 8.5 g/dL — ABNORMAL LOW (ref 12.0–15.0)

## 2023-12-16 LAB — MAGNESIUM: Magnesium: 1.6 mg/dL — ABNORMAL LOW (ref 1.7–2.4)

## 2023-12-16 MED ORDER — POTASSIUM CHLORIDE CRYS ER 20 MEQ PO TBCR
40.0000 meq | EXTENDED_RELEASE_TABLET | ORAL | Status: AC
  Administered 2023-12-16 (×2): 40 meq via ORAL
  Filled 2023-12-16 (×2): qty 2

## 2023-12-16 MED ORDER — MAGNESIUM SULFATE 4 GM/100ML IV SOLN
4.0000 g | Freq: Once | INTRAVENOUS | Status: AC
  Administered 2023-12-16: 4 g via INTRAVENOUS
  Filled 2023-12-16: qty 100

## 2023-12-16 MED ORDER — SODIUM CHLORIDE 0.9% IV SOLUTION: Freq: Once | INTRAVENOUS | Status: AC

## 2023-12-16 MED ORDER — PANTOPRAZOLE SODIUM 40 MG PO TBEC
40.0000 mg | DELAYED_RELEASE_TABLET | Freq: Two times a day (BID) | ORAL | Status: DC
  Administered 2023-12-16 – 2023-12-17 (×3): 40 mg via ORAL
  Filled 2023-12-16 (×3): qty 1

## 2023-12-16 NOTE — Progress Notes (Addendum)
 8 Days Post-Op  Subjective: CC: Reports an area of her LUQ today that has bruising, a knot under the skin and is painful. This was not there yesterday. No trauma to the area. Seen with RN. Doesn't sound like this is where she was getting ppx Lovenox . She is otherwise sore around her incisions that appears stable from yesterday. Pain well controlled. Tolerating diet without n/v. Passing flatus. Small BM yesterday. Voiding. Mobilizing.   Objective: Vital signs in last 24 hours: Temp:  [97.6 F (36.4 C)-98.2 F (36.8 C)] 97.8 F (36.6 C) (08/19 0751) Pulse Rate:  [102-104] 104 (08/19 0533) Resp:  [16-18] 17 (08/19 0533) BP: (118-143)/(79-93) 143/93 (08/19 0751) SpO2:  [98 %-100 %] 98 % (08/19 0751) Last BM Date : 12/15/23  Intake/Output from previous day: 08/18 0701 - 08/19 0700 In: -  Out: 41 [Drains:41] Intake/Output this shift: Total I/O In: -  Out: 5 [Drains:5]  PE: Gen:  Alert, NAD, pleasant Abd: Soft, ND. She has an area in the LUQ with bruising and a small nodular firm area under the skin that is overall non-mobile, nothing is reducible and she does have some ttp over the area. No overlying heat or erythema over the area. She has ttp over the inferior aspect of her incision as well. Otherwise NT. Midline wound stable from yesterday with staples in place w/ noted area of bogginess around the inferior aspect with darkening of the skin along the inferior aspect of her incision without blanching and without evidence of purulent drainage that is stable from the picture from 8/18. JP drain with bloody fluid in bulb, 41cc/24 hours.    Lab Results:  Recent Labs    12/15/23 0704 12/16/23 0514  WBC 9.2 8.8  HGB 7.2* 7.1*  HCT 23.6* 23.5*  PLT 479* 504*   BMET Recent Labs    12/14/23 1102 12/16/23 0514  K 3.5 3.3*   PT/INR No results for input(s): LABPROT, INR in the last 72 hours. CMP     Component Value Date/Time   NA 135 12/13/2023 0452   K 3.3 (L)  12/16/2023 0514   CL 101 12/13/2023 0452   CO2 23 12/13/2023 0452   GLUCOSE 118 (H) 12/13/2023 0452   BUN 12 12/13/2023 0452   CREATININE 0.99 12/13/2023 0452   CALCIUM 8.5 (L) 12/13/2023 0452   PROT 7.0 12/06/2023 0240   ALBUMIN  2.9 (L) 12/06/2023 0240   AST 12 (L) 12/06/2023 0240   ALT 12 12/06/2023 0240   ALKPHOS 68 12/06/2023 0240   BILITOT 0.2 12/06/2023 0240   GFRNONAA >60 12/13/2023 0452   GFRAA >60 10/28/2019 0422   Lipase     Component Value Date/Time   LIPASE 25 12/05/2023 1014    Studies/Results: No results found.  Anti-infectives: Anti-infectives (From admission, onward)    Start     Dose/Rate Route Frequency Ordered Stop   12/05/23 1700  piperacillin -tazobactam (ZOSYN ) IVPB 3.375 g  Status:  Discontinued        3.375 g 12.5 mL/hr over 240 Minutes Intravenous Every 8 hours 12/05/23 1609 12/11/23 0930        Assessment/Plan POD#8 - s/p open VHR, LOA x 1 hour, explantation of mesh - Dr. Lyndel 12/08/23 - Soft diet as tolerated - ABL anemia post op with tachycardia and hypotension. Hypotension resolved. Hgb overall stable at 7.1 today. Discussed with [facility]. With continued tachycardia, they plan for 1U PRBC today.  - Continue JP drain - Continue to monitor midline wound. Will hold  on removing staples and continue to monitor.  - Will review area of LUQ with my attending. There is no overlying incision or signs of superficial infection. She denies trauma and this does not appear to be an area where she had subcutaneous injection per report. She did have explantation of intraperitoneal mesh and unclear if this could be related to this. Can hold on CT for now but will review with attending if need CT to evaluate.  - Mobilize, pulm toilet   FEN - Soft, IVF per primary  VTE - SCDs, on hold for ABL anemia as above ID - Zosyn  completed per primary for TOA. Does not need abx from our standpoint. None currently. Afebrile. WBC wnl   AKI - Resolved. HTN - Per primary   TOA - Per GYN. Abx. They recommended 14d of abx and repeat ultrasound in 6-8wks during last admission    LOS: 11 days    Victoria Chavez, [facility] 12/16/2023, 9:42 AM Please see Amion for pager number during day hours 7:00am-4:30pm

## 2023-12-16 NOTE — Progress Notes (Addendum)
 PROGRESS NOTE    Victoria Chavez  FMW:[id number] DOB: [birth year] DOA: 12/05/2023 PCP: Ilah Crigler, MD   Brief Narrative: Victoria Chavez is a 43 y.o. female with a history of hypertension, morbid obesity, sleep apnea, iron  deficiency anemia, ventral hernia status post mesh repair, left tubo-ovarian abscess.  Patient presented secondary to emesis and abdominal pain was found to have evidence of high-grade small bowel obstruction secondary to known ventral hernia.  General surgery was consulted and performed an open repair.  Hospitalization complicated by development of AKI and postoperative anemia.   Assessment and Plan:  High grade small bowel obstruction Patient with associated nausea, vomiting, abdominal pain.  Bowel obstruction diagnosed on CT imaging with evidence of a transition point within the large inferior ventral hernia.  General surgery was consulted and performed an open ventral hernia repair with lysis of adhesions and explantation of intraperitoneal mesh on 8/11. NG tube removed on 8/14. - General Surgery recommendations: Soft diet, continue JP drain; plan to reevaluate in AM  AKI Baseline creatinine of 0.7. Severe worsening of renal function likely secondary to ATN from hypotension. Creatinine up to a peak of 4.03. Renal ultrasound obtained on 8/12 significant for no abnormalities noted.  Urine output has improved significantly with documented 1,350 mL over the last 24 hours. Creatinine down to 0.99. AKI resolved. -Strict in/out -Daily BMP  Acute blood loss anemia Chronic iron  deficiency anemia Baseline hemoglobin of 10-11. Hemoglobin of 12.2 on admission which immediately decreased to 11. Postoperatively, hemoglobin down to 8.3. Patient with perioperative blood loss documented at 100 mL. Patient received 1 unit of PRBC on 8/12 and required another 2 units of PRBC on 8/14. Concern for possible hematoma, per general surgery. Hemoglobin remains low but stable. -Trend  CBC/H&H  Hypotension Presumed secondary to blood loss anemia and exacerbated by antihypertensive medications. Blood pressure improved and hypotension is resolved.  Hypokalemia Potassium supplementation as needed  Hypomagnesemia -Magnesium  supplementation  Subcutaneous mass Unclear but possible hematoma with overlying bruise in addition to acuity of lesion. Unlikely bowel.  Sinus tachycardia Stable. Asymptomatic. Improved after blood transfusion, now slightly worsened, but not significantly elevated.  Thrombocytosis Likely reactive. Slight worsening, but now stable..  Primary hypertension Patient is managed on amlodipine  and spironolactone  as an outpatient which are held secondary to hypotension this admission.  History of tuboovarian abscess Present on admission and diagnosed on prior admission. Patient started on IV antibiotics at that time and transitioned to oral Augmentin  to complete a two-week course. Course completed at this time. Gynecology recommendations for repeat pelvic ultrasound in 6-8 weeks from diagnosis. -Contacted patient's primary OB/Gyn/on-call (8/18 and 8/19), still awaiting call back  Presyncope Occurred during admission. Presumed secondary to hypotension from perioperative blood loss.  Obesity, class III Estimated body mass index is 46.31 kg/m as calculated from the following:   Height as of this encounter: 5' 7 (1.702 m).   Weight as of this encounter: 134.1 kg.   DVT prophylaxis: SCDs Code Status:   Code Status: Full Code Family Communication: None at bedside Disposition Plan: Discharge home likely in 1-2 days pending ongoing general surgery recommendations/management and stable hemoglobin   Consultants:  General surgery Ob/gyn  Procedures:  General surgery Open ventral hernia repair  Lysis of adhesions Explantation of intraperitoneal mesh  Antimicrobials: Zosyn  IV   Subjective: Noticed a subcutaneous nodule yesterday that she has not  seen before. Some tenderness, but otherwise no other issues. No bowel movement overnight but passing gas.  Objective: BP (!) 143/93  Pulse (!) 104   Temp 97.8 F (36.6 C) (Oral)   Resp 17   Ht 5' 7 (1.702 m)   Wt 134.1 kg   LMP 11/10/2023 (Exact Date)   SpO2 98%   BMI 46.31 kg/m   Examination:  General exam: Appears calm and comfortable Respiratory system: Clear to auscultation. Respiratory effort normal. Cardiovascular system: S1 & S2 heard, RRR. No murmurs, rubs, gallops or clicks. Gastrointestinal system: Abdomen is nondistended, soft and nontender. Subcutaneous mass felt in LUQ with tenderness. Normal bowel sounds heard. Central nervous system: Alert and oriented. No focal neurological deficits. Musculoskeletal: No edema. No calf tenderness Psychiatry: Judgement and insight appear normal. Mood & affect appropriate.    Data Reviewed: I have personally reviewed following labs and imaging studies  CBC Lab Results  Component Value Date   WBC 8.8 12/16/2023   RBC 2.91 (L) 12/16/2023   HGB 7.1 (L) 12/16/2023   HCT 23.5 (L) 12/16/2023   MCV 80.8 12/16/2023   MCH 24.4 (L) 12/16/2023   PLT 504 (H) 12/16/2023   MCHC 30.2 12/16/2023   RDW 19.6 (H) 12/16/2023   LYMPHSABS 1.0 12/09/2023   MONOABS 2.0 (H) 12/09/2023   EOSABS 0.0 12/09/2023   BASOSABS 0.0 12/09/2023     Last metabolic panel Lab Results  Component Value Date   NA 135 12/13/2023   K 3.3 (L) 12/16/2023   CL 101 12/13/2023   CO2 23 12/13/2023   BUN 12 12/13/2023   CREATININE 0.99 12/13/2023   GLUCOSE 118 (H) 12/13/2023   GFRNONAA >60 12/13/2023   GFRAA >60 10/28/2019   CALCIUM 8.5 (L) 12/13/2023   PROT 7.0 12/06/2023   ALBUMIN  2.9 (L) 12/06/2023   BILITOT 0.2 12/06/2023   ALKPHOS 68 12/06/2023   AST 12 (L) 12/06/2023   ALT 12 12/06/2023   ANIONGAP 11 12/13/2023    GFR: Estimated Creatinine Clearance: 104.8 mL/min (by C-G formula based on SCr of 0.99 mg/dL).  No results found for this or any  previous visit (from the past 240 hours).    Radiology Studies: No results found.     LOS: 11 days    Elgin Lam, MD [facility] 12/16/2023, 8:46 AM   If 7PM-7AM, please contact night-coverage [email or web address]

## 2023-12-16 NOTE — Plan of Care (Signed)
  Problem: Education: Goal: Knowledge of General Education information will improve Description: Including pain rating scale, medication(s)/side effects and non-pharmacologic comfort measures Outcome: Progressing   Problem: Health Behavior/Discharge Planning: Goal: Ability to manage health-related needs will improve Outcome: Progressing   Problem: Clinical Measurements: Goal: Ability to maintain clinical measurements within normal limits will improve Outcome: Progressing   Problem: Activity: Goal: Risk for activity intolerance will decrease Outcome: Progressing   Problem: Nutrition: Goal: Adequate nutrition will be maintained Outcome: Progressing   Problem: Coping: Goal: Level of anxiety will decrease Outcome: Progressing   Problem: Pain Managment: Goal: General experience of comfort will improve and/or be controlled Outcome: Progressing   Problem: Safety: Goal: Ability to remain free from injury will improve Outcome: Progressing   Problem: Skin Integrity: Goal: Risk for impaired skin integrity will decrease Outcome: Progressing

## 2023-12-17 ENCOUNTER — Encounter: Payer: Self-pay | Admitting: Nurse Practitioner

## 2023-12-17 ENCOUNTER — Inpatient Hospital Stay

## 2023-12-17 ENCOUNTER — Other Ambulatory Visit (HOSPITAL_COMMUNITY): Payer: Self-pay

## 2023-12-17 DIAGNOSIS — K56609 Unspecified intestinal obstruction, unspecified as to partial versus complete obstruction: Secondary | ICD-10-CM | POA: Diagnosis not present

## 2023-12-17 LAB — TYPE AND SCREEN
ABO/RH(D): O POS
Antibody Screen: NEGATIVE
Unit division: 0

## 2023-12-17 LAB — CBC
HCT: 26.2 % — ABNORMAL LOW (ref 36.0–46.0)
Hemoglobin: 8 g/dL — ABNORMAL LOW (ref 12.0–15.0)
MCH: 24.9 pg — ABNORMAL LOW (ref 26.0–34.0)
MCHC: 30.5 g/dL (ref 30.0–36.0)
MCV: 81.6 fL (ref 80.0–100.0)
Platelets: 498 K/uL — ABNORMAL HIGH (ref 150–400)
RBC: 3.21 MIL/uL — ABNORMAL LOW (ref 3.87–5.11)
RDW: 19 % — ABNORMAL HIGH (ref 11.5–15.5)
WBC: 10.1 K/uL (ref 4.0–10.5)
nRBC: 0 % (ref 0.0–0.2)

## 2023-12-17 LAB — BPAM RBC
Blood Product Expiration Date: 202509162359
ISSUE DATE / TIME: 202508191318
Unit Type and Rh: 5100

## 2023-12-17 MED ORDER — SIMETHICONE 80 MG PO CHEW: 80.0000 mg | CHEWABLE_TABLET | Freq: Four times a day (QID) | ORAL | 0 refills | Status: AC | PRN

## 2023-12-17 MED ORDER — OXYCODONE HCL 5 MG PO TABS
5.0000 mg | ORAL_TABLET | Freq: Four times a day (QID) | ORAL | 0 refills | Status: DC | PRN
  Filled 2023-12-17: qty 15, 4d supply, fill #0

## 2023-12-17 MED ORDER — DOCUSATE SODIUM 100 MG PO CAPS
100.0000 mg | ORAL_CAPSULE | Freq: Two times a day (BID) | ORAL | 0 refills | Status: AC
Start: 2023-12-17 — End: ?

## 2023-12-17 MED ORDER — PANTOPRAZOLE SODIUM 40 MG PO TBEC
40.0000 mg | DELAYED_RELEASE_TABLET | Freq: Every day | ORAL | 0 refills | Status: AC
  Filled 2023-12-17: qty 30, 30d supply, fill #0

## 2023-12-17 NOTE — Plan of Care (Signed)

## 2023-12-17 NOTE — TOC Transition Note (Signed)
 Transition of Care [facility]) - Discharge Note   Patient Details  Name: Victoria Chavez MRN: [id number] Date of Birth: 26-Sep-1980  [facility] CM/SW Contact:  Andrez JULIANNA George, RN Phone Number: 12/17/2023, 1:44 PM   Clinical Narrative:     Pt is discharging home with self care. No needs per IP Care management.   Final next level of care: Home/Self Care Barriers to Discharge: No Barriers Identified   Patient Goals and CMS Choice Patient states their goals for this hospitalization and ongoing recovery are:: patient plans to return home once stable.   Choice offered to / list presented to : NA      Discharge Placement                       Discharge Plan and Services Additional resources added to the After Visit Summary for   In-house Referral: NA Discharge Planning Services: CM Consult Post Acute Care Choice: NA            DME Agency: NA       HH Arranged: NA          Social Drivers of Health (SDOH) Interventions SDOH Screenings   Food Insecurity: Food Insecurity Present (12/05/2023)  Housing: High Risk (12/05/2023)  Transportation Needs: No Transportation Needs (12/05/2023)  Utilities: Not At Risk (12/05/2023)  Depression (PHQ2-9): Low Risk  (12/03/2023)  Tobacco Use: Medium Risk (12/08/2023)     Readmission Risk Interventions    11/26/2023   12:01 PM  Readmission Risk Prevention Plan  Post Dischage Appt Complete  Medication Screening Complete  Transportation Screening Complete

## 2023-12-17 NOTE — Progress Notes (Signed)
 9 Days Post-Op  Subjective: CC: Patient reports aside from the area in her LUQ, she has no abdominal pain. Area in her LUQ is less painful today. She has not needed PRN pain medication in the last 24 hours. Tolerating diet without n/v. BM yesterday.   Objective: Vital signs in last 24 hours: Temp:  [97.7 F (36.5 C)-98.5 F (36.9 C)] 98.2 F (36.8 C) (08/20 0723) Pulse Rate:  [102-109] 109 (08/20 0419) Resp:  [17-20] 20 (08/20 0419) BP: (118-159)/(74-110) 123/96 (08/20 0723) SpO2:  [95 %-100 %] 95 % (08/20 0723) Last BM Date : 12/16/23  Intake/Output from previous day: 08/19 0701 - 08/20 0700 In: 300 [Blood:300] Out: 30 [Drains:30] Intake/Output this shift: Total I/O In: 240 [P.O.:240] Out: -   PE: Gen:  Alert, NAD, pleasant Abd: Soft, ND. She has an area in the LUQ with bruising and a small nodular firm area under the skin that is overall non-mobile, nothing is reducible and she does have some ttp over the area. No overlying heat or erythema over the area. She has ttp over the inferior aspect of her incision as well. Otherwise NT. Midline wound stable from yesterday with staples in place w/ noted area of bogginess around the inferior aspect with darkening of the skin along the inferior aspect of her incision without blanching and without evidence of purulent drainage that is stable from the picture from 8/18. She did appear to have some old blood that drained onto dressing overnight. No signs of active bleeding or drainage. JP drain with bloody fluid in bulb, 30cc/24 hours.    Lab Results:  Recent Labs    12/16/23 0514 12/16/23 1952 12/17/23 0626  WBC 8.8  --  10.1  HGB 7.1* 8.5* 8.0*  HCT 23.5* 28.2* 26.2*  PLT 504*  --  498*   BMET Recent Labs    12/14/23 1102 12/16/23 0514  K 3.5 3.3*   PT/INR No results for input(s): LABPROT, INR in the last 72 hours. CMP     Component Value Date/Time   NA 135 12/13/2023 0452   K 3.3 (L) 12/16/2023 0514   CL 101  12/13/2023 0452   CO2 23 12/13/2023 0452   GLUCOSE 118 (H) 12/13/2023 0452   BUN 12 12/13/2023 0452   CREATININE 0.99 12/13/2023 0452   CALCIUM 8.5 (L) 12/13/2023 0452   PROT 7.0 12/06/2023 0240   ALBUMIN  2.9 (L) 12/06/2023 0240   AST 12 (L) 12/06/2023 0240   ALT 12 12/06/2023 0240   ALKPHOS 68 12/06/2023 0240   BILITOT 0.2 12/06/2023 0240   GFRNONAA >60 12/13/2023 0452   GFRAA >60 10/28/2019 0422   Lipase     Component Value Date/Time   LIPASE 25 12/05/2023 1014    Studies/Results: No results found.  Anti-infectives: Anti-infectives (From admission, onward)    Start     Dose/Rate Route Frequency Ordered Stop   12/05/23 1700  piperacillin -tazobactam (ZOSYN ) IVPB 3.375 g  Status:  Discontinued        3.375 g 12.5 mL/hr over 240 Minutes Intravenous Every 8 hours 12/05/23 1609 12/11/23 0930        Assessment/Plan POD#9 - s/p open VHR, LOA x 1 hour, explantation of mesh - Dr. Lyndel 12/08/23 - Soft diet as tolerated - ABL anemia post op. S/p 1U PRBC 8/12, 2U PRBC 8/14, 1U PRBC 8/19. Hgb with overall appropriate response after 1U (when comparing CBC's), 7.1 --> 8.0 - Continue JP drain - Continue to monitor midline wound. Will hold  on removing staples and continue to monitor.  - Reviewed area of LUQ with my attending. There is no overlying incision or signs of superficial infection. She denies trauma and this does not appear to be an area where she had subcutaneous injection per report. She did have explantation of intraperitoneal mesh. Hold on CT or further workup at this time.  - Mobilize, pulm toilet - Patient is okay for discharge from our standpoint. We will arrange follow up for staple removal, drain check and follow up with her surgeon. Will send pain medications to her pharmacy. Will provide a note for work. Discussed discharge instructions, restrictions and return/call back precautions. Will send a message to [facility] to let them know.    FEN - Soft, IVF per primary   VTE - SCDs, on hold for ABL anemia as above ID - Zosyn  completed per primary for TOA. Does not need abx from our standpoint. None currently. Afebrile. WBC wnl   AKI - Resolved. HTN - Per primary  TOA - Per GYN. Abx. They recommended 14d of abx and repeat ultrasound in 6-8wks during last admission    LOS: 12 days    Ozell CHRISTELLA Shaper, [facility] 12/17/2023, 9:38 AM Please see Amion for pager number during day hours 7:00am-4:30pm

## 2023-12-17 NOTE — Discharge Instructions (Signed)

## 2023-12-17 NOTE — Discharge Summary (Signed)
 Physician Discharge Summary  Victoria Chavez DOB: 11-06-1980 DOA: 12/05/2023  PCP: Victoria Crigler, MD  Admit date: 12/05/2023 Discharge date: 12/17/2023  Admitted From: Home Disposition: Home  Recommendations for Outpatient Follow-up:  Follow up with PCP in 1-2 weeks Follow-up with surgery as scheduled  Home Health: None Equipment/Devices: None  Discharge Condition: Stable CODE STATUS: Full code Diet recommendation: Low-salt low-fat low-carb diet  Brief/Interim Summary: Victoria Chavez is a 43 y.o. female with a history of hypertension, morbid obesity, sleep apnea, iron  deficiency anemia, ventral hernia status post mesh repair, left tubo-ovarian abscess.  Patient presented secondary to emesis and abdominal pain was found to have evidence of high-grade small bowel obstruction secondary to known ventral hernia.  General surgery was consulted and performed an open repair.  Hospitalization complicated by development of AKI and postoperative anemia.  Patient now improving status post ventral herniorrhaphy with lysis of adhesions on 8/11 with intraperitoneal mesh placement.  She is now approaching baseline, pain well-controlled, bowel movements noted without any further episodes of nausea vomiting abdominal pain.  AKI resolved, anemia stable likely acute blood loss on chronic iron  deficiency anemia.  Perioperative electrolytes and vitals now stabilizing and back to baseline.  Close follow-up outpatient with PCP and general surgery as scheduled in the next few weeks.  Otherwise stable and agreeable for discharge.  Discharge Diagnoses:  Principal Problem:   SBO (small bowel obstruction) [facility])  Discharge Instructions  Discharge Instructions     Call MD for:  difficulty breathing, headache or visual disturbances   Complete by: As directed    Call MD for:  extreme fatigue   Complete by: As directed    Call MD for:  hives   Complete by: As directed    Call MD for:  persistant  dizziness or light-headedness   Complete by: As directed    Call MD for:  persistant nausea and vomiting   Complete by: As directed    Call MD for:  redness, tenderness, or signs of infection (pain, swelling, redness, odor or Gobert/yellow discharge around incision site)   Complete by: As directed    Call MD for:  severe uncontrolled pain   Complete by: As directed    Call MD for:  temperature >100.4   Complete by: As directed    Diet - low sodium heart healthy   Complete by: As directed    Increase activity slowly   Complete by: As directed    No wound care   Complete by: As directed       Allergies as of 12/17/2023   No Known Allergies      Medication List     STOP taking these medications    amoxicillin -clavulanate 1000-62.5 MG 12 hr tablet Commonly known as: AUGMENTIN  XR       TAKE these medications    acetaminophen  500 MG tablet Commonly known as: TYLENOL  Take 1,000 mg by mouth 2 (two) times daily as needed for moderate pain (pain score 4-6), fever or headache.   albuterol  108 (90 Base) MCG/ACT inhaler Commonly known as: VENTOLIN  HFA Inhale 2 puffs into the lungs every 6 (six) hours as needed. For shortness of breath   amLODipine  5 MG tablet Commonly known as: NORVASC  Take 1 tablet (5 mg total) by mouth daily.   cyanocobalamin  1000 MCG/ML injection Commonly known as: VITAMIN B12 Inject 1 mL (1,000 mcg total) into the muscle once a week for 35 days, THEN 1 mL (1,000 mcg total) every 30 (thirty) days.  Start taking on: October 15, 2023   docusate sodium  100 MG capsule Commonly known as: COLACE Take 1 capsule (100 mg total) by mouth 2 (two) times daily.   ferrous sulfate 325 (65 FE) MG tablet Take 325 mg by mouth daily.   ibuprofen  800 MG tablet Commonly known as: ADVIL  Take 1 tablet (800 mg total) by mouth every 8 (eight) hours as needed for mild pain (pain score 1-3).   oxyCODONE  5 MG immediate release tablet Commonly known as: Oxy IR/ROXICODONE  Take 1  tablet (5 mg total) by mouth every 6 (six) hours as needed for breakthrough pain.   pantoprazole  40 MG tablet Commonly known as: PROTONIX  Take 1 tablet (40 mg total) by mouth daily.   polyethylene glycol 17 g packet Commonly known as: MIRALAX  / GLYCOLAX  Take 17 g by mouth daily as needed for mild constipation.   simethicone  80 MG chewable tablet Commonly known as: MYLICON Chew 1 tablet (80 mg total) by mouth 4 (four) times daily as needed for flatulence.   spironolactone  25 MG tablet Commonly known as: ALDACTONE  Take 25 mg by mouth daily.        Follow-up Information     Chavez, Victoria PARAS, MD Follow up on 12/25/2023.   Specialty: Surgery Why: 130pm. Please bring a copy of your photo ID, insurance card and arrive 30 minutes prior to your appointment for paperwork. Contact information: [address] [place] [address] [phone]         Surgery, [facility] Follow up on 12/22/2023.   Specialty: General Surgery Why: 2pm. This is a nurse visit for staple removal. Please bring a copy of your photo ID and insurance card. Contact information: [address] [place] [address] [phone]                No Known Allergies  Consultations: General Surgery  Procedures/Studies: US  RENAL Result Date: 12/09/2023 CLINICAL DATA:  Acute kidney injury EXAM: RENAL / URINARY TRACT ULTRASOUND COMPLETE COMPARISON:  CT 12/05/2023. FINDINGS: Right Kidney: Renal measurements: 12 x 5.9 x 5.3 cm = volume: 193.9 mL. Echogenicity within normal limits. No mass or hydronephrosis visualized. Left Kidney: Renal measurements: 9.9 x 4.4 x 4.9 cm = volume: 112.2 mL. Echogenicity within normal limits. No mass or hydronephrosis visualized. Bladder: Decompressed by Foley catheter Other: None. IMPRESSION: Negative renal ultrasound Electronically Signed   By: Luke Bun M.D.   On: 12/09/2023 16:16   DG Abd Portable 1V Result Date: 12/08/2023 CLINICAL  DATA:  Nasogastric tube placement. EXAM: PORTABLE ABDOMEN - 1 VIEW COMPARISON:  December 07, 2023 FINDINGS: A nasogastric tube is seen. While its distal end is looped within the body of the stomach, its distal tip is not included in the field of view. Low lung volumes are noted with mild atelectasis and/or infiltrate seen within the bilateral lung bases. The bowel gas pattern is normal. No radio-opaque calculi or other significant radiographic abnormality are seen. IMPRESSION: Nasogastric tube positioning, as described above. Electronically Signed   By: Suzen Dials M.D.   On: 12/08/2023 19:17   DG Abd 1 View Result Date: 12/08/2023 CLINICAL DATA:  [id number] SBO (small bowel obstruction) (HCC) [id number]. 8 hour fall through. EXAM: ABDOMEN - 1 VIEW COMPARISON:  X-ray abdomen 12/07/2023 12:18 p.m., x-ray abdomen 12/06/2023 FINDINGS: Enteric tube courses below the hemidiaphragm with tip and side port overlying the expected region of the gastric lumen. The enteric tube is looped once with excessive tubing noted overlying the  abdomen. Dilatation of several loops of small bowel with PO contrast. No radio-opaque calculi or other significant radiographic abnormality are seen. IMPRESSION: 1. Enteric tube in good position loop to once with excessive tubing. Recommend retracting by 15 cm. 2. Small-bowel obstruction. Several loops of small bowel dilated with PO contrast. Electronically Signed   By: Morgane  Naveau M.D.   On: 12/08/2023 00:26   DG Abd 1 View Result Date: 12/07/2023 CLINICAL DATA:  [id number] SBO (small bowel obstruction) (HCC) [id number] EXAM: ABDOMEN - 1 VIEW COMPARISON:  December 06, 2023 FINDINGS: Esophagogastric tube is coiled in the stomach, the tip is in the right mid abdomen, possibly in the proximal duodenum or gastric antrum. Generalized paucity of small bowel gas throughout the abdomen. No pneumoperitoneum. No organomegaly or radiopaque calculi. Multilevel thoracic osteophytosis. The lung bases are clear.  IMPRESSION: Esophagogastric tube is coiled in the stomach, with the tip in the right mid abdomen, possibly in the proximal duodenum or gastric antrum. Electronically Signed   By: Rogelia Myers M.D.   On: 12/07/2023 12:45   DG Abd Portable 1V Result Date: 12/06/2023 CLINICAL DATA:  Nasojejunal tube. EXAM: PORTABLE ABDOMEN - 1 VIEW COMPARISON:  None Available. FINDINGS: Nasal jejunal tube is not visualized in the neck, chest or abdomen. No dilated bowel loops are seen. Lungs are is clear. IMPRESSION: Nasal jejunal tube is not visualized in the neck, chest or abdomen. Please correlate clinically. Electronically Signed   By: Greig Pique M.D.   On: 12/06/2023 22:51   DG Chest Port 1 View Result Date: 12/06/2023 CLINICAL DATA:  Nasogastric tube placement EXAM: PORTABLE CHEST 1 VIEW COMPARISON:  None Available. FINDINGS: The heart size and mediastinal contours are within normal limits. Both lungs are clear. The visualized skeletal structures are unremarkable. No nasogastric tube is identified within the visualized thorax. IMPRESSION: 1. No active disease. No nasogastric tube identified within the visualized thorax. Electronically Signed   By: Dorethia Molt M.D.   On: 12/06/2023 22:29   DG Abd Portable 1V-Small Bowel Obstruction Protocol-initial, 8 hr delay Result Date: 12/06/2023 CLINICAL DATA:  8 hour delay for small bowel obstruction protocol. EXAM: PORTABLE ABDOMEN - 1 VIEW COMPARISON:  Abdominal x-ray 12/05/2023. CT abdomen and pelvis 12/05/2023. FINDINGS: There is minimal oral contrast seen within the stomach. No other significant oral contrast identified throughout the abdomen. There are dilated air-filled loops of small bowel in the left upper quadrant measuring 4.6 cm similar to prior. No suspicious calcifications. No acute osseous abnormality. IMPRESSION: 1. Minimal oral contrast seen within the stomach. No other significant oral contrast identified throughout the abdomen. 2. Persistent dilated  air-filled loops of small bowel in the left upper quadrant. Findings are concerning for persistent small bowel obstruction. Electronically Signed   By: Greig Pique M.D.   On: 12/06/2023 22:27   DG Abd 1 View Result Date: 12/05/2023 CLINICAL DATA:  Nasogastric tube EXAM: ABDOMEN - 1 VIEW COMPARISON:  Abdominal x-ray 11/23/2023 FINDINGS: Nasogastric tube tip is in the body of the stomach. IMPRESSION: Nasogastric tube tip is in the body of the stomach. Electronically Signed   By: Greig Pique M.D.   On: 12/05/2023 18:27   CT ABDOMEN PELVIS W CONTRAST Result Date: 12/05/2023 CLINICAL DATA:  Hernia suspected, abdominal wall. Complaining of abdominal pain. EXAM: CT ABDOMEN AND PELVIS WITH CONTRAST TECHNIQUE: Multidetector CT imaging of the abdomen and pelvis was performed using the standard protocol following bolus administration of intravenous contrast. RADIATION DOSE REDUCTION: This exam was performed according to the  departmental dose-optimization program which includes automated exposure control, adjustment of the mA and/or kV according to patient size and/or use of iterative reconstruction technique. CONTRAST:  75mL OMNIPAQUE  IOHEXOL  350 MG/ML SOLN COMPARISON:  CT abdomen pelvis 11/22/2023 FINDINGS: Lower chest: Mild volume loss at the left lung base. Otherwise, the visualized lung bases are clear. Hepatobiliary: Multiple gallstones. No evidence for gallbladder inflammation. Normal appearance of the liver. No biliary dilatation. Pancreas: Unremarkable. No pancreatic ductal dilatation or surrounding inflammatory changes. Spleen: Normal in size without focal abnormality. Adrenals/Urinary Tract: Adrenal glands are within normal limits. Normal appearance of the right kidney without stones or hydronephrosis. Calcifications in the posterior left kidney interpolar region. These may represent renal calculi. No hydronephrosis. No suspicious renal lesions. No acute abnormality to the urinary bladder. Stomach/Bowel: 2  large ventral hernias containing loops of bowel. Transverse colon is within the more superior ventral hernia. The lower right paracentral ventral hernia contains the descending colon and distal small bowel. There are new dilated loops of small bowel within the hernia sac with a transition point. In addition, there are dilated loops of small bowel within the intra-abdominal cavity. Small bowel dilatation is new. Small bowel loops measure up to 4.6 cm on image 51/3. Findings are suggestive for a high-grade small bowel obstruction. Normal appearance of the stomach without gastric distension. Mesenteric edema throughout the abdomen. There is probably a small amount of fluid within the lower large ventral hernia. Vascular/Lymphatic: Atherosclerotic disease involving the iliac arteries. Negative for an abdominal aortic aneurysm. Mildly prominent periaortic and retroperitoneal lymph nodes are similar to the comparison examination. Overall, there is no significant lymphadenopathy in the abdomen or pelvis. Reproductive: Again noted are low-density collections posterior to the uterus. These fluid collections are complex. The largest component on image 69/3 measures approximately 4.9 x 2.8 cm and measured 8.6 x 6.0 cm on 11/22/2023. Additional complex collections near the left adnexa. Based on previous imaging, this may represent decreased size of tubo-ovarian abscess. Overall, these collections appear to be smaller. Other: There appears to be a small amount of fluid in the dependent aspect of the large ventral hernia associated with the small bowel obstruction. Presacral edema. No significant fluid in the pelvis. No evidence for extraluminal air. Musculoskeletal: No acute bone abnormality. IMPRESSION: 1. High-grade small bowel obstruction with a transition point within the large inferior ventral hernia. Small amount of free fluid within the large ventral hernia sac. Mild mesenteric edema. 2. Two large ventral hernias. The  superior ventral hernia is near the midline and contains a portion of the transverse colon. The larger inferior right paracentral ventral hernia contains both small and large bowel and there is evidence for a small bowel obstruction and transition point within the large ventral hernia sac. 3. Complex pelvic fluid collections have decreased in size since 11/22/2023. Findings may represent a resolving or improving tubo-ovarian abscess. 4. Cholelithiasis. 5. Indeterminate left renal calcifications.  No hydronephrosis. These results were called by telephone at the time of interpretation on 12/05/2023 at 1:15 pm to provider Dr. Francesca, Who verbally acknowledged these results. Electronically Signed   By: Juliene Balder M.D.   On: 12/05/2023 13:26   DG Abd 2 Views Result Date: 11/23/2023 CLINICAL DATA:  [id number] Ventral incisional hernia [id number] Colonic obstruction (HCC) [id number] EXAM: ABDOMEN - 2 VIEW COMPARISON:  10/27/2019 FINDINGS: Visualized lung bases clear. No free air. Nonobstructive bowel gas pattern. Bowel loops project over the proximal right femur and lateral to the right hemipelvis suggesting large hernia. No  abnormal abdominal calcifications. Regional bones unremarkable. IMPRESSION: 1. Nonobstructive bowel gas pattern. 2. Probable large right ventral or spigelian hernia. Electronically Signed   By: JONETTA Faes M.D.   On: 11/23/2023 14:21   US  PELVIC COMPLETE W TRANSVAGINAL AND TORSION R/O Result Date: 11/22/2023 CLINICAL DATA:  Pain EXAM: TRANSABDOMINAL AND TRANSVAGINAL ULTRASOUND OF PELVIS TECHNIQUE: Both transabdominal and transvaginal ultrasound examinations of the pelvis were performed. Transabdominal technique was performed for global imaging of the pelvis including uterus, ovaries, adnexal regions, and pelvic cul-de-sac. It was necessary to proceed with endovaginal exam following the transabdominal exam to visualize the ovaries. COMPARISON:  CT abdomen and pelvis 11/22/2023. FINDINGS: Uterus  Measurements: 7.3 x 2.7 x 3.5 cm = volume: 35 mL. Limited evaluation. No fibroids or other mass visualized. Endometrium Thickness: 9.5 mm.  No focal abnormality visualized. Right ovary Not visualized. Left ovary Measurements: 4.4 x 3.9 x 2.4 cm = volume: 21 mL. Complex low-attenuation mass containing septations and internal debris seen in the left adnexa/left paraovarian region measuring 7.9 x 6.9 x 6.3 cm. Vascular flow is identified in the left ovary. Other findings No abnormal free fluid. IMPRESSION: 1. Complex low-attenuation mass containing septations and internal debris seen in the left adnexa/left paraovarian region measuring 7.9 x 6.9 x 6.3 cm. Findings are indeterminate and may represent tubo-ovarian abscess, although endometrioma and complex or hemorrhagic cysts are also in the differential. 2. Right ovary not visualized. Electronically Signed   By: Greig Pique M.D.   On: 11/22/2023 17:43   CT ABDOMEN PELVIS W CONTRAST Result Date: 11/22/2023 CLINICAL DATA:  Bowel obstruction suspected Hernia suspected, abdominal wall. No bowel movement in 4 days. Vomiting. EXAM: CT ABDOMEN AND PELVIS WITH CONTRAST TECHNIQUE: Multidetector CT imaging of the abdomen and pelvis was performed using the standard protocol following bolus administration of intravenous contrast. RADIATION DOSE REDUCTION: This exam was performed according to the departmental dose-optimization program which includes automated exposure control, adjustment of the mA and/or kV according to patient size and/or use of iterative reconstruction technique. CONTRAST:  OMNIPAQUE  IOHEXOL  350 MG/ML SOLN COMPARISON:  08/12/2023 FINDINGS: Lower chest: No acute findings Hepatobiliary: Multiple gallstones within the gallbladder. No CT evidence of acute cholecystitis. No focal hepatic abnormality. Pancreas: No focal abnormality or ductal dilatation. Spleen: No focal abnormality.  Normal size. Adrenals/Urinary Tract: Adrenal glands normal. 10 mm left  lower pole nonobstructing renal stone. No ureteral stones or hydronephrosis. Urinary bladder decompressed, grossly unremarkable. Stomach/Bowel: Left colonic diverticulosis. No active diverticulitis. Stomach and small bowel decompressed. Supra umbilical ventral hernia present containing transverse colon. There is a large umbilical hernia which contains transverse colon and right colon as well as numerous small bowel loops. No evidence of bowel obstruction. Vascular/Lymphatic: No aneurysm. Shotty retroperitoneal lymph nodes have increased since prior study, with short axis diameter measuring up to 9 mm. These are likely reactive. Reproductive: Large complex fluid collection noted in the pelvis which appears to be posterior to the uterus in the cul-de-sac. This entire complex area measures 13 x 8 cm. This is concerning for abscess. Neither ovary definitively visualized. Other: No free fluid or free air. Musculoskeletal: No acute bony abnormality. IMPRESSION: Large complex fluid collection in the cul-de-sac of the pelvis measuring 13 x 8 cm, new since prior study. This is concerning for tubo-ovarian abscess. Moderate-sized supraumbilical ventral hernia and large umbilical ventral hernia. Hernias contain right colon, transverse colon and numerous small bowel loops. No evidence of bowel obstruction. Cholelithiasis. Shotty retroperitoneal adenopathy, likely reactive. Electronically Signed   By:  Franky Crease M.D.   On: 11/22/2023 15:16     Subjective: No acute issues or events overnight denies nausea vomit diarrhea constipation a few chills chest pain   Discharge Exam: Vitals:   12/17/23 0723 12/17/23 1222  BP: (!) 123/96 (!) 139/96  Pulse:    Resp:    Temp: 98.2 F (36.8 C) 98 F (36.7 C)  SpO2: 95% 95%   Vitals:   12/16/23 2308 12/17/23 0419 12/17/23 0723 12/17/23 1222  BP: 118/74 118/76 (!) 123/96 (!) 139/96  Pulse: (!) 107 (!) 109    Resp: 18 20    Temp: 98.5 F (36.9 C) 98.3 F (36.8 C) 98.2 F  (36.8 C) 98 F (36.7 C)  TempSrc: Oral Oral Oral Oral  SpO2: 98% 100% 95% 95%  Weight:      Height:        General: Pt is alert, awake, not in acute distress Cardiovascular: RRR, S1/S2 +, no rubs, no gallops Respiratory: CTA bilaterally, no wheezing, no rhonchi Abdominal: Soft, NT, ND, dressing/bandage clean dry intact Extremities: no edema, no cyanosis    The results of significant diagnostics from this hospitalization (including imaging, microbiology, ancillary and laboratory) are listed below for reference.     Microbiology: No results found for this or any previous visit (from the past 240 hours).   Labs: BNP (last 3 results) No results for input(s): BNP in the last 8760 hours. Basic Metabolic Panel: Recent Labs  Lab 12/10/23 1427 12/11/23 0507 12/12/23 0530 12/13/23 0452 12/13/23 0830 12/14/23 1102 12/16/23 0514  NA 139 135 137 135  --   --   --   K 3.7 3.4* 3.7 3.0*  --  3.5 3.3*  CL 99 101 101 101  --   --   --   CO2 25 23 25 23   --   --   --   GLUCOSE 110* 87 135* 118*  --   --   --   BUN 28* 25* 21* 12  --   --   --   CREATININE 3.77* 2.57* 1.58* 0.99  --   --   --   CALCIUM 8.4* 8.1* 8.3* 8.5*  --   --   --   MG 1.9  --   --   --  1.9  --  1.6*   CBC: Recent Labs  Lab 12/13/23 0452 12/14/23 0433 12/15/23 0704 12/16/23 0514 12/16/23 1952 12/17/23 0626  WBC 10.5 12.1* 9.2 8.8  --  10.1  HGB 7.3* 7.2* 7.2* 7.1* 8.5* 8.0*  HCT 23.9* 23.6* 23.6* 23.5* 28.2* 26.2*  MCV 79.4* 80.0 80.3 80.8  --  81.6  PLT 408* 481* 479* 504*  --  498*   Urinalysis    Component Value Date/Time   COLORURINE YELLOW 12/10/2023 1545   APPEARANCEUR TURBID (A) 12/10/2023 1545   LABSPEC 1.023 12/10/2023 1545   PHURINE 5.0 12/10/2023 1545   GLUCOSEU NEGATIVE 12/10/2023 1545   HGBUR MODERATE (A) 12/10/2023 1545   BILIRUBINUR NEGATIVE 12/10/2023 1545   KETONESUR 5 (A) 12/10/2023 1545   PROTEINUR 100 (A) 12/10/2023 1545   NITRITE NEGATIVE 12/10/2023 1545    LEUKOCYTESUR NEGATIVE 12/10/2023 1545   Sepsis Labs Recent Labs  Lab 12/14/23 0433 12/15/23 0704 12/16/23 0514 12/17/23 0626  WBC 12.1* 9.2 8.8 10.1   Microbiology No results found for this or any previous visit (from the past 240 hours).   Time coordinating discharge: Over 30 minutes  SIGNED:   Elsie JAYSON [place], DO [facility] 12/17/2023, 1:40 PM Pager   If 7PM-7AM, please contact night-coverage [email or web address]

## 2023-12-22 NOTE — Progress Notes (Signed)
 Pt presented today for removal of staples from abdomen. Had Open ventral hernia repair , Lysis of adhesions (1 hour), Explantation of intraperitoneal mesh on 12/08/23 by Dr. Leonor. No signs of infection. Pt did have visible dark red drainage from incision which possible is from hematoma. Otherwise no other concerns for infection or foul smelling odor. Pt with no complaints.  Due to incision being partially opened had Puja to take a look and also check drain to see if this needed to be removed.  Incision was prepped with ChloraPrep. All staples removed successfully, and pt tolerated well. No steri-strips applied since most of incision was moist and partially opening.  Pt was already showering per her report.  Puja confirmed that drain to stay in place until her visit with Dr. Lyndel on 8/28.  She will continue to monitor output.  Puja also made pt aware that should wound start to open up to contact office for another nurse visit to teach and do wound packing. Pt verbalized understanding.

## 2023-12-23 NOTE — Progress Notes (Signed)
 Pt presented for a wound check today. Puja removed the dressing and then packed with a 4x4 with saline. A dry dressing was applied. Informed the pt they should keep the outer dressing over the area for the remainder of today. Pt advised by Puja to not shower or remove dressing and packing until she has a follow up appointment on 12/25/23 with Dr. Lyndel. Let pt know to keep the skin dry around the wound to prevent breakdown and irritation and to avoid use of antibiotic ointments for more than a week as they can slow wound healing over time. They will keep an eye out for any signs of infection like increased redness, foul smelling drainage, swelling, major pain, or fever/chills. Told pt to call the office if it does not improve or worsens. Pt verbalized understanding.

## 2023-12-25 ENCOUNTER — Inpatient Hospital Stay (HOSPITAL_COMMUNITY)
Admission: AD | Admit: 2023-12-25 | Discharge: 2023-12-30 | DRG: 908 | Disposition: A | Discharge status: Home Health | Source: Ambulatory Visit | Attending: General Surgery | Admitting: General Surgery

## 2023-12-25 ENCOUNTER — Encounter (HOSPITAL_COMMUNITY): Payer: Self-pay

## 2023-12-25 DIAGNOSIS — M96841 Postprocedural hematoma of a musculoskeletal structure following other procedure: Principal | ICD-10-CM | POA: Diagnosis present

## 2023-12-25 DIAGNOSIS — Z79899 Other long term (current) drug therapy: Secondary | ICD-10-CM

## 2023-12-25 DIAGNOSIS — Z87891 Personal history of nicotine dependence: Secondary | ICD-10-CM

## 2023-12-25 DIAGNOSIS — I10 Essential (primary) hypertension: Secondary | ICD-10-CM | POA: Diagnosis present

## 2023-12-25 DIAGNOSIS — G4730 Sleep apnea, unspecified: Secondary | ICD-10-CM | POA: Diagnosis present

## 2023-12-25 DIAGNOSIS — E66813 Obesity, class 3: Secondary | ICD-10-CM | POA: Diagnosis present

## 2023-12-25 DIAGNOSIS — Y838 Other surgical procedures as the cause of abnormal reaction of the patient, or of later complication, without mention of misadventure at the time of the procedure: Secondary | ICD-10-CM | POA: Diagnosis present

## 2023-12-25 DIAGNOSIS — S301XXA Contusion of abdominal wall, initial encounter: Secondary | ICD-10-CM | POA: Diagnosis not present

## 2023-12-25 DIAGNOSIS — Z6841 Body Mass Index (BMI) 40.0 and over, adult: Secondary | ICD-10-CM | POA: Diagnosis not present

## 2023-12-25 MED ORDER — DIPHENHYDRAMINE HCL 50 MG/ML IJ SOLN: 25.0000 mg | Freq: Four times a day (QID) | INTRAMUSCULAR | Status: DC | PRN

## 2023-12-25 MED ORDER — SIMETHICONE 80 MG PO CHEW
40.0000 mg | CHEWABLE_TABLET | Freq: Four times a day (QID) | ORAL | Status: DC | PRN
  Administered 2023-12-28: 40 mg via ORAL
  Filled 2023-12-25: qty 1

## 2023-12-25 MED ORDER — ACETAMINOPHEN 500 MG PO TABS
1000.0000 mg | ORAL_TABLET | Freq: Four times a day (QID) | ORAL | Status: DC
  Administered 2023-12-25 – 2023-12-30 (×16): 1000 mg via ORAL
  Filled 2023-12-25 (×18): qty 2

## 2023-12-25 MED ORDER — ORAL CARE MOUTH RINSE: 15.0000 mL | OROMUCOSAL | Status: DC | PRN

## 2023-12-25 MED ORDER — DIPHENHYDRAMINE HCL 25 MG PO CAPS: 25.0000 mg | ORAL_CAPSULE | Freq: Four times a day (QID) | ORAL | Status: DC | PRN

## 2023-12-25 MED ORDER — TRAMADOL HCL 50 MG PO TABS
50.0000 mg | ORAL_TABLET | Freq: Four times a day (QID) | ORAL | Status: DC | PRN
  Administered 2023-12-25 – 2023-12-27 (×3): 50 mg via ORAL
  Filled 2023-12-25 (×4): qty 1

## 2023-12-25 MED ORDER — ONDANSETRON 4 MG PO TBDP
4.0000 mg | ORAL_TABLET | Freq: Four times a day (QID) | ORAL | Status: DC | PRN
Start: 2023-12-25 — End: 2023-12-30

## 2023-12-25 MED ORDER — ONDANSETRON HCL 4 MG/2ML IJ SOLN: 4.0000 mg | Freq: Four times a day (QID) | INTRAMUSCULAR | Status: DC | PRN

## 2023-12-25 NOTE — Progress Notes (Signed)
 Abdominal wound dressing saturated, dressing removed and wound cleansed with NS. Wound packed with NS wet to dry dressing and covered with 4x4s and ABD pad.

## 2023-12-25 NOTE — Progress Notes (Signed)
   PROVIDER:  DEWARD PURCHASE STECHSCHULTE, MD  MRN: [id number] DOB: 01-10-1981 DATE OF ENCOUNTER: 12/25/2023 Interval History:      History of Present Illness Victoria Chavez is a 43 year old female who presents with postoperative wound drainage and pain following hernia surgery.  She experiences significant pain and drainage from her surgical site following a recent hernia repair. The drainage, described as dark blood with a thick consistency resembling 'old motor oil', has been persistent since the removal of her surgical staples two days ago. Approximately 25 ml of blood drains from the site, which is located where the hernia was, underneath the skin.  The drainage began the day after the staples were removed. She has been independently managing the wound care by changing her bandages and using tape to secure them, as her healthcare aide was unavailable due to an emergency.  She is eating, drinking, and having bowel movements, although she describes the process as slow. She denies being on any blood thinners but mentions being given some medications post-surgery. She has been using testosterone patches, applying ten patches recently, which she believes has helped with the drainage.  During the review of symptoms, she reports significant pain, especially when pressure is applied to the top of the wound. She attributes any odor to the blood itself.      Physical Examination:   Physical Exam     Physical Exam ABDOMEN: No signs of infection.  Lower half of wound opened..  Large amount of cloted and liquified hematoma remains in wound - difficult to clear wound on exam   Assessment and Plan:     Victoria Chavez is a 43 y.o. female who underwent open incisional hernia repair on 12/08/23.  Diagnoses and all orders for this visit:  Postoperative state     Wound has opened after staple removal with large hematoma unable to be fully evacuated from wound.  Recommend readmission to the hospital  with wound exploration and likely wound vac placement.  Will direct admit patient to emergency general surgery service and schedule for surgery tomorrow.  Discussed with Dr. Paola.   This note has been created using automated tools and reviewed for accuracy by PAUL JEFFREY STECHSCHULTE.   The plan was discussed in detail with the patient today, who expressed understanding.  The patient has my contact information, and understands to call me with any additional questions or concerns in the interval.  I would be happy to see the patient back sooner if the need arises.   PAUL JEFFREY STECHSCHULTE, MD

## 2023-12-25 NOTE — H&P (Signed)
   Reason for Consult:abdominal wall hematoma  Victoria Chavez is an 43 y.o. female.  HPI: 43 yo female underwent giant hernia repair with bowel obstruction. Post op she had a large hematoma and received multiple blood transfusions. She presented to the office today and her wound was open with large amount of hematoma coming out the wound. She was direct admitted in order to undergo procedure tomorrow to remove hematoma, stop any bleeding, and place wound vac  Past Medical History:  Diagnosis Date   Abdominal hernia    Dyspnea    albuterol  inhaler prn   HTN (hypertension), benign    IDA (iron  deficiency anemia) 10/02/2023   Morbid obesity (HCC)    Sleep apnea 2007   does not use CPAP    Past Surgical History:  Procedure Laterality Date   HERNIA REPAIR  2013   with mesh   HYSTEROSCOPY WITH D & C N/A 10/08/2023   Procedure: DILATATION AND CURETTAGE /HYSTEROSCOPY;  Surgeon: Rosalva Sawyer, MD;  Location: [facility] OR;  Service: Gynecology;  Laterality: N/A;   INCISIONAL HERNIA REPAIR N/A 08/12/2019   Procedure: Recurrent incisional hernia repair;  Surgeon: Rubin Calamity, MD;  Location: [facility] OR;  Service: General;  Laterality: N/A;   LAPAROTOMY N/A 08/12/2019   Procedure: EXPLORATORY LAPAROTOMY;  Surgeon: Rubin Calamity, MD;  Location: [facility] OR;  Service: General;  Laterality: N/A;   MELINDA RESECTION N/A 10/08/2023   Procedure: MELINDA RESECTION POLYPECTOMY;  Surgeon: Rosalva Sawyer, MD;  Location: [facility] OR;  Service: Gynecology;  Laterality: N/A;   OMENTECTOMY N/A 08/12/2019   Procedure: Partial Omentectomy;  Surgeon: Rubin Calamity, MD;  Location: [facility] OR;  Service: General;  Laterality: N/A;   VENTRAL HERNIA REPAIR N/A 12/08/2023   Procedure: Exploratory Laparotomy, EXTENSIVE LYSIS OF ADHESIONS , EXPLANTATION OF MESH, Repair of HERNIA, VENTRAL;  Surgeon: Lyndel Deward PARAS, MD;  Location: [facility] OR;  Service: General;  Laterality: N/A;    No family history on file.  Social History:  reports that  she has quit smoking. Her smoking use included cigarettes. She has never used smokeless tobacco. She reports current alcohol use of about 1.0 - 2.0 standard drink of alcohol per week. She reports current drug use. Frequency: 14.00 times per week. Drug: Marijuana.  Allergies: No Known Allergies  Medications: I have reviewed the patient's current medications.  No results found for this or any previous visit (from the past 48 hours).  PE Blood pressure (!) 149/82, pulse 93, temperature 98.4 F (36.9 C), resp. rate 18, SpO2 100%. Constitutional: NAD; conversant; no deformities Eyes: Moist conjunctiva; no lid lag; anicteric; PERRL Neck: Trachea midline; no thyromegaly Lungs: Normal respiratory effort; no tactile fremitus CV: RRR; no palpable thrills; no pitting edema GI: Abd wound open with large amount of hematoma; no palpable hepatosplenomegaly MSK: Normal gait; no clubbing/cyanosis Psychiatric: Appropriate affect; alert and oriented x3 Lymphatic: No palpable cervical or axillary lymphadenopathy Skin: No major subcutaneous nodules. Warm and dry   Assessment/Plan: 43 yo female with large hematoma and giant hernia repair -CT scan tonight -NPO tomorrow 0500 for abdominal wall washout and placement of vac -pain control  I reviewed last 24 h vitals and pain scores, last 48 h intake and output, last 24 h labs and trends, and last 24 h imaging results.  This care required high  level of medical decision making.   Herlene Righter Sahory Nordling 12/25/2023, 6:41 PM

## 2023-12-25 NOTE — Progress Notes (Signed)
 Transition of Care [facility]) - Inpatient Brief Assessment   Patient Details  Name: Victoria Chavez MRN: [id number] Date of Birth: 26-Sep-1980  Transition of Care [facility] CM/SW Contact:    Rosaline JONELLE Joe, RN Phone Number: 12/25/2023, 3:49 PM   Clinical Narrative: Patient admitted for nausea, vomiting and abdominal pain.  Per MD notes - CT revealed SBO with large ventral hernia.  CCS was consulted.  No IP Care management needs at this time.   Transition of Care Asessment: Insurance and Status: (P) Insurance coverage has been reviewed Patient has primary care physician: (P) Yes Home environment has been reviewed: (P) from home Prior level of function:: (P) self Prior/Current Home Services: (P) No current home services Social Drivers of Health Review: (P) SDOH reviewed no interventions necessary Readmission risk has been reviewed: (P) Yes Transition of care needs: (P) no transition of care needs at this time

## 2023-12-26 ENCOUNTER — Inpatient Hospital Stay (HOSPITAL_COMMUNITY): Admitting: Certified Registered Nurse Anesthetist

## 2023-12-26 ENCOUNTER — Inpatient Hospital Stay (HOSPITAL_COMMUNITY)

## 2023-12-26 ENCOUNTER — Encounter (HOSPITAL_COMMUNITY): Admission: AD | Disposition: A | Discharge status: Home Health | Payer: Self-pay | Source: Ambulatory Visit

## 2023-12-26 ENCOUNTER — Encounter (HOSPITAL_COMMUNITY): Payer: Self-pay

## 2023-12-26 ENCOUNTER — Other Ambulatory Visit: Payer: Self-pay

## 2023-12-26 DIAGNOSIS — E66813 Obesity, class 3: Secondary | ICD-10-CM

## 2023-12-26 DIAGNOSIS — Z6841 Body Mass Index (BMI) 40.0 and over, adult: Secondary | ICD-10-CM

## 2023-12-26 DIAGNOSIS — I10 Essential (primary) hypertension: Secondary | ICD-10-CM | POA: Diagnosis not present

## 2023-12-26 DIAGNOSIS — S301XXA Contusion of abdominal wall, initial encounter: Secondary | ICD-10-CM

## 2023-12-26 HISTORY — PX: DEBRIDEMENT OF ABDOMINAL WALL ABSCESS: SHX6396

## 2023-12-26 HISTORY — PX: APPLICATION OF WOUND VAC: SHX5189

## 2023-12-26 LAB — CBC
HCT: 26.3 % — ABNORMAL LOW (ref 36.0–46.0)
Hemoglobin: 7.9 g/dL — ABNORMAL LOW (ref 12.0–15.0)
MCH: 23.9 pg — ABNORMAL LOW (ref 26.0–34.0)
MCHC: 30 g/dL (ref 30.0–36.0)
MCV: 79.5 fL — ABNORMAL LOW (ref 80.0–100.0)
Platelets: 462 K/uL — ABNORMAL HIGH (ref 150–400)
RBC: 3.31 MIL/uL — ABNORMAL LOW (ref 3.87–5.11)
RDW: 18.4 % — ABNORMAL HIGH (ref 11.5–15.5)
WBC: 7.9 K/uL (ref 4.0–10.5)
nRBC: 0 % (ref 0.0–0.2)

## 2023-12-26 LAB — BASIC METABOLIC PANEL WITH GFR
Anion gap: 8 (ref 5–15)
BUN: <5 mg/dL — ABNORMAL LOW (ref 6–20)
CO2: 25 mmol/L (ref 22–32)
Calcium: 8.4 mg/dL — ABNORMAL LOW (ref 8.9–10.3)
Chloride: 104 mmol/L (ref 98–111)
Creatinine, Ser: 0.63 mg/dL (ref 0.44–1.00)
GFR, Estimated: >60 mL/min (ref >60)
Glucose, Bld: 115 mg/dL — ABNORMAL HIGH (ref 70–99)
Potassium: 3.3 mmol/L — ABNORMAL LOW (ref 3.5–5.1)
Sodium: 137 mmol/L (ref 135–145)

## 2023-12-26 LAB — PREGNANCY, URINE: Preg Test, Ur: NEGATIVE

## 2023-12-26 LAB — TYPE AND SCREEN
ABO/RH(D): O POS
Antibody Screen: NEGATIVE

## 2023-12-26 SURGERY — DEBRIDEMENT OF ABDOMINAL WALL ABSCESS
Anesthesia: General

## 2023-12-26 MED ORDER — FENTANYL CITRATE (PF) 250 MCG/5ML IJ SOLN
INTRAMUSCULAR | Status: AC
  Filled 2023-12-26: qty 5

## 2023-12-26 MED ORDER — PROPOFOL 10 MG/ML IV BOLUS
INTRAVENOUS | Status: AC
  Filled 2023-12-26: qty 20

## 2023-12-26 MED ORDER — SUGAMMADEX SODIUM 200 MG/2ML IV SOLN
INTRAVENOUS | Status: DC | PRN
  Administered 2023-12-26: 400 mg via INTRAVENOUS

## 2023-12-26 MED ORDER — LACTATED RINGERS IV SOLN: INTRAVENOUS | Status: DC

## 2023-12-26 MED ORDER — MIDAZOLAM HCL 2 MG/2ML IJ SOLN
INTRAMUSCULAR | Status: AC
  Filled 2023-12-26: qty 2

## 2023-12-26 MED ORDER — CHLORHEXIDINE GLUCONATE 0.12 % MT SOLN: 15.0000 mL | Freq: Once | OROMUCOSAL | Status: AC

## 2023-12-26 MED ORDER — CEFAZOLIN SODIUM 10 G IJ SOLR
INTRAMUSCULAR | Status: DC | PRN
  Administered 2023-12-26: 3 g via INTRAVENOUS

## 2023-12-26 MED ORDER — CEFAZOLIN SODIUM 1 G IJ SOLR
INTRAMUSCULAR | Status: AC
  Filled 2023-12-26: qty 10

## 2023-12-26 MED ORDER — LIDOCAINE 2% (20 MG/ML) 5 ML SYRINGE
INTRAMUSCULAR | Status: DC | PRN
  Administered 2023-12-26: 100 mg via INTRAVENOUS

## 2023-12-26 MED ORDER — PHENYLEPHRINE 80 MCG/ML (10ML) SYRINGE FOR IV PUSH (FOR BLOOD PRESSURE SUPPORT)
PREFILLED_SYRINGE | INTRAVENOUS | Status: DC | PRN
  Administered 2023-12-26: 160 ug via INTRAVENOUS

## 2023-12-26 MED ORDER — LACTATED RINGERS IV SOLN
INTRAVENOUS | Status: DC | PRN
Start: 2023-12-26 — End: 2023-12-26

## 2023-12-26 MED ORDER — DEXAMETHASONE SODIUM PHOSPHATE 10 MG/ML IJ SOLN
INTRAMUSCULAR | Status: DC | PRN
  Administered 2023-12-26: 10 mg via INTRAVENOUS

## 2023-12-26 MED ORDER — FENTANYL CITRATE (PF) 250 MCG/5ML IJ SOLN
INTRAMUSCULAR | Status: DC | PRN
  Administered 2023-12-26: 100 ug via INTRAVENOUS
  Administered 2023-12-26 (×2): 50 ug via INTRAVENOUS

## 2023-12-26 MED ORDER — SODIUM CHLORIDE 0.9 % IV SOLN: INTRAVENOUS | Status: DC | PRN

## 2023-12-26 MED ORDER — LIDOCAINE 2% (20 MG/ML) 5 ML SYRINGE
INTRAMUSCULAR | Status: AC
  Filled 2023-12-26: qty 5

## 2023-12-26 MED ORDER — ONDANSETRON HCL 4 MG/2ML IJ SOLN
INTRAMUSCULAR | Status: DC | PRN
  Administered 2023-12-26: 4 mg via INTRAVENOUS

## 2023-12-26 MED ORDER — MIDAZOLAM HCL 5 MG/5ML IJ SOLN
INTRAMUSCULAR | Status: DC | PRN
  Administered 2023-12-26: 2 mg via INTRAVENOUS

## 2023-12-26 MED ORDER — CHLORHEXIDINE GLUCONATE 0.12 % MT SOLN
OROMUCOSAL | Status: AC
  Filled 2023-12-26: qty 15

## 2023-12-26 MED ORDER — ONDANSETRON HCL 4 MG/2ML IJ SOLN
INTRAMUSCULAR | Status: AC
  Filled 2023-12-26: qty 2

## 2023-12-26 MED ORDER — ORAL CARE MOUTH RINSE
15.0000 mL | Freq: Once | OROMUCOSAL | Status: AC
  Administered 2023-12-26: 15 mL via OROMUCOSAL

## 2023-12-26 MED ORDER — CHLORHEXIDINE GLUCONATE 0.12 % MT SOLN
OROMUCOSAL | Status: AC
  Administered 2023-12-26: 15 mL via OROMUCOSAL
  Filled 2023-12-26: qty 15

## 2023-12-26 MED ORDER — ROCURONIUM BROMIDE 10 MG/ML (PF) SYRINGE
PREFILLED_SYRINGE | INTRAVENOUS | Status: AC
  Filled 2023-12-26: qty 10

## 2023-12-26 MED ORDER — DEXAMETHASONE SODIUM PHOSPHATE 10 MG/ML IJ SOLN
INTRAMUSCULAR | Status: AC
  Filled 2023-12-26: qty 1

## 2023-12-26 MED ORDER — PHENYLEPHRINE 80 MCG/ML (10ML) SYRINGE FOR IV PUSH (FOR BLOOD PRESSURE SUPPORT)
PREFILLED_SYRINGE | INTRAVENOUS | Status: AC
  Filled 2023-12-26: qty 10

## 2023-12-26 MED ORDER — ORAL CARE MOUTH RINSE: 15.0000 mL | Freq: Once | OROMUCOSAL | Status: AC

## 2023-12-26 MED ORDER — ROCURONIUM BROMIDE 10 MG/ML (PF) SYRINGE
PREFILLED_SYRINGE | INTRAVENOUS | Status: DC | PRN
  Administered 2023-12-26: 50 mg via INTRAVENOUS

## 2023-12-26 MED ORDER — PROPOFOL 10 MG/ML IV BOLUS
INTRAVENOUS | Status: DC | PRN
  Administered 2023-12-26: 150 mg via INTRAVENOUS

## 2023-12-26 SURGICAL SUPPLY — 25 items
BAG COUNTER SPONGE SURGICOUNT (BAG) ×2 IMPLANT
BNDG GAUZE DERMACEA FLUFF 4 (GAUZE/BANDAGES/DRESSINGS) IMPLANT
CANISTER SUCTION 3000ML PPV (SUCTIONS) ×2 IMPLANT
CANISTER WOUNDNEG PRESSURE 500 (CANNISTER) IMPLANT
COVER SURGICAL LIGHT HANDLE (MISCELLANEOUS) ×2 IMPLANT
DRAPE LAPAROSCOPIC ABDOMINAL (DRAPES) IMPLANT
DRAPE LAPAROTOMY 100X72 PEDS (DRAPES) IMPLANT
DRSG VAC GRANUFOAM LG (GAUZE/BANDAGES/DRESSINGS) IMPLANT
ELECTRODE REM PT RTRN 9FT ADLT (ELECTROSURGICAL) ×2 IMPLANT
GAUZE PAD ABD 8X10 STRL (GAUZE/BANDAGES/DRESSINGS) IMPLANT
GAUZE SPONGE 4X4 12PLY STRL (GAUZE/BANDAGES/DRESSINGS) IMPLANT
GLOVE BIO SURGEON STRL SZ 6.5 (GLOVE) ×2 IMPLANT
GLOVE BIOGEL PI IND STRL 6 (GLOVE) ×2 IMPLANT
GOWN STRL REUS W/ TWL LRG LVL3 (GOWN DISPOSABLE) ×4 IMPLANT
KIT BASIN OR (CUSTOM PROCEDURE TRAY) ×2 IMPLANT
KIT TURNOVER KIT B (KITS) ×2 IMPLANT
NS IRRIG 1000ML POUR BTL (IV SOLUTION) ×2 IMPLANT
PACK GENERAL/GYN (CUSTOM PROCEDURE TRAY) ×2 IMPLANT
PAD ARMBOARD POSITIONER FOAM (MISCELLANEOUS) ×2 IMPLANT
SOLN 0.9% NACL 3000 ML (IV SOLUTION) IMPLANT
SWAB COLLECTION DEVICE MRSA (MISCELLANEOUS) IMPLANT
SWAB CULTURE ESWAB REG 1ML (MISCELLANEOUS) IMPLANT
TIP FAN IRRIG PULSAVAC PLUS (DISPOSABLE) IMPLANT
TOWEL GREEN STERILE (TOWEL DISPOSABLE) ×2 IMPLANT
TOWEL GREEN STERILE FF (TOWEL DISPOSABLE) ×2 IMPLANT

## 2023-12-26 NOTE — Anesthesia Preprocedure Evaluation (Addendum)
 Anesthesia Evaluation  Patient identified by MRN, date of birth, ID band Patient awake    Reviewed: Allergy & Precautions, NPO status , Patient's Chart, lab work & pertinent test results  Airway Mallampati: II  TM Distance: >3 FB Neck ROM: Full    Dental no notable dental hx. (+) Teeth Intact, Dental Advisory Given   Pulmonary shortness of breath, sleep apnea (does not use CPAP) , Patient abstained from smoking., former smoker   Pulmonary exam normal breath sounds clear to auscultation       Cardiovascular hypertension, Normal cardiovascular exam Rhythm:Regular Rate:Normal     Neuro/Psych negative neurological ROS  negative psych ROS   GI/Hepatic negative GI ROS, Neg liver ROS,,,  Endo/Other    Class 3 obesity (BMI 46)  Renal/GU negative Renal ROS  negative genitourinary   Musculoskeletal negative musculoskeletal ROS (+)    Abdominal   Peds  Hematology  (+) Blood dyscrasia, anemia Lab Results      Component                Value               Date                      WBC                      7.9                 12/26/2023                HGB                      7.9 (L)             12/26/2023                HCT                      26.3 (L)            12/26/2023                MCV                      79.5 (L)            12/26/2023                PLT                      462 (H)             12/26/2023              Anesthesia Other Findings 43 yo female underwent giant hernia repair with bowel obstruction 12/08/23. Post op she had a large hematoma and received multiple blood transfusions. She presented to the office today and her wound was open with large amount of hematoma coming out the wound. She was direct admitted in order to undergo procedure tomorrow to remove hematoma, stop any bleeding, and place wound vac     Reproductive/Obstetrics                              Anesthesia  Physical Anesthesia Plan  ASA: 3  Anesthesia Plan: General   Post-op Pain Management:    Induction:  Intravenous  PONV Risk Score and Plan: 3 and Midazolam , Dexamethasone  and Ondansetron   Airway Management Planned: Oral ETT  Additional Equipment:   Intra-op Plan:   Post-operative Plan: Extubation in OR  Informed Consent: I have reviewed the patients History and Physical, chart, labs and discussed the procedure including the risks, benefits and alternatives for the proposed anesthesia with the patient or authorized representative who has indicated his/her understanding and acceptance.     Dental advisory given  Plan Discussed with: CRNA  Anesthesia Plan Comments:          Anesthesia Quick Evaluation

## 2023-12-26 NOTE — Anesthesia Postprocedure Evaluation (Signed)
 Anesthesia Post Note  Patient: Victoria Chavez  Procedure(s) Performed: DEBRIDEMENT OF ABDOMINAL WALL ABSCESS APPLICATION, WOUND VAC     Patient location during evaluation: PACU Anesthesia Type: General Level of consciousness: awake and alert Pain management: pain level controlled Vital Signs Assessment: post-procedure vital signs reviewed and stable Respiratory status: spontaneous breathing, nonlabored ventilation, respiratory function stable and patient connected to nasal cannula oxygen Cardiovascular status: blood pressure returned to baseline and stable Postop Assessment: no apparent nausea or vomiting Anesthetic complications: no   No notable events documented.  Last Vitals:  Vitals:   12/26/23 1547 12/26/23 1602  BP: (!) 152/99 (!) 149/90  Pulse: 89 87  Resp: (!) 8 15  Temp:    SpO2: 96% 95%    Last Pain:  Vitals:   12/26/23 1602  TempSrc:   PainSc: 0-No pain                 Joziah Dollins L Tully Mcinturff

## 2023-12-26 NOTE — Progress Notes (Signed)
 The patient is safe to undergo ct scan from pregnancy perspective

## 2023-12-26 NOTE — Progress Notes (Signed)
 Seen and examined. Dressing changed at bedside. Plan OR for washout, vac placement. Informed consent obtained after detailed explanation of risks/benefits.   Dreama GEANNIE Hanger, MD General and Trauma Surgery [facility] Surgery

## 2023-12-26 NOTE — Anesthesia Procedure Notes (Signed)
 Procedure Name: Intubation Date/Time: 12/26/2023 2:19 PM  Performed by: Cindie Donald CROME, CRNAPre-anesthesia Checklist: Patient identified, Emergency Drugs available, Suction available and Patient being monitored Patient Re-evaluated:Patient Re-evaluated prior to induction Oxygen Delivery Method: Circle System Utilized Preoxygenation: Pre-oxygenation with 100% oxygen Induction Type: IV induction Ventilation: Mask ventilation without difficulty Laryngoscope Size: Mac and 3 Grade View: Grade I Tube type: Oral Tube size: 7.0 mm Number of attempts: 1 Airway Equipment and Method: Stylet Placement Confirmation: ETT inserted through vocal cords under direct vision, positive ETCO2 and breath sounds checked- equal and bilateral Secured at: 21 cm Tube secured with: Tape Dental Injury: Teeth and Oropharynx as per pre-operative assessment

## 2023-12-26 NOTE — Plan of Care (Signed)
  Problem: Education: Goal: Knowledge of General Education information will improve Description: Including pain rating scale, medication(s)/side effects and non-pharmacologic comfort measures Outcome: Progressing   Problem: Health Behavior/Discharge Planning: Goal: Ability to manage health-related needs will improve Outcome: Progressing   Problem: Elimination: Goal: Will not experience complications related to bowel motility Outcome: Progressing   Problem: Pain Managment: Goal: General experience of comfort will improve and/or be controlled Outcome: Progressing

## 2023-12-26 NOTE — Transfer of Care (Signed)
 Immediate Anesthesia Transfer of Care Note  Patient: Victoria Chavez  Procedure(s) Performed: DEBRIDEMENT OF ABDOMINAL WALL ABSCESS APPLICATION, WOUND VAC  Patient Location: PACU  Anesthesia Type:General  Level of Consciousness: awake, alert , oriented, and patient cooperative  Airway & Oxygen Therapy: Patient Spontanous Breathing  Post-op Assessment: Report given to RN and Post -op Vital signs reviewed and stable  Post vital signs: Reviewed and stable  Last Vitals:  Vitals Value Taken Time  BP 145/97 1505  Temp    Pulse 93   Resp 17   SpO2 94%     Last Pain:  Vitals:   12/26/23 1258  TempSrc: Oral  PainSc:          Complications: No notable events documented.

## 2023-12-26 NOTE — Op Note (Signed)
   Operative Note   Date: 12/26/2023  Procedure: wound exploration and washout, application of negative pressure wound vac, 25x10x5cm  Pre-op diagnosis: subcutaneous hematoma Post-op diagnosis: same  Indication and clinical history: The patient is a 43 y.o. year old female with subcutaneous hematoma     Surgeon: Dreama GEANNIE Hanger, MD  Anesthesiologist: Niels, MD Anesthesia: General  Findings:  Specimen: none EBL: 15cc Drains/Implants: wound vac as above  Disposition: PACU - hemodynamically stable.  Description of procedure: The patient was positioned supine on the operating room table. General anesthetic induction and intubation were uneventful. Foley catheter insertion was performed and was atraumatic. Time-out was performed verifying correct patient, procedure, signature of informed consent, and administration of pre-operative antibiotics. The abdomen was prepped and draped in the usual sterile fashion.  The wound was irrigated with the pulse lavage and 3L of saline. Copious amount of old blood clot was evacuated. Hemostasis was confirmed. Two vac sponges were inserted into the wound and negative pressure applied until a good seal was obtained.   All sponge and instrument counts were correct at the conclusion of the procedure. The patient was awakened from anesthesia, extubated uneventfully, and transported to the PACU in good condition. There were no complications.     Dreama GEANNIE Hanger, MD General and Trauma Surgery [facility] Surgery

## 2023-12-26 NOTE — Consult Note (Signed)
 WOC team received consult for NPWT dressing change 12/30/2023.  Patient with subcutaneous hematoma, wound exploration and washout and application of NPWT by Dr. Paola 12/26/2023.   WOC team will follow Monday 12/30/2023 for initial NPWT dressing change.   Thank you,    Powell Bar MSN, RN-BC, [employer]

## 2023-12-27 NOTE — Plan of Care (Signed)
   Problem: Activity: Goal: Risk for activity intolerance will decrease Outcome: Progressing   Problem: Nutrition: Goal: Adequate nutrition will be maintained Outcome: Progressing   Problem: Pain Managment: Goal: General experience of comfort will improve and/or be controlled Outcome: Progressing

## 2023-12-27 NOTE — Progress Notes (Signed)
 Wound vac output this shift 100cc

## 2023-12-28 MED ORDER — DOCUSATE SODIUM 100 MG PO CAPS
100.0000 mg | ORAL_CAPSULE | Freq: Two times a day (BID) | ORAL | Status: DC | PRN
  Administered 2023-12-28: 100 mg via ORAL
  Filled 2023-12-28: qty 1

## 2023-12-28 MED ORDER — DOCUSATE SODIUM 100 MG PO CAPS: 100.0000 mg | ORAL_CAPSULE | Freq: Two times a day (BID) | ORAL | Status: DC

## 2023-12-28 NOTE — Progress Notes (Signed)
   Progress Note  2 Days Post-Op  Subjective: Patient reports pain is manageable. She had a small BM today. Reports flatulence. Tolerating regular diet without nausea and vomiting.   ROS  All negative with the exception of above.  Objective: Vital signs in last 24 hours: Temp:  [98.2 F (36.8 C)-98.5 F (36.9 C)] 98.2 F (36.8 C) (08/31 0825) Pulse Rate:  [81-100] 81 (08/31 0825) Resp:  [16-20] 17 (08/31 0825) BP: (139-144)/(76-94) 142/76 (08/31 0825) SpO2:  [97 %-100 %] 97 % (08/31 0825) Last BM Date : 12/27/23  Intake/Output from previous day: No intake/output data recorded. Intake/Output this shift: No intake/output data recorded.  PE: General: Pleasant female who is laying in bed in NAD. Lungs: Respiratory effort nonlabored. Abd: Soft, NT, ND. Midline wound present with vac in place. No rebound tenderness or guarding.  Psych: A&Ox3 with an appropriate affect.    Lab Results:  Recent Labs    12/26/23 0213  WBC 7.9  HGB 7.9*  HCT 26.3*  PLT 462*   BMET Recent Labs    12/26/23 0213  NA 137  K 3.3*  CL 104  CO2 25  GLUCOSE 115*  BUN <5*  CREATININE 0.63  CALCIUM 8.4*   PT/INR No results for input(s): LABPROT, INR in the last 72 hours. CMP     Component Value Date/Time   NA 137 12/26/2023 0213   K 3.3 (L) 12/26/2023 0213   CL 104 12/26/2023 0213   CO2 25 12/26/2023 0213   GLUCOSE 115 (H) 12/26/2023 0213   BUN <5 (L) 12/26/2023 0213   CREATININE 0.63 12/26/2023 0213   CALCIUM 8.4 (L) 12/26/2023 0213   PROT 7.0 12/06/2023 0240   ALBUMIN  2.9 (L) 12/06/2023 0240   AST 12 (L) 12/06/2023 0240   ALT 12 12/06/2023 0240   ALKPHOS 68 12/06/2023 0240   BILITOT 0.2 12/06/2023 0240   GFRNONAA >60 12/26/2023 0213   GFRAA >60 10/28/2019 0422   Lipase     Component Value Date/Time   LIPASE 25 12/05/2023 1014       Studies/Results: No results found.  Anti-infectives: Anti-infectives (From admission, onward)    None         Assessment/Plan POD2: S/P wound exploration and washout, application of negative pressure wound vac by Dr. Paola on 12/26/2023 -Mild HTN. Afebrile. Stable. -Tolerating regular diet. Had small BM today. Reports flatus. Discussed providing colace for patient to help with Bms.  -Wound vac in place. Will plan for exchange on 9/2  FEN: Regular VTE: SCDs ID: None currently    LOS: 3 days   I reviewed nursing notes, last 24 h vitals and pain scores, last 48 h intake and output, last 24 h labs and trends, and last 24 h imaging results.   Marjorie Carlyon Favre, [facility] Surgery 12/28/2023, 10:05 AM Please see Amion for pager number during day hours 7:00am-4:30pm

## 2023-12-28 NOTE — Plan of Care (Signed)

## 2023-12-28 NOTE — Plan of Care (Signed)
  Problem: Activity: Goal: Risk for activity intolerance will decrease Outcome: Progressing   Problem: Nutrition: Goal: Adequate nutrition will be maintained Outcome: Progressing   Problem: Coping: Goal: Level of anxiety will decrease Outcome: Progressing   Problem: Pain Managment: Goal: General experience of comfort will improve and/or be controlled Outcome: Progressing

## 2023-12-29 LAB — BASIC METABOLIC PANEL WITH GFR
Anion gap: 7 (ref 5–15)
BUN: 6 mg/dL (ref 6–20)
CO2: 27 mmol/L (ref 22–32)
Calcium: 8.8 mg/dL — ABNORMAL LOW (ref 8.9–10.3)
Chloride: 104 mmol/L (ref 98–111)
Creatinine, Ser: 0.61 mg/dL (ref 0.44–1.00)
GFR, Estimated: >60 mL/min (ref >60)
Glucose, Bld: 101 mg/dL — ABNORMAL HIGH (ref 70–99)
Potassium: 3.5 mmol/L (ref 3.5–5.1)
Sodium: 138 mmol/L (ref 135–145)

## 2023-12-29 LAB — CBC
HCT: 31.2 % — ABNORMAL LOW (ref 36.0–46.0)
Hemoglobin: 9.3 g/dL — ABNORMAL LOW (ref 12.0–15.0)
MCH: 23.7 pg — ABNORMAL LOW (ref 26.0–34.0)
MCHC: 29.8 g/dL — ABNORMAL LOW (ref 30.0–36.0)
MCV: 79.4 fL — ABNORMAL LOW (ref 80.0–100.0)
Platelets: 710 K/uL — ABNORMAL HIGH (ref 150–400)
RBC: 3.93 MIL/uL (ref 3.87–5.11)
RDW: 18.1 % — ABNORMAL HIGH (ref 11.5–15.5)
WBC: 7.5 K/uL (ref 4.0–10.5)
nRBC: 0 % (ref 0.0–0.2)

## 2023-12-29 NOTE — Plan of Care (Signed)
  Problem: Clinical Measurements: Goal: Ability to maintain clinical measurements within normal limits will improve Outcome: Progressing Goal: Will remain free from infection Outcome: Progressing Goal: Diagnostic test results will improve Outcome: Progressing   Problem: Activity: Goal: Risk for activity intolerance will decrease Outcome: Progressing   Problem: Nutrition: Goal: Adequate nutrition will be maintained Outcome: Progressing   Problem: Coping: Goal: Level of anxiety will decrease Outcome: Progressing   Problem: Pain Managment: Goal: General experience of comfort will improve and/or be controlled Outcome: Progressing

## 2023-12-29 NOTE — Progress Notes (Signed)
   Progress Note  3 Days Post-Op  Subjective: Patient's pain is well controlled. Having BM and flatus. Denies nausea vomiting. Tolerating reg diet.   ROS  All negative with the exception of above.  Objective: Vital signs in last 24 hours: Temp:  [98.1 F (36.7 C)-98.3 F (36.8 C)] 98.1 F (36.7 C) (09/01 0826) Pulse Rate:  [82-96] 82 (09/01 0826) Resp:  [18] 18 (09/01 0826) BP: (121-138)/(69-89) 138/69 (09/01 0826) SpO2:  [97 %-99 %] 99 % (09/01 0826) Last BM Date : 12/29/23  Intake/Output from previous day: 08/31 0701 - 09/01 0700 In: 1200 [P.O.:1200] Out: -  Intake/Output this shift: Total I/O In: 360 [P.O.:360] Out: -   PE: General: Pleasant female who is laying in bed in NAD. Lungs: Respiratory effort nonlabored. Abd: Soft, NT, ND. Midline wound present with vac in place. No rebound tenderness or guarding.  Psych: A&Ox3 with an appropriate affect.    Lab Results:  No results for input(s): WBC, HGB, HCT, PLT in the last 72 hours. BMET No results for input(s): NA, K, CL, CO2, GLUCOSE, BUN, CREATININE, CALCIUM in the last 72 hours. PT/INR No results for input(s): LABPROT, INR in the last 72 hours. CMP     Component Value Date/Time   NA 137 12/26/2023 0213   K 3.3 (L) 12/26/2023 0213   CL 104 12/26/2023 0213   CO2 25 12/26/2023 0213   GLUCOSE 115 (H) 12/26/2023 0213   BUN <5 (L) 12/26/2023 0213   CREATININE 0.63 12/26/2023 0213   CALCIUM 8.4 (L) 12/26/2023 0213   PROT 7.0 12/06/2023 0240   ALBUMIN  2.9 (L) 12/06/2023 0240   AST 12 (L) 12/06/2023 0240   ALT 12 12/06/2023 0240   ALKPHOS 68 12/06/2023 0240   BILITOT 0.2 12/06/2023 0240   GFRNONAA >60 12/26/2023 0213   GFRAA >60 10/28/2019 0422   Lipase     Component Value Date/Time   LIPASE 25 12/05/2023 1014       Studies/Results: No results found.  Anti-infectives: Anti-infectives (From admission, onward)    None        Assessment/Plan POD3: S/P wound  exploration and washout, application of negative pressure wound vac by Dr. Paola on 12/26/2023 -Afebrile. Stable. -labs pending for today -Tolerating regular diet. Having bowel function/flatus.  -Wound vac in place. Will plan for exchange on 9/2. Planning for Tues/Friday -Placed TOC orders to arrange for at home wound vac   FEN: Regular VTE: SCDs ID: None currently    LOS: 4 days   I reviewed nursing notes, last 24 h vitals and pain scores, last 48 h intake and output, last 24 h labs and trends, and last 24 h imaging results.   Marjorie Carlyon Favre, [facility] Surgery 12/29/2023, 11:29 AM Please see Amion for pager number during day hours 7:00am-4:30pm

## 2023-12-29 NOTE — Consult Note (Signed)
 WOC consulted for NPWT; bedside staff report skill set to change; MD aware.   Inis Borneman [facility], CNS, CWON-AP [phone]

## 2023-12-30 ENCOUNTER — Encounter (HOSPITAL_COMMUNITY): Payer: Self-pay | Admitting: Surgery

## 2023-12-30 MED ORDER — OXYCODONE HCL 5 MG PO TABS: 5.0000 mg | ORAL_TABLET | Freq: Four times a day (QID) | ORAL | 0 refills | Status: AC | PRN

## 2023-12-30 MED ORDER — MORPHINE SULFATE (PF) 2 MG/ML IV SOLN
2.0000 mg | Freq: Once | INTRAVENOUS | Status: AC
  Administered 2023-12-30: 2 mg via INTRAVENOUS
  Filled 2023-12-30: qty 1

## 2023-12-30 NOTE — Progress Notes (Signed)
 Victoria Chavez to be D/C'd  per MD order.  Discussed with the patient and all questions fully answered.  VSS, Skin clean, dry and intact without evidence of skin break down, no evidence of skin tears noted.  IV catheter discontinued intact. Site without signs and symptoms of complications. Dressing and pressure applied.  An After Visit Summary was printed and given to the patient. Patient instructed to pick up prescription at preferred pharmacy listed on AVS.  D/c education completed with patient/family including follow up instructions, medication list, d/c activities limitations if indicated, with other d/c instructions as indicated by MD - patient able to verbalize understanding, all questions fully answered.   Patient instructed to return to ED, call 911, or call MD for any changes in condition.   Patient to be escorted via WC, and D/C home via private auto.

## 2023-12-30 NOTE — Discharge Summary (Signed)
 Patient ID: Victoria Chavez [id number] [birth year] 43 y.o.  Admit date: 12/25/2023 Discharge date: 12/30/2023  Admitting Diagnosis: large hematoma and giant hernia repair   Discharge Diagnosis Patient Active Problem List   Diagnosis Date Noted   Abdominal hematoma 12/25/2023   SBO (small bowel obstruction) (HCC) 12/05/2023   Tubal ovarian abscess 11/22/2023   Sepsis (HCC) 11/22/2023   PID (acute pelvic inflammatory disease) 11/22/2023   Hypokalemia 11/22/2023   History of essential hypertension 11/22/2023   B12 deficiency 10/07/2023   Chronic iron  deficiency anemia 09/04/2023   Gonorrhea 10/28/2019   Ileus (HCC) 10/27/2019   Pelvic abscess in female 10/26/2019   Trichomoniasis 10/26/2019   Anemia 10/26/2019   Incarcerated umbilical hernia 08/12/2019   Morbid obesity (HCC) 02/18/2017   Ventral hernia 02/18/2017   Tobacco user 02/18/2017  POD 4, S/P wound exploration and washout, application of negative pressure wound vac by Dr. Paola on 12/26/2023   Consultants none  Reason for Admission: 43 yo female underwent giant hernia repair with bowel obstruction. Post op she had a large hematoma and received multiple blood transfusions. She presented to the office today and her wound was open with large amount of hematoma coming out the wound. She was direct admitted in order to undergo procedure tomorrow to remove hematoma, stop any bleeding, and place wound vac   Procedures Dr. Paola, 12/26/2023 Wound exploration and washout, application of negative pressure wound vac   Hospital Course:  The patient was admitted and underwent the above procedure.  She remained inpatient through the weekend for her first VAC change.  This went very well.  Her sponge did get cut into 2 pieces, but these are sutured together to allow for the superior sponge to squish well enough to pack in the area of undermining.  This was completed and the rest of the wound packed with the remaining large  sponge.  Her wound is 100% clean.  It does have some malodor and therefore, irrigated, but no necrotic tissue or purulent drainage.  She was stable for DC home with HH arranged and VAC approved.  Allergies as of 12/30/2023   No Known Allergies      Medication List     TAKE these medications    acetaminophen  500 MG tablet Commonly known as: TYLENOL  Take 1,000 mg by mouth 2 (two) times daily as needed for moderate pain (pain score 4-6), fever or headache.   albuterol  108 (90 Base) MCG/ACT inhaler Commonly known as: VENTOLIN  HFA Inhale 2 puffs into the lungs every 6 (six) hours as needed for shortness of breath. For shortness of breath   amLODipine  5 MG tablet Commonly known as: NORVASC  Take 1 tablet (5 mg total) by mouth daily.   cyanocobalamin  1000 MCG/ML injection Commonly known as: VITAMIN B12 Inject 1 mL (1,000 mcg total) into the muscle once a week for 35 days, THEN 1 mL (1,000 mcg total) every 30 (thirty) days. Start taking on: October 15, 2023   docusate sodium  100 MG capsule Commonly known as: COLACE Take 1 capsule (100 mg total) by mouth 2 (two) times daily.   ferrous sulfate 325 (65 FE) MG tablet Take 325 mg by mouth daily.   ibuprofen  800 MG tablet Commonly known as: ADVIL  Take 1 tablet (800 mg total) by mouth every 8 (eight) hours as needed for mild pain (pain score 1-3).   oxyCODONE  5 MG immediate release tablet Commonly known as: Oxy IR/ROXICODONE  Take 1 tablet (5 mg total) by mouth every 6 (  six) hours as needed (VAC change pain). What changed: reasons to take this   pantoprazole  40 MG tablet Commonly known as: PROTONIX  Take 1 tablet (40 mg total) by mouth daily.   polyethylene glycol 17 g packet Commonly known as: MIRALAX  / GLYCOLAX  Take 17 g by mouth daily as needed for mild constipation.   simethicone  80 MG chewable tablet Commonly known as: MYLICON Chew 1 tablet (80 mg total) by mouth 4 (four) times daily as needed for flatulence.   spironolactone   25 MG tablet Commonly known as: ALDACTONE  Take 25 mg by mouth daily.          Follow-up Information     [facility], [facility]  Follow up.   Contact [address] [place] [address] [phone]         Stechschulte, Deward PARAS, MD Follow up on 01/08/2024.   Specialty: Surgery Why: 9:30am, Arrive 15 minutes prior to your appointment time, Please bring your insurance card and photo ID Contact information: [address] [place] [address] [phone]                 Signed: Burnard Banter, [facility] 12/30/2023, 10:30 AM Please see Amion for pager number during day hours 7:00am-4:30pm, 7-11:30am on Weekends

## 2023-12-30 NOTE — Progress Notes (Signed)
 Unable to scan patient and medications due to scanner not working. Per patient scanner hasn't been working properly since being in the room. Called IT to report the issue. Spoke with Vinie work order #[id number]

## 2023-12-30 NOTE — Plan of Care (Signed)

## 2023-12-30 NOTE — Consult Note (Signed)
 WOC consulted for NPWT dressing, however CCS PA at bedside for dressing change, 2 black foam sponges had been sutured together. See CCS note from today, dressing replaced by Burnard KIDD. 2 black sponges used and sutured together again to facilitate removal of both.  I will provide images of a way to cut foam after discussion with Burnard KIDD. And take a picture for the chart as well.

## 2023-12-30 NOTE — Plan of Care (Signed)
   Problem: Clinical Measurements: Goal: Will remain free from infection Outcome: Progressing   Problem: Pain Managment: Goal: General experience of comfort will improve and/or be controlled Outcome: Progressing   Problem: Safety: Goal: Ability to remain free from injury will improve Outcome: Progressing

## 2023-12-30 NOTE — Consult Note (Signed)
 HHRN There are two pieces of black foam in the wound currently from the dressing change 9/2; they are sutured together to allow for removal   See media tab on how you may want to cut the foam the next dressing change to allow for one piece of foam to be used.   Thanks Jaileen Janelle [employer], CNS, [employer] [phone]

## 2023-12-30 NOTE — Progress Notes (Signed)
 Patient negative pressure wound vac switched from hospital to home wound vac and reinforced patient education. Patient says she understands and does not have any additional questions or concerns.

## 2023-12-30 NOTE — Progress Notes (Signed)
   Progress Note  4 Days Post-Op  Subjective: Patient's pain is well controlled. Having BM and flatus. Denies nausea vomiting. Tolerating reg diet.    Objective: Vital signs in last 24 hours: Temp:  [98 F (36.7 C)-98.6 F (37 C)] 98 F (36.7 C) (09/02 0822) Pulse Rate:  [86-98] 86 (09/02 0822) Resp:  [18-19] 18 (09/02 0822) BP: (111-143)/(69-91) 140/85 (09/02 0822) SpO2:  [95 %-98 %] 98 % (09/02 0822) Last BM Date : 12/29/23  Intake/Output from previous day: 09/01 0701 - 09/02 0700 In: 1320 [P.O.:1320] Out: 350 [Drains:350] Intake/Output this shift: No intake/output data recorded.  PE: General: Pleasant female who is laying in bed in NAD. Lungs: Respiratory effort nonlabored. Abd: Soft, Midline VAC removed.  Measurements as time of surgery 25x10x5cm.  Large sponge and half a large sponge sutured together were removed from wound.  Malodorous but no purulent drainage or necrotic tissue.  Wound was irrigated with saline.  Significant portion of wound is undermining and not able to be seen in photo below.  Wound explored with my hand and no pockets or necrotic tissue noted.  Wound VAC replaced. A large sponge was cut in 2 and sutured together to allow for the superior sponge to be squished enough to fit in the tight space of the undermined tissue.  The remaining portion of the large sponge does fit in the visible base part of the wound noted above in the photo.   Lab Results:  Recent Labs    12/29/23 1151  WBC 7.5  HGB 9.3*  HCT 31.2*  PLT 710*   BMET Recent Labs    12/29/23 1151  NA 138  K 3.5  CL 104  CO2 27  GLUCOSE 101*  BUN 6  CREATININE 0.61  CALCIUM 8.8*   PT/INR No results for input(s): LABPROT, INR in the last 72 hours. CMP     Component Value Date/Time   NA 138 12/29/2023 1151   K 3.5 12/29/2023 1151   CL 104 12/29/2023 1151   CO2 27 12/29/2023 1151   GLUCOSE 101 (H) 12/29/2023 1151   BUN 6 12/29/2023 1151   CREATININE 0.61 12/29/2023  1151   CALCIUM 8.8 (L) 12/29/2023 1151   PROT 7.0 12/06/2023 0240   ALBUMIN  2.9 (L) 12/06/2023 0240   AST 12 (L) 12/06/2023 0240   ALT 12 12/06/2023 0240   ALKPHOS 68 12/06/2023 0240   BILITOT 0.2 12/06/2023 0240   GFRNONAA >60 12/29/2023 1151   GFRAA >60 10/28/2019 0422   Lipase     Component Value Date/Time   LIPASE 25 12/05/2023 1014       Studies/Results: No results found.  Anti-infectives: Anti-infectives (From admission, onward)    None        Assessment/Plan POD 4, S/P wound exploration and washout, application of negative pressure wound vac by Dr. Paola on 12/26/2023 -Afebrile. Stable. -AF, VSS, WBC normal -Tolerating regular diet. Having bowel function/flatus.  -Wound vac changed today.  Will plan T/F changes -Placed TOC orders to arrange for at home wound vac.  If this gets approved, will plan for DC home later today.   FEN: Regular VTE: SCDs ID: None currently    LOS: 5 days    Burnard FORBES Banter, [facility] 12/30/2023, 10:14 AM Please see Amion for pager number during day hours 7:00am-4:30pm

## 2023-12-30 NOTE — TOC Initial Note (Addendum)
 Transition of Care (TOC) - Initial/Assessment Note   Patient from home by self. Has sister and brother close by if assistance needed.   Plan to discharge to home today with home negative wound pressure system and home health RN to change negative wound pressure system every Tuesday and Friday. It has been changed today.    NCM called following agencies :   Burnard with [facility] unable to accept due to insurance.   [facility] unable to accept.  Lynette with [facility] cannot accept  Left Brianna at [facility] a message.   Cheryl with [facility] unable to accept   [facility] with [facility] unable to accept  Jon with [facility] unable to accept   Rexene with [facility] unable to accept  Calvin with [facility] unable to accept.   [facility] unable to accept   Artvia with [facility] accepted. Patient aware   [facility] will call patient to arrange a time to change dressing Friday.   NCM submitted signed prescription for home negative wound pressure system to [place] with [facility]. Once insurance approval received. NCM will deliver home negative to bedside and hospital nurse will connect prior to discharge.   Home pump much smaller then hospital pump and patient will have some supplies to take home for [name]. [facility] will ship more supplies to her address.   Patient voiced understanding   VAC approved and delivered to patient room   [facility] aware discharge today  Patient Details  Name: Victoria Chavez MRN: [id number] Date of Birth: [birth year]  Transition of Care [facility]) CM/SW Contact:    Stephane Powell Earnie, RN Phone Number: 12/30/2023, 9:15 AM  Clinical Narrative:                   Expected Discharge Plan: Home w Home Health Services     Patient Goals and CMS Choice Patient states their goals for this hospitalization and ongoing recovery are:: to return to home CMS Medicare.gov Compare Post Acute Care list provided to:: Patient Choice offered to / list presented to : Patient       Expected Discharge Plan and Services   Discharge Planning Services: CM Consult Post Acute Care Choice: Home Health, Durable Medical Equipment Living arrangements for the past 2 months: Apartment                 DME Arranged: Vac DME Agency:  [facility]) Date DME Agency Contacted: 12/30/23 Time DME Agency Contacted: [phone] Representative spoke with at DME Agency: [facility] HH Arranged: RN HH Agency: [facility] Date HH Agency Contacted: 12/30/23 Time HH Agency Contacted: [phone] Representative spoke with at [facility] Agency: [facility]  Prior Living Arrangements/Services Living arrangements for the past 2 months: Apartment Lives with:: Self Patient language and need for interpreter reviewed:: Yes Do you feel safe going back to the place where you live?: Yes      Need for Family Participation in Patient Care: Yes (Comment) Care giver support system in place?: Yes (comment)   Criminal Activity/Legal Involvement Pertinent to Current Situation/Hospitalization: No - Comment as needed  Activities of Daily Living   ADL Screening (condition at time of admission) Independently performs ADLs?: Yes (appropriate for developmental age) Is the patient deaf or have difficulty hearing?: No Does the patient have difficulty seeing, even when wearing glasses/contacts?: No Does the patient have difficulty concentrating, remembering, or making decisions?: No  Permission Sought/Granted   Permission granted to share information with : Yes, Verbal Permission Granted     Permission granted to share  info w AGENCY: [facility] , home health agencies        Emotional Assessment Appearance:: Appears stated age Attitude/Demeanor/Rapport: Engaged Affect (typically observed): Appropriate Orientation: : Oriented to Self, Oriented to Place, Oriented to  Time, Oriented to Situation Alcohol / Substance Use: Not Applicable Psych Involvement: No (comment)  Admission diagnosis:  Abdominal hematoma  [S30.1XXA] Patient Active Problem List   Diagnosis Date Noted   Abdominal hematoma 12/25/2023   SBO (small bowel obstruction) (HCC) 12/05/2023   Tubal ovarian abscess 11/22/2023   Sepsis (HCC) 11/22/2023   PID (acute pelvic inflammatory disease) 11/22/2023   Hypokalemia 11/22/2023   History of essential hypertension 11/22/2023   B12 deficiency 10/07/2023   Chronic iron  deficiency anemia 09/04/2023   Gonorrhea 10/28/2019   Ileus (HCC) 10/27/2019   Pelvic abscess in female 10/26/2019   Trichomoniasis 10/26/2019   Anemia 10/26/2019   Incarcerated umbilical hernia 08/12/2019   Morbid obesity (HCC) 02/18/2017   Ventral hernia 02/18/2017   Tobacco user 02/18/2017   PCP:  Ilah Crigler, MD Pharmacy:   [facility] [phone] - [place], [place] - [address] AT [facility] OF [address] [place] [address] Phone: [phone] Fax: [phone]     Social Drivers of Health (SDOH) Social History: SDOH Screenings   Food Insecurity: Food Insecurity Present (12/26/2023)  Housing: High Risk (12/26/2023)  Transportation Needs: No Transportation Needs (12/26/2023)  Utilities: Not At Risk (12/26/2023)  Depression (PHQ2-9): Low Risk  (12/03/2023)  Tobacco Use: Medium Risk (12/26/2023)   SDOH Interventions:     Readmission Risk Interventions    12/25/2023    3:49 PM 11/26/2023   12:01 PM  Readmission Risk Prevention Plan  Post Dischage Appt Complete Complete  Medication Screening Complete Complete  Transportation Screening Complete Complete

## 2024-02-04 ENCOUNTER — Inpatient Hospital Stay: Attending: Hematology

## 2024-03-15 ENCOUNTER — Other Ambulatory Visit: Payer: Self-pay | Admitting: Surgery

## 2024-03-15 DIAGNOSIS — Z9889 Other specified postprocedural states: Secondary | ICD-10-CM

## 2024-03-22 ENCOUNTER — Ambulatory Visit
Admission: RE | Admit: 2024-03-22 | Discharge: 2024-03-22 | Disposition: A | Discharge status: Home / Self Care | Source: Ambulatory Visit | Attending: Surgery | Admitting: Surgery

## 2024-03-22 DIAGNOSIS — Z9889 Other specified postprocedural states: Secondary | ICD-10-CM

## 2024-03-22 MED ORDER — IOPAMIDOL (ISOVUE-300) INJECTION 61%
100.0000 mL | Freq: Once | INTRAVENOUS | Status: AC | PRN
  Administered 2024-03-22: 100 mL via INTRAVENOUS

## 2024-04-07 ENCOUNTER — Inpatient Hospital Stay: Attending: Hematology

## 2024-04-07 DIAGNOSIS — D500 Iron deficiency anemia secondary to blood loss (chronic): Secondary | ICD-10-CM | POA: Insufficient documentation

## 2024-04-07 DIAGNOSIS — N920 Excessive and frequent menstruation with regular cycle: Secondary | ICD-10-CM | POA: Insufficient documentation

## 2024-04-07 DIAGNOSIS — E538 Deficiency of other specified B group vitamins: Secondary | ICD-10-CM | POA: Insufficient documentation

## 2024-04-12 ENCOUNTER — Inpatient Hospital Stay

## 2024-04-12 DIAGNOSIS — D500 Iron deficiency anemia secondary to blood loss (chronic): Secondary | ICD-10-CM | POA: Diagnosis present

## 2024-04-12 DIAGNOSIS — N920 Excessive and frequent menstruation with regular cycle: Secondary | ICD-10-CM | POA: Diagnosis present

## 2024-04-12 DIAGNOSIS — E538 Deficiency of other specified B group vitamins: Secondary | ICD-10-CM | POA: Diagnosis present

## 2024-04-12 DIAGNOSIS — D649 Anemia, unspecified: Secondary | ICD-10-CM

## 2024-04-12 LAB — CBC WITH DIFFERENTIAL/PLATELET
Abs Immature Granulocytes: 0.03 K/uL (ref 0.00–0.07)
Basophils Absolute: 0.1 K/uL (ref 0.0–0.1)
Basophils Relative: 1 %
Eosinophils Absolute: 0.3 K/uL (ref 0.0–0.5)
Eosinophils Relative: 4 %
HCT: 38.9 % (ref 36.0–46.0)
Hemoglobin: 12.1 g/dL (ref 12.0–15.0)
Immature Granulocytes: 0 %
Lymphocytes Relative: 28 %
Lymphs Abs: 2.4 K/uL (ref 0.7–4.0)
MCH: 22.7 pg — ABNORMAL LOW (ref 26.0–34.0)
MCHC: 31.1 g/dL (ref 30.0–36.0)
MCV: 72.8 fL — ABNORMAL LOW (ref 80.0–100.0)
Monocytes Absolute: 0.8 K/uL (ref 0.1–1.0)
Monocytes Relative: 9 %
Neutro Abs: 4.9 K/uL (ref 1.7–7.7)
Neutrophils Relative %: 58 %
Platelets: 321 K/uL (ref 150–400)
RBC: 5.34 MIL/uL — ABNORMAL HIGH (ref 3.87–5.11)
RDW: 17.9 % — ABNORMAL HIGH (ref 11.5–15.5)
WBC: 8.5 K/uL (ref 4.0–10.5)
nRBC: 0 % (ref 0.0–0.2)

## 2024-04-12 LAB — FERRITIN: Ferritin: 82 ng/mL (ref 11–307)

## 2024-04-12 LAB — VITAMIN B12: Vitamin B-12: 321 pg/mL (ref 180–914)

## 2024-04-21 ENCOUNTER — Encounter: Payer: Self-pay | Admitting: Hematology

## 2024-06-02 ENCOUNTER — Encounter: Payer: Self-pay | Admitting: Nurse Practitioner

## 2024-06-09 ENCOUNTER — Inpatient Hospital Stay: Attending: Hematology

## 2024-06-09 ENCOUNTER — Inpatient Hospital Stay: Admitting: Hematology
